# Patient Record
Sex: Male | Born: 1940 | Race: White | Hispanic: No | Marital: Married | Smoking: Former smoker
Health system: Southern US, Community
[De-identification: ages and names within clinical notes are randomized; demographics above are authoritative.]

## PROBLEM LIST (undated history)

## (undated) DIAGNOSIS — B351 Tinea unguium: Secondary | ICD-10-CM

## (undated) DIAGNOSIS — D649 Anemia, unspecified: Secondary | ICD-10-CM

## (undated) DIAGNOSIS — R310 Gross hematuria: Secondary | ICD-10-CM

## (undated) DIAGNOSIS — E1140 Type 2 diabetes mellitus with diabetic neuropathy, unspecified: Secondary | ICD-10-CM

## (undated) DIAGNOSIS — G4730 Sleep apnea, unspecified: Secondary | ICD-10-CM

## (undated) DIAGNOSIS — F329 Major depressive disorder, single episode, unspecified: Secondary | ICD-10-CM

## (undated) DIAGNOSIS — B359 Dermatophytosis, unspecified: Secondary | ICD-10-CM

## (undated) DIAGNOSIS — I10 Essential (primary) hypertension: Secondary | ICD-10-CM

## (undated) DIAGNOSIS — G20 Parkinson's disease: Secondary | ICD-10-CM

## (undated) DIAGNOSIS — E119 Type 2 diabetes mellitus without complications: Secondary | ICD-10-CM

## (undated) DIAGNOSIS — R0600 Dyspnea, unspecified: Secondary | ICD-10-CM

## (undated) DIAGNOSIS — L859 Epidermal thickening, unspecified: Secondary | ICD-10-CM

## (undated) DIAGNOSIS — F319 Bipolar disorder, unspecified: Secondary | ICD-10-CM

## (undated) DIAGNOSIS — F32A Depression, unspecified: Secondary | ICD-10-CM

## (undated) DIAGNOSIS — G20A1 Parkinson's disease without dyskinesia, without mention of fluctuations: Secondary | ICD-10-CM

## (undated) DIAGNOSIS — M109 Gout, unspecified: Secondary | ICD-10-CM

## (undated) DIAGNOSIS — C61 Malignant neoplasm of prostate: Secondary | ICD-10-CM

## (undated) DIAGNOSIS — E785 Hyperlipidemia, unspecified: Secondary | ICD-10-CM

## (undated) DIAGNOSIS — K529 Noninfective gastroenteritis and colitis, unspecified: Secondary | ICD-10-CM

## (undated) DIAGNOSIS — M1990 Unspecified osteoarthritis, unspecified site: Secondary | ICD-10-CM

## (undated) DIAGNOSIS — R251 Tremor, unspecified: Secondary | ICD-10-CM

## (undated) DIAGNOSIS — K635 Polyp of colon: Secondary | ICD-10-CM

## (undated) HISTORY — DX: Dermatophytosis, unspecified: B35.9

## (undated) HISTORY — DX: Bipolar disorder, unspecified: F31.9

## (undated) HISTORY — DX: Hyperlipidemia, unspecified: E78.5

## (undated) HISTORY — DX: Type 2 diabetes mellitus without complications: E11.9

## (undated) HISTORY — DX: Noninfective gastroenteritis and colitis, unspecified: K52.9

## (undated) HISTORY — DX: Tinea unguium: B35.1

## (undated) HISTORY — DX: Gross hematuria: R31.0

## (undated) HISTORY — DX: Polyp of colon: K63.5

## (undated) HISTORY — DX: Sleep apnea, unspecified: G47.30

## (undated) HISTORY — DX: Type 2 diabetes mellitus with diabetic neuropathy, unspecified: E11.40

## (undated) HISTORY — DX: Epidermal thickening, unspecified: L85.9

## (undated) HISTORY — DX: Unspecified osteoarthritis, unspecified site: M19.90

## (undated) HISTORY — DX: Tremor, unspecified: R25.1

## (undated) HISTORY — PX: FOOT SURGERY: SHX648

## (undated) HISTORY — PX: PROSTATE SURGERY: SHX751

## (undated) HISTORY — DX: Parkinson's disease without dyskinesia, without mention of fluctuations: G20.A1

## (undated) HISTORY — DX: Dyspnea, unspecified: R06.00

## (undated) HISTORY — DX: Parkinson's disease: G20

## (undated) HISTORY — DX: Essential (primary) hypertension: I10

## (undated) HISTORY — DX: Gout, unspecified: M10.9

## (undated) HISTORY — DX: Depression, unspecified: F32.A

## (undated) HISTORY — DX: Major depressive disorder, single episode, unspecified: F32.9

## (undated) HISTORY — PX: COLONOSCOPY W/ BIOPSIES: SHX1374

## (undated) HISTORY — PX: OTHER SURGICAL HISTORY: SHX169

## (undated) HISTORY — DX: Anemia, unspecified: D64.9

## (undated) HISTORY — PX: TONSILLECTOMY: SUR1361

## (undated) HISTORY — DX: Malignant neoplasm of prostate: C61

---

## 2004-04-03 ENCOUNTER — Encounter: Payer: Self-pay | Admitting: Urology

## 2004-09-19 ENCOUNTER — Ambulatory Visit: Payer: Self-pay | Admitting: Radiation Oncology

## 2004-10-01 ENCOUNTER — Ambulatory Visit: Payer: Self-pay | Admitting: Radiation Oncology

## 2005-06-07 ENCOUNTER — Ambulatory Visit: Payer: Self-pay | Admitting: Unknown Physician Specialty

## 2005-09-19 ENCOUNTER — Ambulatory Visit: Payer: Self-pay | Admitting: Radiation Oncology

## 2005-10-01 ENCOUNTER — Ambulatory Visit: Payer: Self-pay | Admitting: Radiation Oncology

## 2005-11-13 ENCOUNTER — Ambulatory Visit: Payer: Self-pay | Admitting: Ophthalmology

## 2005-11-19 ENCOUNTER — Ambulatory Visit: Payer: Self-pay | Admitting: Ophthalmology

## 2006-02-01 DIAGNOSIS — R251 Tremor, unspecified: Secondary | ICD-10-CM | POA: Insufficient documentation

## 2006-09-18 ENCOUNTER — Ambulatory Visit: Payer: Self-pay | Admitting: Radiation Oncology

## 2006-10-02 ENCOUNTER — Ambulatory Visit: Payer: Self-pay | Admitting: Radiation Oncology

## 2007-09-24 ENCOUNTER — Ambulatory Visit: Payer: Self-pay | Admitting: Radiation Oncology

## 2007-10-02 ENCOUNTER — Ambulatory Visit: Payer: Self-pay | Admitting: Radiation Oncology

## 2008-09-01 ENCOUNTER — Ambulatory Visit: Payer: Self-pay | Admitting: Radiation Oncology

## 2008-09-19 ENCOUNTER — Ambulatory Visit: Payer: Self-pay | Admitting: Radiation Oncology

## 2008-10-01 ENCOUNTER — Ambulatory Visit: Payer: Self-pay | Admitting: Radiation Oncology

## 2009-07-03 ENCOUNTER — Ambulatory Visit: Payer: Self-pay | Admitting: Ophthalmology

## 2009-07-11 ENCOUNTER — Ambulatory Visit: Payer: Self-pay | Admitting: Ophthalmology

## 2009-09-01 ENCOUNTER — Ambulatory Visit: Payer: Self-pay | Admitting: Radiation Oncology

## 2009-09-19 ENCOUNTER — Ambulatory Visit: Payer: Self-pay | Admitting: Radiation Oncology

## 2009-10-01 ENCOUNTER — Ambulatory Visit: Payer: Self-pay | Admitting: Radiation Oncology

## 2009-11-24 ENCOUNTER — Emergency Department: Payer: Self-pay | Admitting: Emergency Medicine

## 2009-11-24 ENCOUNTER — Ambulatory Visit: Payer: Self-pay | Admitting: Internal Medicine

## 2009-12-29 ENCOUNTER — Ambulatory Visit: Payer: Self-pay | Admitting: Internal Medicine

## 2010-03-02 ENCOUNTER — Emergency Department: Payer: Self-pay | Admitting: Emergency Medicine

## 2010-03-23 ENCOUNTER — Emergency Department (HOSPITAL_COMMUNITY)
Admission: EM | Admit: 2010-03-23 | Discharge: 2010-03-23 | Payer: Self-pay | Source: Home / Self Care | Admitting: Emergency Medicine

## 2010-08-15 LAB — URINALYSIS, ROUTINE W REFLEX MICROSCOPIC
Bilirubin Urine: NEGATIVE
Leukocytes, UA: NEGATIVE
Nitrite: NEGATIVE
Specific Gravity, Urine: 1.008 (ref 1.005–1.030)
Urobilinogen, UA: 0.2 mg/dL (ref 0.0–1.0)

## 2010-08-15 LAB — URINE MICROSCOPIC-ADD ON

## 2010-09-20 ENCOUNTER — Ambulatory Visit: Payer: Self-pay | Admitting: Radiation Oncology

## 2010-10-02 ENCOUNTER — Ambulatory Visit: Payer: Self-pay | Admitting: Radiation Oncology

## 2011-10-24 ENCOUNTER — Ambulatory Visit: Payer: Self-pay | Admitting: Internal Medicine

## 2011-11-01 ENCOUNTER — Ambulatory Visit: Payer: Self-pay | Admitting: Unknown Physician Specialty

## 2011-11-06 LAB — PATHOLOGY REPORT

## 2011-11-12 ENCOUNTER — Ambulatory Visit: Payer: Self-pay | Admitting: Internal Medicine

## 2011-11-12 LAB — CBC CANCER CENTER
Basophil: 1 %
Comment - H1-Com1: NORMAL
Eosinophil #: 0.2 x10 3/mm (ref 0.0–0.7)
HCT: 35.8 % — ABNORMAL LOW (ref 40.0–52.0)
Lymphocyte %: 14 %
Neutrophil #: 6.2 x10 3/mm (ref 1.4–6.5)
Neutrophil %: 77.5 %
Platelet: 160 x10 3/mm (ref 150–440)
RDW: 12.6 % (ref 11.5–14.5)
Segmented Neutrophils: 74 %

## 2011-11-12 LAB — CREATININE, SERUM
Creatinine: 0.85 mg/dL (ref 0.60–1.30)
EGFR (Non-African Amer.): >60

## 2011-12-02 ENCOUNTER — Ambulatory Visit: Payer: Self-pay | Admitting: Internal Medicine

## 2012-01-02 ENCOUNTER — Ambulatory Visit: Payer: Self-pay | Admitting: Internal Medicine

## 2012-02-06 ENCOUNTER — Ambulatory Visit: Payer: Self-pay | Admitting: Podiatry

## 2012-06-05 ENCOUNTER — Ambulatory Visit: Payer: Self-pay | Admitting: Internal Medicine

## 2012-06-05 LAB — CBC CANCER CENTER
Basophil #: 0 10*3/uL
Basophil %: 0.4 %
Eosinophil #: 0.2 10*3/uL
Eosinophil %: 1.5 %
HCT: 38.2 % — ABNORMAL LOW
HGB: 13.5 g/dL
Lymphocyte %: 11.1 %
Lymphs Abs: 1.1 10*3/uL
MCH: 34 pg
MCHC: 35.4 g/dL
MCV: 96 fL
Monocyte #: 0.5 10*3/uL
Monocyte %: 4.7 %
Neutrophil #: 8.3 10*3/uL — ABNORMAL HIGH
Neutrophil %: 82.3 %
Platelet: 149 10*3/uL — ABNORMAL LOW
RBC: 3.98 10*6/uL — ABNORMAL LOW
RDW: 12.6 %
WBC: 10.1 10*3/uL

## 2012-06-05 LAB — LACTATE DEHYDROGENASE: LDH: 151 U/L

## 2012-06-05 LAB — CREATININE, SERUM
Creatinine: 1.02 mg/dL
EGFR (African American): >60
EGFR (Non-African Amer.): >60

## 2012-07-04 ENCOUNTER — Ambulatory Visit: Payer: Self-pay | Admitting: Internal Medicine

## 2012-08-01 ENCOUNTER — Ambulatory Visit: Payer: Self-pay | Admitting: Internal Medicine

## 2013-05-03 ENCOUNTER — Ambulatory Visit: Payer: Self-pay | Admitting: Internal Medicine

## 2013-06-03 ENCOUNTER — Ambulatory Visit: Payer: Self-pay | Admitting: Internal Medicine

## 2014-02-15 DIAGNOSIS — E119 Type 2 diabetes mellitus without complications: Secondary | ICD-10-CM | POA: Insufficient documentation

## 2014-02-28 DIAGNOSIS — R413 Other amnesia: Secondary | ICD-10-CM | POA: Insufficient documentation

## 2014-04-14 ENCOUNTER — Ambulatory Visit: Payer: Self-pay | Admitting: Neurology

## 2014-05-06 DIAGNOSIS — R0602 Shortness of breath: Secondary | ICD-10-CM | POA: Insufficient documentation

## 2014-05-10 ENCOUNTER — Ambulatory Visit: Payer: Self-pay | Admitting: Podiatry

## 2014-05-10 LAB — CBC WITH DIFFERENTIAL/PLATELET
Basophil #: 0 10*3/uL (ref 0.0–0.1)
Basophil %: 0.5 %
EOS PCT: 1.6 %
Eosinophil #: 0.1 10*3/uL (ref 0.0–0.7)
HCT: 36.3 % — ABNORMAL LOW (ref 40.0–52.0)
HGB: 12 g/dL — ABNORMAL LOW (ref 13.0–18.0)
LYMPHS ABS: 0.9 10*3/uL — AB (ref 1.0–3.6)
LYMPHS PCT: 11 %
MCH: 33.2 pg (ref 26.0–34.0)
MCHC: 33.1 g/dL (ref 32.0–36.0)
MCV: 100 fL (ref 80–100)
Monocyte #: 0.5 x10 3/mm (ref 0.2–1.0)
Monocyte %: 5.9 %
Neutrophil #: 6.7 10*3/uL — ABNORMAL HIGH (ref 1.4–6.5)
Neutrophil %: 81 %
Platelet: 137 10*3/uL — ABNORMAL LOW (ref 150–440)
RBC: 3.62 10*6/uL — AB (ref 4.40–5.90)
RDW: 13.5 % (ref 11.5–14.5)
WBC: 8.2 10*3/uL (ref 3.8–10.6)

## 2014-05-10 LAB — BASIC METABOLIC PANEL
Anion Gap: 4 — ABNORMAL LOW (ref 7–16)
BUN: 22 mg/dL — ABNORMAL HIGH (ref 7–18)
CALCIUM: 10 mg/dL (ref 8.5–10.1)
CHLORIDE: 108 mmol/L — AB (ref 98–107)
CO2: 28 mmol/L (ref 21–32)
CREATININE: 0.86 mg/dL (ref 0.60–1.30)
EGFR (Non-African Amer.): >60
GLUCOSE: 159 mg/dL — AB (ref 65–99)
OSMOLALITY: 286 (ref 275–301)
POTASSIUM: 4.8 mmol/L (ref 3.5–5.1)
Sodium: 140 mmol/L (ref 136–145)

## 2014-05-20 ENCOUNTER — Ambulatory Visit: Payer: Self-pay | Admitting: Podiatry

## 2014-07-28 ENCOUNTER — Ambulatory Visit: Payer: Self-pay

## 2014-08-08 ENCOUNTER — Ambulatory Visit: Payer: Self-pay | Admitting: Urology

## 2014-08-10 ENCOUNTER — Ambulatory Visit: Payer: Self-pay | Admitting: Urology

## 2014-09-28 NOTE — Op Note (Signed)
PATIENT NAME:  Craig Wilson, SERIO MR#:  [id number] DATE OF BIRTH:  1940-12-13  DATE OF PROCEDURE:  05/20/2014  PREOPERATIVE DIAGNOSIS: Exostosis interphalangeal joint distal phalanx, primarily right hallux.   POSTOPERATIVE DIAGNOSIS: Exostosis interphalangeal joint distal phalanx, primarily right hallux.   PROCEDURE: Excision of exostosis, right great toe.   SURGEON:  Gerrit Heck. Desmen Schoffstall, DPM  ASSISTANTS: None.  HISTORY OF PRESENT ILLNESS: The patient has had pain and discomfort to that toe for a number of years. It has progressively worsened. He is about 2 years status post similar procedure on his left foot with good results. Conservative treatment has proven ineffective, and he desires surgical correction.   ANESTHESIA: MAC with local sedation.   ESTIMATED BLOOD LOSS: Negligible.   HEMOSTASIS: Ankle tourniquet 250 mmHg pressure.   OPERATIVE REPORT: The patient was brought to the OR and placed on the OR table in the supine position. At this point, after monitored anesthesia care was delivered, the patient was then prepped and draped in the usual sterile manner. This was also following a localized block given by me, to the base of the great toe with 0.5% Marcaine plain. At this time,  attention was directed to the right foot, where the foot was prepped and draped in the usual sterile manner. Attention was then directed to the medial aspect of the right hallux at the IPJ level. A 2.5 cm incision was made along the medial aspect of the IP joint, just superior to a significant hyperkeratotic area along that region. This incision was deepened with sharp and blunt dissection. Bleeders were clamped and bovied as required. The capsular tissue overlying the joint was identified and incised longitudinally and freed away from the bone, both medially, dorsal medially, and plantar medially. At this point, a combination of sagittal saw and a power rasp were utilized to remove excess bone from the region. The  area was rasped smoothly and rongeured and rasped smoothly again. The area was checked and felt to be adequately reduced of the bony prominence to the region. There was then copiously irrigated. The capsular tissue was then closed with 3-0 Vicryl in a continuous stitch, as were deep and superficial fascial layers. The skin was closed  with 4-0 nylon in a horizontal mattress suture style. A sterile compressive dressing was placed across the wound, consisting of Xeroform gauze, 4 x 4's, Kling, and Kerlix. The patient's tourniquet was released at this timeframe, and prompt and complete vascularity were seen to return to the digits of the right foot. The patient appeared to tolerate the procedure and anesthesia well and left the OR for the recovery room with vital signs stable and neurovascular status intact.    ____________________________ Gerrit Heck. Yanina Knupp, DPM mgt:mw D: 05/20/2014 08:17:33 ET T: 05/20/2014 13:09:01 ET JOB#: [id number]  cc: Gerrit Heck Reka Wist, DPM, <Dictator> Perry Mount MD ELECTRONICALLY SIGNED 06/16/2014 12:51

## 2014-10-02 NOTE — Op Note (Signed)
PATIENT NAME:  Craig Wilson, Craig Wilson MR#:  [id number] DATE OF BIRTH:  02-Oct-1940  DATE OF PROCEDURE:  08/10/2014  PREOPERATIVE DIAGNOSIS: Gross hematuria.   POSTOPERATIVE DIAGNOSIS: Gross hematuria, bladder neck contracture.   PROCEDURES PERFORMED: Cystoscopy, bilateral retrograde pyelogram.   ATTENDING SURGEON: Sherlynn Stalls, MD.   ANESTHESIA: General.  ESTIMATED BLOOD LOSS: None.  COMPLICATIONS: None.   SPECIMENS: None.   INDICATION: This is a 74 year old male with a history of prostate cancer, status post prostatectomy and radiation. He has a sphincter placed. He developed gross hematuria and presents today for completion of his hematuria workup. He did have a CT urogram; however the ureters were not completely opacified, therefore, presents today for office cystoscopy and bilateral retrograde pyelogram to complete his workup. Risks and benefits of the procedure were explained in detail to the patient who agreed to proceed as planned.   DETAILS OF PROCEDURE: The patient was correctly identified in the preoperative holding area and informed consent was confirmed. He was brought to the operating suite and placed on the table in the supine position. At this time, universal timeout protocol was performed. All team members were identified. Venodyne boots were placed and he was administered IV ampicillin and gentamicin in the perioperative period. He was then placed under general anesthesia, and prepped and draped in the standard surgical fashion. Given his history of an artificial urinary sphincter, I elected not to proceed with rigid cystoscopy, rather with a 13 French flexible cystoscope in order to minimize trauma to his sphincter. I started the case by currently permanently deactivating his sphincter, which appeared to be functioning normally. At this point in time, I was able to pass a 64 French flexible cystoscope easily beyond the sphincter, which was widely patent, into the bladder. Of  note, there was a fairly narrow bladder neck contracture and I did have to push across a small amount of resistance at the bladder neck to get the scope within the bladder. The bladder neck size barely was able to accommodate the flexible cystoscope. Once inside the bladder, careful inspection of the mucosa was performed, which revealed no evidence of any erythema, lesions, or ulcerations. There were no bladder tumors or stones identified. There was minimal to no bladder trabeculation. The trigone appeared normal with normal bilateral ureteral orifices and orthotopic position and clear efflux of urine from each. Using a Sensor wire and a 5 [place] open-ended through the flexible cystoscope, the attention was turned to the right ureteral orifice. The wire was passed just within the distal ureter and the 5 [place] was advanced over that just within the UO. The wire was removed and contrast was injected through the catheter. A retrograde pyelogram revealed a normal caliber ureter and normal collecting system. Of note, he did have a fairly long infundibula. There were no filling defects throughout the entire collecting system or evidence of hydronephrosis. The same exact procedure was then performed on the left side, which, again, revealed no evidence of filling defects or hydronephrosis. At this point in time, the scope was carefully backed out through the bladder neck. Of note, the prostate was surgically absent and the scope was removed. The device was then reactivated. The scope was advanced back to the level of the urethra where the sphincter could be seen, and there was adequate coaptation. I went ahead and cycled the cuff to ensure that it was functioning properly and the cuff did open widely in order to accommodate the scope, and cycle closed in normal fashion.  The scope was removed. The cuff was cycled once last time and a Crede maneuver was used the patient's bladder. The patient was then repositioned in the  supine position, reversed from anesthesia, and taken to the PACU in stable condition. There were no complications in this case.   ____________________________ Sherlynn Stalls, MD ajb:TM D: 08/10/2014 13:00:46 ET T: 08/10/2014 22:44:34 ET JOB#: [id number]  cc: Sherlynn Stalls, MD, <Dictator> Sherlynn Stalls MD ELECTRONICALLY SIGNED 09/13/2014 17:41

## 2014-10-10 DIAGNOSIS — R262 Difficulty in walking, not elsewhere classified: Secondary | ICD-10-CM | POA: Insufficient documentation

## 2014-10-10 DIAGNOSIS — G479 Sleep disorder, unspecified: Secondary | ICD-10-CM | POA: Insufficient documentation

## 2014-11-15 ENCOUNTER — Encounter: Payer: Self-pay | Admitting: Family Medicine

## 2014-11-15 ENCOUNTER — Other Ambulatory Visit: Payer: Self-pay | Admitting: Urology

## 2014-11-23 DIAGNOSIS — G20 Parkinson's disease: Secondary | ICD-10-CM | POA: Insufficient documentation

## 2014-12-16 ENCOUNTER — Encounter: Payer: Self-pay | Admitting: Cardiovascular Disease

## 2014-12-16 ENCOUNTER — Ambulatory Visit (INDEPENDENT_AMBULATORY_CARE_PROVIDER_SITE_OTHER): Payer: Medicare Other | Admitting: Cardiovascular Disease

## 2014-12-16 VITALS — BP 124/60 | HR 53 | Ht 70.0 in | Wt 180.2 lb

## 2014-12-16 DIAGNOSIS — R0602 Shortness of breath: Secondary | ICD-10-CM | POA: Diagnosis not present

## 2014-12-16 DIAGNOSIS — G20 Parkinson's disease: Secondary | ICD-10-CM | POA: Insufficient documentation

## 2014-12-16 DIAGNOSIS — R2681 Unsteadiness on feet: Secondary | ICD-10-CM | POA: Insufficient documentation

## 2014-12-16 DIAGNOSIS — Z8249 Family history of ischemic heart disease and other diseases of the circulatory system: Secondary | ICD-10-CM | POA: Insufficient documentation

## 2014-12-16 DIAGNOSIS — E785 Hyperlipidemia, unspecified: Secondary | ICD-10-CM | POA: Insufficient documentation

## 2014-12-16 DIAGNOSIS — R079 Chest pain, unspecified: Secondary | ICD-10-CM | POA: Insufficient documentation

## 2014-12-16 DIAGNOSIS — I10 Essential (primary) hypertension: Secondary | ICD-10-CM | POA: Insufficient documentation

## 2014-12-16 NOTE — Assessment & Plan Note (Signed)
Related to inactivity, leg weakness, Parkinson's

## 2014-12-16 NOTE — Assessment & Plan Note (Signed)
Father died in his 26s. Coronary calcium score ordered for risk stratification

## 2014-12-16 NOTE — Progress Notes (Signed)
Patient ID: Craig Wilson, male    DOB: 07/21/1940, 74 y.o.   MRN: [id number]  HPI Comments: Craig Wilson is a very pleasant 74 year old gentleman with history of Parkinson's, hypertension, diabetes, prostate cancer with prior radiation treatment, hyperlipidemia who presents for evaluation of shortness of breath and risk stratification for coronary artery disease.  He reports that he has shortness of breath with stairs and hills. He does okay on flat surfaces. Not very active at baseline given his Parkinson's. No regular exercise program. He is concerned about underlying coronary artery disease  There is a strong family history. Father had MI in his 61s, mother had stroke in her 45s Brother has cancer  He does report occasional dizziness, ankle edema, not severe Review of blood work shows drop in his blood count, hematocrit 33 She does report some chronic diarrhea at times.  Blood pressure typically well controlled at home on his current medications  EKG on today's visit shows normal sinus rhythm with sinus bradycardia, rate 53 bpm, right bundle branch block   Allergies  Allergen Reactions  . Latex   . Vesicare [Solifenacin]     Current Outpatient Prescriptions on File Prior to Visit  Medication Sig Dispense Refill  . aspirin 81 MG tablet Take 81 mg by mouth daily.    Marland Kitchen atenolol (TENORMIN) 25 MG tablet Take 25 mg by mouth daily.    Marland Kitchen atorvastatin (LIPITOR) 40 MG tablet Take 40 mg by mouth daily.    Marland Kitchen azelastine (ASTELIN) 0.1 % nasal spray Place 1 spray into both nostrils 2 (two) times daily. Use in each nostril as directed    . glucose blood test strip 1 each by Other route as needed for other. Use as instructed    . lithium carbonate (LITHOBID) 300 MG CR tablet Take 300 mg by mouth 2 (two) times daily.    Marland Kitchen losartan (COZAAR) 50 MG tablet Take 50 mg by mouth daily.    . metFORMIN (GLUCOPHAGE) 500 MG tablet Take 500 mg by mouth 2 (two) times daily with a meal.    . MICROLET  LANCETS MISC by Does not apply route.     No current facility-administered medications on file prior to visit.    Past Medical History  Diagnosis Date  . Sleep apnea   . Dyspnea   . Anemia   . Chronic diarrhea   . Type 2 diabetes mellitus   . Prostate cancer   . Bipolar disorder   . Colon polyps   . Tremor   . HLD (hyperlipidemia)   . HTN (hypertension)   . Depression   . Dermatophytosis   . Gout   . DJD (degenerative joint disease)   . Hyperkeratosis   . Onychomycosis   . Diabetic neuropathy   . Gross hematuria     Past Surgical History  Procedure Laterality Date  . Prostate surgery    . Status post implantation of artificial urinary sphincter     . Colonoscopy w/ biopsies    . Foot surgery    . Tonsillectomy    . Cataract surgery Bilateral     Social History  reports that he quit smoking about 15 years ago. He does not have any smokeless tobacco history on file. He reports that he does not drink alcohol.  Family History family history includes Heart attack (age of onset: 86) in his father; Heart disease in his mother; Hyperlipidemia in his father; Hypertension in his father. There is no history of Kidney cancer,  Prostate cancer, or Bladder Cancer.   Review of Systems  Respiratory: Positive for shortness of breath.   Cardiovascular: Negative.   Gastrointestinal: Negative.   Musculoskeletal: Positive for gait problem.  Neurological: Positive for weakness.  Hematological: Negative.   Psychiatric/Behavioral: Negative.   All other systems reviewed and are negative.   BP 124/60 mmHg  Pulse 53  Ht 5\' 10"  (1.778 m)  Wt 180 lb 4 oz (81.761 kg)  BMI 25.86 kg/m2  Physical Exam  Constitutional: He is oriented to person, place, and time. He appears well-developed and well-nourished.  HENT:  Head: Normocephalic.  Nose: Nose normal.  Mouth/Throat: Oropharynx is clear and moist.  Eyes: Conjunctivae are normal. Pupils are equal, round, and reactive to light.   Neck: Normal range of motion. Neck supple. No JVD present.  Cardiovascular: Normal rate, regular rhythm, S1 normal, S2 normal, normal heart sounds and intact distal pulses.  Exam reveals no gallop and no friction rub.   No murmur heard. Pulmonary/Chest: Effort normal and breath sounds normal. No respiratory distress. He has no wheezes. He has no rales. He exhibits no tenderness.  Abdominal: Soft. Bowel sounds are normal. He exhibits no distension. There is no tenderness.  Musculoskeletal: Normal range of motion. He exhibits no edema or tenderness.  Lymphadenopathy:    He has no cervical adenopathy.  Neurological: He is alert and oriented to person, place, and time. Coordination normal.  Skin: Skin is warm and dry. No rash noted. No erythema.  Psychiatric: He has a normal mood and affect. His behavior is normal. Judgment and thought content normal.      Assessment and Plan   Nursing note and vitals reviewed.

## 2014-12-16 NOTE — Assessment & Plan Note (Signed)
Currently not on Sinemet. Recommended a regular exercise program

## 2014-12-16 NOTE — Assessment & Plan Note (Signed)
Currently on generic Lipitor. Cholesterol is at goal on the current lipid regimen. No changes to the medications were made.

## 2014-12-16 NOTE — Assessment & Plan Note (Signed)
Blood pressure is well controlled on today's visit. No changes made to the medications. 

## 2014-12-16 NOTE — Assessment & Plan Note (Signed)
Etiology of the shortness of breath is unclear. No valve issues on clinical exam, no indication of decompensated heart failure. He does report having prior workup with outside cardiologist several years ago for similar symptoms. Possibly related to inactivity, deconditioning, Parkinson's. Unable to exclude underlying coronary artery disease. We have discussed various options. We have recommended a coronary calcium score given his family history, and symptoms

## 2014-12-16 NOTE — Patient Instructions (Addendum)
You are doing well. No medication changes were made.  We will order a CT coronary calcium score  You are scheduled for Thursday, July 21 @ 2:30 Please arrive at @ 2:15 There is a one time fee of $150.00 due at the time of your procedure  Please call us if you have new issues that need to be addressed before your next appt.  Your physician wants you to follow-up in: 12 months.  You will receive a reminder letter in the mail two months in advance. If you don't receive a letter, please call our office to schedule the follow-up appointment.

## 2014-12-22 ENCOUNTER — Ambulatory Visit (INDEPENDENT_AMBULATORY_CARE_PROVIDER_SITE_OTHER)
Admission: RE | Admit: 2014-12-22 | Discharge: 2014-12-22 | Disposition: A | Discharge status: Home / Self Care | Payer: Medicare Other | Source: Ambulatory Visit | Attending: Cardiovascular Disease | Admitting: Cardiovascular Disease

## 2014-12-22 DIAGNOSIS — R079 Chest pain, unspecified: Secondary | ICD-10-CM

## 2014-12-22 DIAGNOSIS — Z8249 Family history of ischemic heart disease and other diseases of the circulatory system: Secondary | ICD-10-CM

## 2014-12-22 DIAGNOSIS — R0602 Shortness of breath: Secondary | ICD-10-CM

## 2014-12-30 ENCOUNTER — Other Ambulatory Visit: Payer: Self-pay | Admitting: Neurology

## 2014-12-30 DIAGNOSIS — R42 Dizziness and giddiness: Secondary | ICD-10-CM

## 2014-12-30 DIAGNOSIS — R251 Tremor, unspecified: Secondary | ICD-10-CM

## 2014-12-30 DIAGNOSIS — R413 Other amnesia: Secondary | ICD-10-CM

## 2015-01-05 ENCOUNTER — Ambulatory Visit: Payer: Medicare Other

## 2015-01-05 ENCOUNTER — Telehealth: Payer: Self-pay | Admitting: *Deleted

## 2015-01-05 NOTE — Telephone Encounter (Signed)
Patient would like results from ct.

## 2015-01-12 ENCOUNTER — Ambulatory Visit
Admission: RE | Admit: 2015-01-12 | Discharge: 2015-01-12 | Disposition: A | Discharge status: Home / Self Care | Payer: Medicare Other | Source: Ambulatory Visit | Attending: Neurology | Admitting: Neurology

## 2015-01-12 DIAGNOSIS — R251 Tremor, unspecified: Secondary | ICD-10-CM

## 2015-01-12 DIAGNOSIS — R413 Other amnesia: Secondary | ICD-10-CM | POA: Insufficient documentation

## 2015-01-12 DIAGNOSIS — R42 Dizziness and giddiness: Secondary | ICD-10-CM | POA: Diagnosis present

## 2015-01-12 MED ORDER — GADOBENATE DIMEGLUMINE 529 MG/ML IV SOLN
20.0000 mL | Freq: Once | INTRAVENOUS | Status: AC | PRN
  Administered 2015-01-12: 16 mL via INTRAVENOUS

## 2015-01-19 ENCOUNTER — Ambulatory Visit (INDEPENDENT_AMBULATORY_CARE_PROVIDER_SITE_OTHER): Payer: Medicare Other

## 2015-01-19 DIAGNOSIS — R339 Retention of urine, unspecified: Secondary | ICD-10-CM | POA: Diagnosis not present

## 2015-01-19 LAB — URINALYSIS, COMPLETE
BILIRUBIN UA: NEGATIVE
GLUCOSE, UA: NEGATIVE
KETONES UA: NEGATIVE
Leukocytes, UA: NEGATIVE
Nitrite, UA: NEGATIVE
PH UA: 7 (ref 5.0–7.5)
PROTEIN UA: NEGATIVE
Specific Gravity, UA: 1.01 (ref 1.005–1.030)
UUROB: 1 mg/dL (ref 0.2–1.0)

## 2015-01-19 LAB — MICROSCOPIC EXAMINATION
BACTERIA UA: NONE SEEN
Epithelial Cells (non renal): NONE SEEN /hpf (ref 0–10)

## 2015-01-19 NOTE — Progress Notes (Signed)
Patient came to the office today stating that he has been having trouble voiding, having to strain at times and some dribbling. Patient has an artificial sphincter and states that he is not having problems with the sphincter, but did experience and episode of gross hematuria a few weeks ago and had some discomfort when he was trying to deflate and accidentally "pinched something" but since has not had any problems. He states he has had some dark urine the past week or so, after a discussion about his water intake he may be slightly dehydrated and was instructed to drink more water up to 1-2 liters daily if possible. A urine sample and bladder scan was preformed to rule out infection and urinary retention.  PVR volume: 66ml (which could possible represent his reservoir) Patient was told to follow up with a provider and was made an apt before he left.

## 2015-01-25 ENCOUNTER — Encounter: Payer: Self-pay | Admitting: *Deleted

## 2015-01-26 ENCOUNTER — Other Ambulatory Visit: Payer: Medicare Other

## 2015-01-26 DIAGNOSIS — R310 Gross hematuria: Secondary | ICD-10-CM

## 2015-01-26 LAB — MICROSCOPIC EXAMINATION
BACTERIA UA: NONE SEEN
EPITHELIAL CELLS (NON RENAL): NONE SEEN /HPF (ref 0–10)

## 2015-01-26 LAB — URINALYSIS, COMPLETE
BILIRUBIN UA: NEGATIVE
Glucose, UA: NEGATIVE
KETONES UA: NEGATIVE
Nitrite, UA: NEGATIVE
PH UA: 7 (ref 5.0–7.5)
PROTEIN UA: NEGATIVE
SPEC GRAV UA: 1.01 (ref 1.005–1.030)
Urobilinogen, Ur: 0.2 mg/dL (ref 0.2–1.0)

## 2015-01-26 NOTE — Addendum Note (Signed)
Addended by: Orlene Erm on: 01/26/2015 04:09 PM   Modules accepted: Orders

## 2015-01-29 LAB — CULTURE, URINE COMPREHENSIVE

## 2015-01-30 ENCOUNTER — Telehealth: Payer: Self-pay

## 2015-01-30 NOTE — Telephone Encounter (Signed)
-----   Message from Nori Riis, PA-C sent at 01/29/2015 10:13 PM EDT ----- Urine culture is negative.  Please tell the patient.

## 2015-01-30 NOTE — Telephone Encounter (Signed)
LMOM

## 2015-01-31 NOTE — Telephone Encounter (Signed)
LMOM

## 2015-01-31 NOTE — Telephone Encounter (Signed)
-----   Message from Shannon A McGowan, PA-C sent at 01/29/2015 10:13 PM EDT ----- Urine culture is negative.  Please tell the patient. 

## 2015-01-31 NOTE — Telephone Encounter (Signed)
Spoke with pt and made aware of negative urine cx. Pt voiced understanding.

## 2015-02-01 ENCOUNTER — Encounter: Payer: Self-pay | Admitting: Urology

## 2015-02-01 ENCOUNTER — Ambulatory Visit (INDEPENDENT_AMBULATORY_CARE_PROVIDER_SITE_OTHER): Payer: Medicare Other | Admitting: Urology

## 2015-02-01 VITALS — BP 136/65 | HR 55 | Ht 69.0 in | Wt 169.7 lb

## 2015-02-01 DIAGNOSIS — Z8546 Personal history of malignant neoplasm of prostate: Secondary | ICD-10-CM

## 2015-02-01 DIAGNOSIS — R32 Unspecified urinary incontinence: Secondary | ICD-10-CM | POA: Diagnosis not present

## 2015-02-01 DIAGNOSIS — R310 Gross hematuria: Secondary | ICD-10-CM | POA: Diagnosis not present

## 2015-02-01 LAB — URINALYSIS, COMPLETE
BILIRUBIN UA: NEGATIVE
Glucose, UA: NEGATIVE
KETONES UA: NEGATIVE
Leukocytes, UA: NEGATIVE
Nitrite, UA: NEGATIVE
PH UA: 7 (ref 5.0–7.5)
Protein, UA: NEGATIVE
RBC UA: NEGATIVE
SPEC GRAV UA: 1.01 (ref 1.005–1.030)
UUROB: 0.2 mg/dL (ref 0.2–1.0)

## 2015-02-01 LAB — MICROSCOPIC EXAMINATION
Bacteria, UA: NONE SEEN
EPITHELIAL CELLS (NON RENAL): NONE SEEN /HPF (ref 0–10)
RBC MICROSCOPIC, UA: NONE SEEN /HPF (ref 0–?)

## 2015-02-01 LAB — BLADDER SCAN AMB NON-IMAGING: SCAN RESULT: 0

## 2015-02-01 NOTE — Progress Notes (Signed)
02/01/2015 3:28 PM   Craig Wilson [birth year] [id number]  Referring provider: Adin Hector, MD [place], [place] [address]  Chief Complaint  Patient presents with  . Urinary Retention    recheck    HPI: Patient is a 74 year old white male with a history of prostate cancer and gross hematuria who presents today to discuss the worsening of his urinary incontinence.  Urinary incontinence Patient has been experiencing spontaneous incontinence since he underwent cystoscopy with retrogrades in March.  He states when he is standing in front of the toilet the urine just dumps out, sitting down to have a bowel movement the urine just dumps out and before point his pants down the urine dumps out and he wets his underpants.  He is also experiencing frequent urination, hard to postpone urination and getting up at night to urinate.  He did have an AUS placed at [facility] in 2006.  He states he is not having any difficulty with the AUS at this time. He is wondering if it is still working effectively.  His urine culture from 01/26/2015 was negative.  Prostate cancer Patient underwent RRP by Dr. Bernardo Heater, radiation by Dr. Donella Stade, and an AUS placed at [facility] in 2006.  I do not have the patient's pathology or surgical records. His most recent PSA was 0.01 ng/mL on 08/18/2014.  Gross hematuria Patient underwent a hematuria workup with CT urogram and cystoscopy with bilateral retrogrades. No malignancy was discovered. Patient has had no further episodes of gross hematuria. His urinalysis today is unremarkable.  PMH: Past Medical History  Diagnosis Date  . Sleep apnea   . Dyspnea   . Anemia   . Chronic diarrhea   . Type 2 diabetes mellitus   . Prostate cancer   . Bipolar disorder   . Colon polyps   . Tremor   . HLD (hyperlipidemia)   . HTN (hypertension)   . Depression   . Dermatophytosis   . Gout   . DJD (degenerative joint disease)   . Hyperkeratosis   . Onychomycosis     . Diabetic neuropathy   . Gross hematuria     Surgical History: Past Surgical History  Procedure Laterality Date  . Prostate surgery      removed  . Status post implantation of artificial urinary sphincter     . Colonoscopy w/ biopsies    . Foot surgery    . Tonsillectomy    . Cataract surgery Bilateral     Home Medications:    Medication List       This list is accurate as of: 02/01/15  3:28 PM.  Always use your most recent med list.               aspirin 81 MG tablet  Take 81 mg by mouth daily.     atenolol 25 MG tablet  Commonly known as:  TENORMIN  Take 25 mg by mouth daily.     atorvastatin 40 MG tablet  Commonly known as:  LIPITOR  Take 40 mg by mouth daily.     azelastine 0.1 % nasal spray  Commonly known as:  ASTELIN  Place 1 spray into both nostrils 2 (two) times daily. Use in each nostril as directed     colchicine 0.6 MG tablet  Take 1 tablet by mouth at first sign of gout flare followed by 1 tablet (0.6mg ) after 1 hour if no relief. (Max 1.8mg  in 1 day)  glucose blood test strip  1 each by Other route as needed for other. Use as instructed     lithium carbonate 300 MG CR tablet  Commonly known as:  LITHOBID  Take 300 mg by mouth 2 (two) times daily.     losartan 50 MG tablet  Commonly known as:  COZAAR  Take 50 mg by mouth daily.     metFORMIN 500 MG tablet  Commonly known as:  GLUCOPHAGE  Take 500 mg by mouth 2 (two) times daily with a meal.     MICROLET LANCETS Misc  by Does not apply route.     rOPINIRole 0.5 MG tablet  Commonly known as:  REQUIP  TK 1 T PO Q NIGHT FOR 1 WEEK THEN TK 2 TS PO Q NIGHT     selegiline 5 MG capsule  Commonly known as:  ELDEPRYL  Take by mouth.     traZODone 50 MG tablet  Commonly known as:  DESYREL  TAKE 1 TO 2 TABLETS BY MOUTH EVERY NIGHT AT BEDTIME AS NEEDED FOR SLEEP     Vitamin D (Cholecalciferol) 1000 UNITS Tabs  Take 1,000 Units by mouth daily.     vitamin E 100 UNIT capsule  Take 100  Units by mouth daily.        Allergies:  Allergies  Allergen Reactions  . Carbidopa W-Levodopa Other (See Comments)    irrational behavior , felt zombie like  . Latex   . Vesicare [Solifenacin]     Family History: Family History  Problem Relation Age of Onset  . Kidney cancer Neg Hx   . Prostate cancer Neg Hx   . Bladder Cancer Neg Hx   . Heart disease Mother   . Heart attack Father 72  . Hypertension Father   . Hyperlipidemia Father     Social History:  reports that he quit smoking about 15 years ago. He does not have any smokeless tobacco history on file. He reports that he does not drink alcohol or use illicit drugs.  ROS: UROLOGY Frequent Urination?: Yes Hard to postpone urination?: Yes Burning/pain with urination?: No Get up at night to urinate?: Yes Leakage of urine?: Yes Urine stream starts and stops?: No Trouble starting stream?: No Do you have to strain to urinate?: No Blood in urine?: No Urinary tract infection?: No Sexually transmitted disease?: No Injury to kidneys or bladder?: No Painful intercourse?: No Weak stream?: No Erection problems?: No Penile pain?: No  Gastrointestinal Nausea?: No Vomiting?: No Indigestion/heartburn?: No Diarrhea?: Yes Constipation?: No  Constitutional Fever: No Night sweats?: No Weight loss?: No Fatigue?: No  Skin Skin rash/lesions?: No Itching?: No  Eyes Blurred vision?: No Double vision?: No  Ears/Nose/Throat Sore throat?: No Sinus problems?: No  Hematologic/Lymphatic Swollen glands?: No Easy bruising?: Yes  Cardiovascular Leg swelling?: No Chest pain?: No  Respiratory Cough?: Yes Shortness of breath?: Yes  Endocrine Excessive thirst?: No  Musculoskeletal Back pain?: No Joint pain?: No  Neurological Headaches?: No Dizziness?: No  Psychologic Depression?: No Anxiety?: No  Physical Exam: BP 136/65 mmHg  Pulse 55  Ht 5\' 9"  (1.753 m)  Wt 169 lb 11.2 oz (76.975 kg)  BMI 25.05  kg/m2  GU: Patient with circumcised phallus.   Urethral meatus is patent.  No penile discharge. No penile lesions or rashes. Scrotum without lesions, cysts, rashes and/or edema.  Testicles are located scrotally bilaterally. No masses are appreciated in the testicles. Left and right epididymis are normal. Rectal: Patient with  normal sphincter tone.  Perineum without scarring or rashes. No rectal masses are appreciated. Prostate is surgically absent.     Laboratory Data: Lab Results  Component Value Date   WBC 8.2 05/10/2014   HGB 12.0* 05/10/2014   HCT 36.3* 05/10/2014   MCV 100 05/10/2014   PLT 137* 05/10/2014    Lab Results  Component Value Date   CREATININE 0.86 05/10/2014    Lab Results  Component Value Date   PSA  09/20/2010    ========== TEST NAME ==========  ========= RESULTS =========  = REFERENCE RANGE =  PROSTATE-SPECIFIC AG.  PSA, Serum (Serial Monitor) Prostate Specific Ag, Serum     [   <0.1 ng/mL           ]           0.0-4.0 Roche ECLIA methodology.                                                                      . According to the American Urological Association, Serum PSA should decrease and remain at undetectable levels after radical prostatectomy. The AUA defines biochemical recurrence as an initial PSA value 0.2 ng/mL or greater followed by a subsequent confirmatory PSA value 0.2 ng/mL or greater. Values obtained with different assay methods or kits cannot be used interchangeably. Results cannot be interpreted as absolute evidence of the presence or absence of malignant disease.               [facility]            No: [id number]           [address], [place], [place] [address]           Lindon Romp, MD         [phone]   Result(s) reported on 21 Sep 2010 at 12:50PM.    PSA history:  0.01 ng/mL on 02/15/2014  0.01 ng/mL on 08/18/2014  Assessment & Plan:    1. Incontinence:   Patient is worried that his AUS is  malfunctioning. He would like an appointment with one of our physicians for further evaluation of his sphincter.  His PVR is minimal.  He is diabetic, but his hemoglobin A1c is within normal limits and his UA is negative for glucose.  He also denies polydipsia.  - Urinalysis, Complete - BLADDER SCAN AMB NON-IMAGING  2. Prostate cancer:   Patient underwent a RRP with adjunctive radiation in 2005. His most recent PSA was 0.01 on 08/18/2014. His PSA's are followed annually by his primary care physician. He had no rectal masses on today's DRE.  3. Gross hematuria:   Patient has completed a hematuria workup with CT urogram and cystoscopy with bilateral retrograde. No GU malignancies were found. He has not had any further gross hematuria. There is no microscopic hematuria on today's UA.  We will continue to monitor with yearly UA's.   No Follow-up on file.  Zara Council, [facility] [address], [place], [place] [address] [phone]

## 2015-02-13 ENCOUNTER — Telehealth: Payer: Self-pay

## 2015-02-13 NOTE — Telephone Encounter (Signed)
Pt called after hours answering system. [facility] received pt message from answering system. Nurse returned pt call who stated "everything was a false alarm". Pt stated he is doing well now. Nurse reinforced with pt to give [place] a call back if he has anymore problems. Pt voiced understanding.

## 2015-02-23 ENCOUNTER — Ambulatory Visit: Payer: Medicare Other

## 2015-02-24 ENCOUNTER — Encounter: Payer: Self-pay | Admitting: Urology

## 2015-02-24 ENCOUNTER — Ambulatory Visit (INDEPENDENT_AMBULATORY_CARE_PROVIDER_SITE_OTHER): Payer: Medicare Other | Admitting: Urology

## 2015-02-24 VITALS — BP 152/62 | HR 56 | Ht 67.0 in | Wt 176.0 lb

## 2015-02-24 DIAGNOSIS — R32 Unspecified urinary incontinence: Secondary | ICD-10-CM | POA: Diagnosis not present

## 2015-02-24 DIAGNOSIS — K529 Noninfective gastroenteritis and colitis, unspecified: Secondary | ICD-10-CM | POA: Insufficient documentation

## 2015-02-24 DIAGNOSIS — M1990 Unspecified osteoarthritis, unspecified site: Secondary | ICD-10-CM | POA: Insufficient documentation

## 2015-02-24 DIAGNOSIS — C61 Malignant neoplasm of prostate: Secondary | ICD-10-CM | POA: Diagnosis not present

## 2015-02-24 DIAGNOSIS — D369 Benign neoplasm, unspecified site: Secondary | ICD-10-CM | POA: Insufficient documentation

## 2015-02-24 DIAGNOSIS — D649 Anemia, unspecified: Secondary | ICD-10-CM | POA: Insufficient documentation

## 2015-02-24 DIAGNOSIS — E1140 Type 2 diabetes mellitus with diabetic neuropathy, unspecified: Secondary | ICD-10-CM | POA: Insufficient documentation

## 2015-02-24 DIAGNOSIS — I10 Essential (primary) hypertension: Secondary | ICD-10-CM | POA: Insufficient documentation

## 2015-02-24 DIAGNOSIS — M109 Gout, unspecified: Secondary | ICD-10-CM | POA: Insufficient documentation

## 2015-02-24 DIAGNOSIS — F319 Bipolar disorder, unspecified: Secondary | ICD-10-CM | POA: Insufficient documentation

## 2015-02-24 LAB — MICROSCOPIC EXAMINATION
Bacteria, UA: NONE SEEN
Epithelial Cells (non renal): NONE SEEN /hpf (ref 0–10)

## 2015-02-24 LAB — URINALYSIS, COMPLETE
Bilirubin, UA: NEGATIVE
Glucose, UA: NEGATIVE
Ketones, UA: NEGATIVE
LEUKOCYTES UA: NEGATIVE
NITRITE UA: NEGATIVE
PH UA: 7 (ref 5.0–7.5)
Protein, UA: NEGATIVE
Specific Gravity, UA: 1.015 (ref 1.005–1.030)
Urobilinogen, Ur: 0.2 mg/dL (ref 0.2–1.0)

## 2015-02-24 NOTE — Progress Notes (Signed)
2:47 PM   Craig Wilson Apr 08, 1941 [id number]  Referring provider: Adin Hector, MD [place], [place] [address]  Chief Complaint  Patient presents with  . Urinary Incontinence    follow up on AUS      Urinary incontinence S/p AMS 800 sphincter placed at [facility] around 2006 for severe stress leakage. Has overall done fantastic, though now noticing some small volume leakage with urgency and right before he completely deactivates sphincter for voidign. No involuntary urine loss at rest or between voids. Pump with fluid in resevoid and no leakage on in-office valsalva test. PVR "43mL". Overall bother modest.   Prostate cancer Patient underwent RRP by Dr. Bernardo Wilson, radiation by Dr. Donella Wilson, and an AUS placed at [facility] in 2006.  I do not have the patient's pathology or surgical records. His most recent PSA was 0.01 ng/mL on 08/18/2014.  Gross hematuria Patient underwent a hematuria workup with CT urogram and cystoscopy with bilateral retrogrades 2016. No malignancy was discovered. Patient has had no further episodes of gross hematuria. His urinalysis today is unremarkable.   Today "Craig Wilson" is seen for f/u above and help verify no frank sphincter malfunction.   PMH: Past Medical History  Diagnosis Date  . Sleep apnea   . Dyspnea   . Anemia   . Chronic diarrhea   . Type 2 diabetes mellitus   . Prostate cancer   . Bipolar disorder   . Colon polyps   . Tremor   . HLD (hyperlipidemia)   . HTN (hypertension)   . Depression   . Dermatophytosis   . Gout   . DJD (degenerative joint disease)   . Hyperkeratosis   . Onychomycosis   . Diabetic neuropathy   . Gross hematuria   . Parkinson disease     Surgical History: Past Surgical History  Procedure Laterality Date  . Prostate surgery      removed  . Status post implantation of artificial urinary sphincter     . Colonoscopy w/ biopsies    . Foot surgery    . Tonsillectomy    . Cataract surgery Bilateral      Home Medications:    Medication List       This list is accurate as of: 02/24/15  2:47 PM.  Always use your most recent med list.               aspirin 81 MG tablet  Take 81 mg by mouth daily.     atenolol 25 MG tablet  Commonly known as:  TENORMIN  Take 25 mg by mouth daily.     atorvastatin 40 MG tablet  Commonly known as:  LIPITOR  Take 40 mg by mouth daily.     azelastine 0.1 % nasal spray  Commonly known as:  ASTELIN  Place 1 spray into both nostrils 2 (two) times daily. Use in each nostril as directed     colchicine 0.6 MG tablet  Take 1 tablet by mouth at first sign of gout flare followed by 1 tablet (0.6mg ) after 1 hour if no relief. (Max 1.8mg  in 1 day)     glucose blood test strip  1 each by Other route as needed for other. Use as instructed     lithium carbonate 300 MG CR tablet  Commonly known as:  LITHOBID  Take 300 mg by mouth 2 (two) times daily.     losartan 50 MG tablet  Commonly known as:  COZAAR  Take 50 mg by mouth daily.     metFORMIN 500 MG tablet  Commonly known as:  GLUCOPHAGE  Take 500 mg by mouth 2 (two) times daily with a meal.     MICROLET LANCETS Misc  by Does not apply route.     rOPINIRole 0.5 MG tablet  Commonly known as:  REQUIP  TK 1 T PO Q NIGHT FOR 1 WEEK THEN TK 2 TS PO Q NIGHT     selegiline 5 MG capsule  Commonly known as:  ELDEPRYL  Take by mouth.     traZODone 50 MG tablet  Commonly known as:  DESYREL  TAKE 1 TO 2 TABLETS BY MOUTH EVERY NIGHT AT BEDTIME AS NEEDED FOR SLEEP     Vitamin D (Cholecalciferol) 1000 UNITS Tabs  Take 1,000 Units by mouth daily.     vitamin E 100 UNIT capsule  Take 100 Units by mouth daily.        Allergies:  Allergies  Allergen Reactions  . Carbidopa W-Levodopa Other (See Comments)    irrational behavior , felt zombie like  . Latex   . Vesicare [Solifenacin]     Family History: Family History  Problem Relation Age of Onset  . Kidney cancer Neg Hx   . Prostate cancer  Neg Hx   . Bladder Cancer Neg Hx   . Heart disease Mother   . Heart attack Father 78  . Hypertension Father   . Hyperlipidemia Father     Social History:  reports that he quit smoking about 15 years ago. He does not have any smokeless tobacco history on file. He reports that he does not drink alcohol or use illicit drugs.  ROS: UROLOGY Frequent Urination?: Yes Hard to postpone urination?: No Burning/pain with urination?: No Get up at night to urinate?: Yes Leakage of urine?: Yes Urine stream starts and stops?: Yes Trouble starting stream?: Yes Do you have to strain to urinate?: No Blood in urine?: No Urinary tract infection?: No Sexually transmitted disease?: No Injury to kidneys or bladder?: No Painful intercourse?: No Weak stream?: No Erection problems?: No Penile pain?: No  Gastrointestinal Nausea?: No Vomiting?: No Indigestion/heartburn?: No Diarrhea?: Yes Constipation?: No  Constitutional Fever: No Night sweats?: No Weight loss?: No Fatigue?: No  Skin Skin rash/lesions?: No Itching?: No  Eyes Blurred vision?: No Double vision?: No  Ears/Nose/Throat Sore throat?: No Sinus problems?: No  Hematologic/Lymphatic Swollen glands?: No Easy bruising?: No  Cardiovascular Leg swelling?: No Chest pain?: No  Respiratory Cough?: No Shortness of breath?: Yes  Endocrine Excessive thirst?: No  Musculoskeletal Back pain?: No Joint pain?: No  Neurological Headaches?: No Dizziness?: No  Psychologic Depression?: No Anxiety?: No  Physical Exam: BP 152/62 mmHg  Pulse 56  Ht 5\' 7"  (1.702 m)  Wt 176 lb (79.833 kg)  BMI 27.56 kg/m2  GU: Patient with circumcised phallus.   Urethral meatus is patent.  No penile discharge. No penile lesions or rashes. Scrotum without lesions, cysts, rashes and/or edema.  Testicles are located scrotally bilaterally. No masses are appreciated in the testicles. Left and right epididymis are normal. Rt hemiscrotum AUS pump  with fluid in resevoir. Sphincter palpable at bulbar urethra w/o cutanoues erosion. No leakage with cough / valsalva.  Rectal: Patient with  normal sphincter tone. Perineum without scarring or rashes. No rectal masses are appreciated. Prostate is surgically absent.     Laboratory Data: Lab Results  Component Value Date   WBC 8.2 05/10/2014   HGB 12.0* 05/10/2014  HCT 36.3* 05/10/2014   MCV 100 05/10/2014   PLT 137* 05/10/2014    Lab Results  Component Value Date   CREATININE 0.86 05/10/2014    Lab Results  Component Value Date   PSA  09/20/2010    ========== TEST NAME ==========  ========= RESULTS =========  = REFERENCE RANGE =  PROSTATE-SPECIFIC AG.  PSA, Serum (Serial Monitor) Prostate Specific Ag, Serum     [   <0.1 ng/mL           ]           0.0-4.0 Roche ECLIA methodology.                                                                      . According to the American Urological Association, Serum PSA should decrease and remain at undetectable levels after radical prostatectomy. The AUA defines biochemical recurrence as an initial PSA value 0.2 ng/mL or greater followed by a subsequent confirmatory PSA value 0.2 ng/mL or greater. Values obtained with different assay methods or kits cannot be used interchangeably. Results cannot be interpreted as absolute evidence of the presence or absence of malignant disease.               [facility]            No: [id number]           [address] [place], [place] [address]           Lindon Romp, MD         [phone]   Result(s) reported on 21 Sep 2010 at 12:50PM.    PSA history:  0.01 ng/mL on 02/15/2014  0.01 ng/mL on 08/18/2014  Assessment & Plan:    1. Incontinence  - history most c/w OAB with spasms just overcoming ability of sphincter to hold back before volitional voiding. As he is dry between voids and has no leakage with provocatavie maneuvers in office I feel his sphincter is working well.  Discussed options of observation v. Trial of anticholinergic and he opts for observation. Should bother increase, will consider anticholinergic or myrbetriq in future. He was reassured.   2. Prostate cancer:   Fantastic biochemical control. Continue yearly PSA.   3. Gross hematuria:   Resolved and s/p complete eval this year. Observe  4 - RTC 1 year with PSA prior.    No Follow-up on file.  Alexis Frock, [facility]  [address], [place], [place] [address] [phone]

## 2015-03-29 DIAGNOSIS — G2581 Restless legs syndrome: Secondary | ICD-10-CM | POA: Insufficient documentation

## 2015-05-10 DIAGNOSIS — F3175 Bipolar disorder, in partial remission, most recent episode depressed: Secondary | ICD-10-CM | POA: Insufficient documentation

## 2015-05-10 DIAGNOSIS — R600 Localized edema: Secondary | ICD-10-CM | POA: Insufficient documentation

## 2015-09-07 ENCOUNTER — Other Ambulatory Visit: Payer: Self-pay | Admitting: Internal Medicine

## 2015-09-07 DIAGNOSIS — Z8546 Personal history of malignant neoplasm of prostate: Secondary | ICD-10-CM

## 2015-09-07 DIAGNOSIS — D696 Thrombocytopenia, unspecified: Secondary | ICD-10-CM | POA: Insufficient documentation

## 2015-09-07 DIAGNOSIS — M5431 Sciatica, right side: Secondary | ICD-10-CM

## 2015-09-28 ENCOUNTER — Ambulatory Visit
Admission: RE | Admit: 2015-09-28 | Discharge: 2015-09-28 | Disposition: A | Discharge status: Home / Self Care | Payer: Medicare Other | Source: Ambulatory Visit | Attending: Internal Medicine | Admitting: Internal Medicine

## 2015-09-28 DIAGNOSIS — Z8546 Personal history of malignant neoplasm of prostate: Secondary | ICD-10-CM

## 2015-09-28 DIAGNOSIS — M5441 Lumbago with sciatica, right side: Secondary | ICD-10-CM | POA: Diagnosis present

## 2015-09-28 DIAGNOSIS — M5431 Sciatica, right side: Secondary | ICD-10-CM

## 2015-09-28 DIAGNOSIS — M5126 Other intervertebral disc displacement, lumbar region: Secondary | ICD-10-CM | POA: Diagnosis not present

## 2015-09-28 LAB — POCT I-STAT CREATININE: Creatinine, Ser: 1 mg/dL (ref 0.61–1.24)

## 2015-09-28 MED ORDER — GADOBENATE DIMEGLUMINE 529 MG/ML IV SOLN
20.0000 mL | Freq: Once | INTRAVENOUS | Status: AC | PRN
  Administered 2015-09-28: 16 mL via INTRAVENOUS

## 2015-10-19 DIAGNOSIS — R221 Localized swelling, mass and lump, neck: Secondary | ICD-10-CM | POA: Insufficient documentation

## 2015-12-01 DIAGNOSIS — D239 Other benign neoplasm of skin, unspecified: Secondary | ICD-10-CM | POA: Insufficient documentation

## 2016-02-21 ENCOUNTER — Other Ambulatory Visit: Payer: Medicare Other

## 2016-02-21 DIAGNOSIS — C61 Malignant neoplasm of prostate: Secondary | ICD-10-CM

## 2016-02-22 LAB — PSA: Prostate Specific Ag, Serum: <0.1 ng/mL (ref 0.0–4.0)

## 2016-02-26 ENCOUNTER — Encounter: Payer: Self-pay | Admitting: Urology

## 2016-02-26 ENCOUNTER — Ambulatory Visit (INDEPENDENT_AMBULATORY_CARE_PROVIDER_SITE_OTHER): Payer: Medicare Other | Admitting: Urology

## 2016-02-26 VITALS — BP 167/61 | HR 54 | Ht 70.0 in | Wt 186.0 lb

## 2016-02-26 DIAGNOSIS — R32 Unspecified urinary incontinence: Secondary | ICD-10-CM | POA: Diagnosis not present

## 2016-02-26 DIAGNOSIS — Z8546 Personal history of malignant neoplasm of prostate: Secondary | ICD-10-CM

## 2016-02-26 DIAGNOSIS — Z87448 Personal history of other diseases of urinary system: Secondary | ICD-10-CM

## 2016-02-26 DIAGNOSIS — R351 Nocturia: Secondary | ICD-10-CM

## 2016-02-26 LAB — BLADDER SCAN AMB NON-IMAGING

## 2016-02-26 NOTE — Progress Notes (Signed)
02/26/2016 2:39 PM   Craig Wilson 29-Jun-1940 [id number]  Referring provider: Adin Hector, MD [place], [place] [address]  Chief Complaint  Patient presents with  . Urinary Incontinence    1year    HPI: Patient is a 75 year old Caucasian male with a history of prostate cancer, incontinence (s/p AUS ~10 years ago), history of gross hematuria who presents today for a yearly follow up.    Urinary incontinence Patient states the AUS is working properly.  He is experiencing post void dribbling and urgency.  He was given a trial of Myrbetriq, but he cannot recall if it was effective.  He is also experiencing frequent urination, hard to postpone urination and getting up at night to urinate.  AUS was placed at [facility] in 2006.  His PVR was 15 mL.  His IPSS score today is 14/2.        IPSS    Row Name 02/26/16 1400         International Prostate Symptom Score   How often have you had the sensation of not emptying your bladder? Almost always     How often have you had to urinate less than every two hours? Not at All     How often have you found you stopped and started again several times when you urinated? Almost always     How often have you found it difficult to postpone urination? Not at All     How often have you had a weak urinary stream? Not at All     How often have you had to strain to start urination? Not at All     How many times did you typically get up at night to urinate? 4 Times     Total IPSS Score 14       Quality of Life due to urinary symptoms   If you were to spend the rest of your life with your urinary condition just the way it is now how would you feel about that? Mostly Satisfied        Score:  1-7 Mild 8-19 Moderate 20-35 Severe  Prostate cancer Patient underwent RRP by Dr. Bernardo Heater, radiation by Dr. Donella Stade, and an [place] placed at [facility] in 2006.  I do not have the patient's pathology or surgical records. His  most recent PSA was <0.1 ng/mL on 02/21/2016.    History of gross hematuria Patient underwent a hematuria workup with CT urogram and cystoscopy with bilateral retrogrades in 2016.  No malignancy was discovered. Patient has had no further episodes of gross hematuria.   Nocturia Patient complains of getting up 4 times nightly.  He has been diagnosed with sleep apnea, but he does not sleep with a CPAP machine.  PMH: Past Medical History:  Diagnosis Date  . Anemia   . Bipolar disorder ([place])   . Chronic diarrhea   . Colon polyps   . Depression   . Dermatophytosis   . Diabetic neuropathy ([place])   . DJD (degenerative joint disease)   . Dyspnea   . Gout   . Gross hematuria   . HLD (hyperlipidemia)   . HTN (hypertension)   . Hyperkeratosis   . Onychomycosis   . Parkinson disease ([place])   . Prostate cancer ([name])   . Sleep apnea   . Tremor   . Type 2 diabetes mellitus [facility])     Surgical History: Past Surgical History:  Procedure Laterality Date  .  cataract surgery Bilateral   . COLONOSCOPY W/ BIOPSIES    . FOOT SURGERY    . PROSTATE SURGERY     removed  . status post implantation of artificial urinary sphincter     . TONSILLECTOMY      Home Medications:    Medication List       Accurate as of 02/26/16  2:39 PM. Always use your most recent med list.          aspirin 81 MG tablet Take 81 mg by mouth daily.   atenolol 25 MG tablet Commonly known as:  TENORMIN Take 25 mg by mouth daily.   atorvastatin 40 MG tablet Commonly known as:  LIPITOR Take 40 mg by mouth daily.   gabapentin 100 MG capsule Commonly known as:  NEURONTIN   glucose blood test strip 1 each by Other route as needed for other. Use as instructed   lithium carbonate 300 MG CR tablet Commonly known as:  LITHOBID Take 300 mg by mouth 2 (two) times daily.   losartan 50 MG tablet Commonly known as:  COZAAR Take 50 mg by mouth daily.   metFORMIN 500 MG tablet Commonly known as:   GLUCOPHAGE Take 500 mg by mouth 2 (two) times daily with a meal.   MICROLET LANCETS Misc by Does not apply route.   MITIGARE 0.6 MG Caps Generic drug:  Colchicine   omeprazole 40 MG capsule Commonly known as:  PRILOSEC   rOPINIRole 0.5 MG tablet Commonly known as:  REQUIP TK 1 T PO Q NIGHT FOR 1 WEEK THEN TK 2 TS PO Q NIGHT   traZODone 50 MG tablet Commonly known as:  DESYREL TAKE 1 TO 2 TABLETS BY MOUTH EVERY NIGHT AT BEDTIME AS NEEDED FOR SLEEP   Vitamin D (Cholecalciferol) 1000 units Tabs Take 1,000 Units by mouth daily.   vitamin E 100 UNIT capsule Take 100 Units by mouth daily.       Allergies:  Allergies  Allergen Reactions  . Carbidopa W-Levodopa Other (See Comments)    irrational behavior , felt zombie like  . Latex   . Vesicare [Solifenacin]     Family History: Family History  Problem Relation Age of Onset  . Heart disease Mother   . Heart attack Father 50  . Hypertension Father   . Hyperlipidemia Father   . Kidney cancer Neg Hx   . Prostate cancer Neg Hx   . Bladder Cancer Neg Hx     Social History:  reports that he quit smoking about 16 years ago. He has never used smokeless tobacco. He reports that he does not drink alcohol or use drugs.  ROS: UROLOGY Frequent Urination?: Yes Hard to postpone urination?: No Burning/pain with urination?: Yes Get up at night to urinate?: Yes Leakage of urine?: Yes Urine stream starts and stops?: Yes Trouble starting stream?: No Do you have to strain to urinate?: No Blood in urine?: No Urinary tract infection?: No Sexually transmitted disease?: No Injury to kidneys or bladder?: No Painful intercourse?: No Weak stream?: No Erection problems?: No Penile pain?: No  Gastrointestinal Nausea?: No Vomiting?: No Indigestion/heartburn?: No Diarrhea?: Yes Constipation?: Yes  Constitutional Fever: No Night sweats?: No Weight loss?: No Fatigue?: No  Skin Skin rash/lesions?: No Itching?:  No  Eyes Blurred vision?: No Double vision?: No  Ears/Nose/Throat Sore throat?: No Sinus problems?: No  Hematologic/Lymphatic Swollen glands?: No Easy bruising?: No  Cardiovascular Leg swelling?: No Chest pain?: No  Respiratory Cough?: No Shortness of breath?: No  Endocrine Excessive thirst?: No  Musculoskeletal Back pain?: No Joint pain?: No  Neurological Headaches?: No Dizziness?: No  Psychologic Depression?: Yes Anxiety?: No  Physical Exam: BP (!) 167/61   Pulse (!) 54   Ht 5\' 10"  (1.778 m)   Wt 186 lb (84.4 kg)   BMI 26.69 kg/m   Constitutional: Well nourished. Alert and oriented, No acute distress. HEENT: [place] AT, moist mucus membranes. Trachea midline, no masses. Cardiovascular: No clubbing, cyanosis, or edema. Respiratory: Normal respiratory effort, no increased work of breathing. GI: Abdomen is soft, non tender, non distended, no abdominal masses. Liver and spleen not palpable.  No hernias appreciated.  Stool sample for occult testing is not indicated.   GU: No CVA tenderness.  No bladder fullness or masses.  Patient with circumcised phallus.  Urethral meatus is patent.  No penile discharge. No penile lesions or rashes. Scrotum without lesions, cysts, rashes and/or edema.  Testicles are located scrotally bilaterally. No masses are appreciated in the testicles. Left and right epididymis are normal.  AUS pump located in the right scrotum.   Rectal: Patient with  normal sphincter tone. Anus and perineum without scarring or rashes. No rectal masses are appreciated. Prostate is surgically absent. Seminal vesicles are surgically absent. Skin: No rashes, bruises or suspicious lesions. Lymph: No cervical or inguinal adenopathy. Neurologic: Grossly intact, no focal deficits, moving all 4 extremities. Psychiatric: Normal mood and affect.    Laboratory Data: Lab Results  Component Value Date   WBC 8.2 05/10/2014   HGB 12.0 (L) 05/10/2014   HCT 36.3 (L)  05/10/2014   MCV 100 05/10/2014   PLT 137 (L) 05/10/2014    Lab Results  Component Value Date   CREATININE 1.00 09/28/2015     PSA history:  0.01 ng/mL on 02/15/2014  0.01 ng/mL on 08/18/2014  <0.1  ng/mL on 02/21/2016  Pertinent imaging Results for Craig Wilson, ARNWINE (MRN [id number]) as of 03/01/2016 21:11  Ref. Range 02/26/2016 14:19  Scan Result Unknown 7ml   Assessment & Plan:    1. Incontinence  - IPSS score is 14/2  - AUS is functioning properly  - retrial of Myrbetriq 25 mg given at this time  - RTC in 3 weeks for IPSS and PVR  - BLADDER SCAN AMB NON-IMAGING  2. History of prostate cancer:   Patient underwent a RRP with adjunctive radiation in 2005. His most recent PSA was  <0.1 ng/mL on 02/21/2016.    3. History of hematuria:   Patient has completed a hematuria workup with CT urogram and cystoscopy with bilateral retrograde in 2016.   No GU malignancies were found. He has not had any further gross hematuria.     4. Nocturia  - I explained to the patient that nocturia is often multi-factorial and difficult to treat.  Sleeping disorders, heart conditions and peripheral vascular disease, diabetes,  enlarged prostate or urethral stricture causing bladder outlet obstruction and/or certain medications.  - I have suggested that the patient avoid caffeine and alcohol in the evening. He may also benefit from fluid restrictions after 6:00 in the evening and voiding just prior to bedtime.  - Research studies have showed that over 84% of patients with sleep apnea reported frequent nighttime urination.   With sleep apnea, oxygen decreases, carbon dioxide increases, the blood become more acidic, the heart rate drops and blood vessels in the lung constrict.  The body is then alerted that something is very wrong. The sleeper must wake enough to reopen the airway.  By this time, the heart is racing and experiences a false signal of fluid overload. The heart excretes a hormone-like protein  that tells the body to get rid of sodium and water, resulting in nocturia.  - The patient has sleep apnea but does not sleep with CPAP, he may benefit from another study and a newer CPAP machine that he can tolerate   Return in about 3 weeks (around 03/18/2016) for PVR and IPSS score.  Zara Council, [facility] [address], [place], [place] [address] [phone]

## 2016-03-11 DIAGNOSIS — M5136 Other intervertebral disc degeneration, lumbar region: Secondary | ICD-10-CM | POA: Insufficient documentation

## 2016-03-11 DIAGNOSIS — M51369 Other intervertebral disc degeneration, lumbar region without mention of lumbar back pain or lower extremity pain: Secondary | ICD-10-CM | POA: Insufficient documentation

## 2016-03-11 DIAGNOSIS — D692 Other nonthrombocytopenic purpura: Secondary | ICD-10-CM | POA: Insufficient documentation

## 2016-03-15 ENCOUNTER — Ambulatory Visit: Payer: Medicare Other | Admitting: Cardiovascular Disease

## 2016-03-18 ENCOUNTER — Ambulatory Visit (INDEPENDENT_AMBULATORY_CARE_PROVIDER_SITE_OTHER): Payer: Medicare Other | Admitting: Urology

## 2016-03-18 ENCOUNTER — Encounter: Payer: Self-pay | Admitting: Urology

## 2016-03-18 VITALS — BP 148/67 | HR 61 | Ht 69.0 in | Wt 185.5 lb

## 2016-03-18 DIAGNOSIS — N39498 Other specified urinary incontinence: Secondary | ICD-10-CM | POA: Diagnosis not present

## 2016-03-18 DIAGNOSIS — Z8546 Personal history of malignant neoplasm of prostate: Secondary | ICD-10-CM

## 2016-03-18 DIAGNOSIS — R351 Nocturia: Secondary | ICD-10-CM

## 2016-03-18 DIAGNOSIS — Z87448 Personal history of other diseases of urinary system: Secondary | ICD-10-CM

## 2016-03-18 LAB — BLADDER SCAN AMB NON-IMAGING: SCAN RESULT: 33

## 2016-03-18 MED ORDER — MIRABEGRON ER 25 MG PO TB24: 25.0000 mg | ORAL_TABLET | Freq: Every day | ORAL | 4 refills | Status: DC

## 2016-03-18 NOTE — Progress Notes (Signed)
03/18/2016 1:42 PM   Shepherd 01/04/1941 [id number]  Referring provider: Adin Hector, MD [facility] [place], [place] [address]  Chief Complaint  Patient presents with  . Follow-up    incontinence    HPI: Patient is a 75 year old Caucasian male with a history of prostate cancer, incontinence (s/p AUS ~10 years ago), history of gross hematuria who presents today for a yearly follow up.    Urinary incontinence Patient states the AUS is working properly.  He is experiencing post void dribbling and urgency.  He has found the trial of Myrbetriq effective.  This is not indicated by his IPSS score, but he would like to continue the Myrbetriq.  He is also experiencing frequent urination, hard to postpone urination and getting up at night to urinate.  AUS was placed at [facility] in 2006.   His IPSS score today is 21/4.  His PVR was 33 mL.  His previous IPSS score was 14/2.   His previous PVR was 15 mL.        IPSS    Row Name 02/26/16 1400 03/18/16 1300       International Prostate Symptom Score   How often have you had the sensation of not emptying your bladder? Almost always More than half the time    How often have you had to urinate less than every two hours? Not at All Almost always    How often have you found you stopped and started again several times when you urinated? Almost always About half the time    How often have you found it difficult to postpone urination? Not at All Not at All    How often have you had a weak urinary stream? Not at All Not at All    How often have you had to strain to start urination? Not at All More than half the time    How many times did you typically get up at night to urinate? 4 Times 5 Times    Total IPSS Score 14 21      Quality of Life due to urinary symptoms   If you were to spend the rest of your life with your urinary condition just the way it is now how would you feel about that? Mostly Satisfied Mostly  Disatisfied       Score:  1-7 Mild 8-19 Moderate 20-35 Severe  Prostate cancer Patient underwent RRP by Dr. Bernardo Heater, radiation by Dr. Donella Stade, and an [place] placed at [facility] in 2006.  I do not have the patient's pathology or surgical records. His most recent PSA was <0.1 ng/mL on 02/21/2016.    History of gross hematuria Patient underwent a hematuria workup with CT urogram and cystoscopy with bilateral retrogrades in 2016.  No malignancy was discovered. Patient has had no further episodes of gross hematuria.   Nocturia Patient complains of getting up 4 times nightly.  He has been diagnosed with sleep apnea, but he does not sleep with a CPAP machine.  PMH: Past Medical History:  Diagnosis Date  . Anemia   . Bipolar disorder ([place])   . Chronic diarrhea   . Colon polyps   . Depression   . Dermatophytosis   . Diabetic neuropathy ([place])   . DJD (degenerative joint disease)   . Dyspnea   . Gout   . Gross hematuria   . HLD (hyperlipidemia)   . HTN (hypertension)   . Hyperkeratosis   . Onychomycosis   .  Parkinson disease ([place])   . Prostate cancer ([place])   . Sleep apnea   . Tremor   . Type 2 diabetes mellitus [facility])     Surgical History: Past Surgical History:  Procedure Laterality Date  . cataract surgery Bilateral   . COLONOSCOPY W/ BIOPSIES    . FOOT SURGERY    . PROSTATE SURGERY     removed  . status post implantation of artificial urinary sphincter     . TONSILLECTOMY      Home Medications:    Medication List       Accurate as of 03/18/16  1:42 PM. Always use your most recent med list.          aspirin 81 MG tablet Take 81 mg by mouth daily.   atenolol 25 MG tablet Commonly known as:  TENORMIN Take 25 mg by mouth daily.   atorvastatin 40 MG tablet Commonly known as:  LIPITOR Take 40 mg by mouth daily.   gabapentin 100 MG capsule Commonly known as:  NEURONTIN   glucose blood test strip 1 each by Other route as needed for other. Use as instructed     lithium carbonate 300 MG CR tablet Commonly known as:  LITHOBID Take 300 mg by mouth 2 (two) times daily.   losartan 50 MG tablet Commonly known as:  COZAAR Take 50 mg by mouth daily.   metFORMIN 500 MG tablet Commonly known as:  GLUCOPHAGE Take 500 mg by mouth 2 (two) times daily with a meal.   MICROLET LANCETS Misc by Does not apply route.   mirabegron ER 25 MG Tb24 tablet Commonly known as:  MYRBETRIQ Take 1 tablet (25 mg total) by mouth daily.   MITIGARE 0.6 MG Caps Generic drug:  Colchicine   Colchicine 0.6 MG Caps Take by mouth.   omeprazole 40 MG capsule Commonly known as:  PRILOSEC   rOPINIRole 0.5 MG tablet Commonly known as:  REQUIP TK 1 T PO Q NIGHT FOR 1 WEEK THEN TK 2 TS PO Q NIGHT   traZODone 50 MG tablet Commonly known as:  DESYREL TAKE 1 TO 2 TABLETS BY MOUTH EVERY NIGHT AT BEDTIME AS NEEDED FOR SLEEP       Allergies:  Allergies  Allergen Reactions  . Carbidopa W-Levodopa Other (See Comments)    irrational behavior , felt zombie like  . Latex   . Vesicare [Solifenacin]     Family History: Family History  Problem Relation Age of Onset  . Heart disease Mother   . Heart attack Father 18  . Hypertension Father   . Hyperlipidemia Father   . Kidney cancer Neg Hx   . Prostate cancer Neg Hx   . Bladder Cancer Neg Hx     Social History:  reports that he quit smoking about 16 years ago. He has never used smokeless tobacco. He reports that he does not drink alcohol or use drugs.  ROS: UROLOGY Frequent Urination?: Yes Hard to postpone urination?: Yes Burning/pain with urination?: No Get up at night to urinate?: Yes Leakage of urine?: Yes Urine stream starts and stops?: No Trouble starting stream?: No Do you have to strain to urinate?: No Blood in urine?: No Urinary tract infection?: No Sexually transmitted disease?: No Injury to kidneys or bladder?: No Painful intercourse?: No Weak stream?: No Erection problems?: No Penile pain?:  No  Gastrointestinal Nausea?: No Vomiting?: No Indigestion/heartburn?: No Diarrhea?: No Constipation?: No  Constitutional Fever: No Night sweats?: No Weight loss?: No Fatigue?: No  Skin Skin rash/lesions?: No Itching?: No  Eyes Blurred vision?: No Double vision?: No  Ears/Nose/Throat Sore throat?: No Sinus problems?: No  Hematologic/Lymphatic Swollen glands?: No Easy bruising?: No  Cardiovascular Leg swelling?: Yes (foot swelling) Chest pain?: No  Respiratory Cough?: No Shortness of breath?: No  Endocrine Excessive thirst?: No  Musculoskeletal Back pain?: Yes Joint pain?: No  Neurological Headaches?: No Dizziness?: No  Psychologic Depression?: Yes Anxiety?: No  Physical Exam: BP (!) 148/67   Pulse 61   Ht 5\' 9"  (1.753 m)   Wt 185 lb 8 oz (84.1 kg)   BMI 27.39 kg/m   Constitutional: Well nourished. Alert and oriented, No acute distress. HEENT: [place] AT, moist mucus membranes. Trachea midline, no masses. Cardiovascular: No clubbing, cyanosis, or edema. Respiratory: Normal respiratory effort, no increased work of breathing. GI: Abdomen is soft, non tender, non distended, no abdominal masses. Liver and spleen not palpable.  No hernias appreciated.  Stool sample for occult testing is not indicated.   GU: No CVA tenderness.  No bladder fullness or masses.  Patient with circumcised phallus.  Urethral meatus is patent.  No penile discharge. No penile lesions or rashes. Scrotum without lesions, cysts, rashes and/or edema.  Testicles are located scrotally bilaterally. No masses are appreciated in the testicles. Left and right epididymis are normal.  AUS pump located in the right scrotum.   Rectal: Patient with  normal sphincter tone. Anus and perineum without scarring or rashes. No rectal masses are appreciated. Prostate is surgically absent. Seminal vesicles are surgically absent. Skin: No rashes, bruises or suspicious lesions. Lymph: No cervical or inguinal  adenopathy. Neurologic: Grossly intact, no focal deficits, moving all 4 extremities. Psychiatric: Normal mood and affect.    Laboratory Data: Lab Results  Component Value Date   WBC 8.2 05/10/2014   HGB 12.0 (L) 05/10/2014   HCT 36.3 (L) 05/10/2014   MCV 100 05/10/2014   PLT 137 (L) 05/10/2014    Lab Results  Component Value Date   CREATININE 1.00 09/28/2015     PSA history:  0.01 ng/mL on 02/15/2014  0.01 ng/mL on 08/18/2014  <0.1  ng/mL on 02/21/2016  Pertinent imaging Results for RAPHAEL, PENNISON (MRN [id number]) as of 03/18/2016 13:50  Ref. Range 03/18/2016 13:32  Scan Result Unknown 33    Assessment & Plan:    1. Incontinence  - IPSS score is 21/4, it is worsening- but patient states it has improved and want to continue the medication  - AUS is functioning properly  - prescription of Myrbetriq 25 mg is given at this time, as well as samples  - RTC in one year for IPSS and PVR  - BLADDER SCAN AMB NON-IMAGING  2. History of prostate cancer  - Patient underwent a RRP with adjunctive radiation in 2005. His most recent PSA was  <0.1 ng/mL on 02/21/2016.    - RTC in one year for PSA  3. History of hematuria  - Patient has completed a hematuria workup with CT urogram and cystoscopy with bilateral retrograde in 2016.   No GU malignancies were found.   He has not had any further gross hematuria.     4. Nocturia  - Encouraged the patient to undergo a sleep study  Return in about 1 year (around 03/18/2017) for IPSS, PSA and exam.  Zara Council, [facility]  [place] [address] [place], [place] [address] [phone]

## 2016-06-13 ENCOUNTER — Ambulatory Visit (INDEPENDENT_AMBULATORY_CARE_PROVIDER_SITE_OTHER): Payer: Medicare Other | Admitting: Cardiovascular Disease

## 2016-06-13 ENCOUNTER — Encounter: Payer: Self-pay | Admitting: Cardiovascular Disease

## 2016-06-13 VITALS — BP 130/52 | HR 53 | Ht 69.0 in | Wt 181.8 lb

## 2016-06-13 DIAGNOSIS — I10 Essential (primary) hypertension: Secondary | ICD-10-CM | POA: Diagnosis not present

## 2016-06-13 DIAGNOSIS — G20 Parkinson's disease: Secondary | ICD-10-CM

## 2016-06-13 DIAGNOSIS — E782 Mixed hyperlipidemia: Secondary | ICD-10-CM | POA: Diagnosis not present

## 2016-06-13 DIAGNOSIS — R0602 Shortness of breath: Secondary | ICD-10-CM | POA: Diagnosis not present

## 2016-06-13 DIAGNOSIS — E118 Type 2 diabetes mellitus with unspecified complications: Secondary | ICD-10-CM

## 2016-06-13 DIAGNOSIS — D649 Anemia, unspecified: Secondary | ICD-10-CM

## 2016-06-13 DIAGNOSIS — R2681 Unsteadiness on feet: Secondary | ICD-10-CM

## 2016-06-13 NOTE — Progress Notes (Signed)
Cardiology Office Note  Date:  06/13/2016   ID:  Craig Wilson, DOB [birth year], MRN [id number]  PCP:  Adin Hector, MD   Chief Complaint  Patient presents with  . other    early 2 month fu. Pt c/i weight gain, sob, "stumbling"/"running into things", pulse in left ear. Reviewed meds with pt verbally.    HPI:  Craig Wilson is a very pleasant 76 year old gentleman with history of Parkinson's, hypertension, diabetes, prostate cancer with prior radiation treatment, hyperlipidemia who presents for evaluation of shortness of breath, coronary artery disease, hypertension.  In follow-up he reports that he is doing well Chronic stable mild shortness of breath with hills and stairs No regular exercise program, Gait instability  He is concerned about low heart rate, low diastolic pressure Previous CT coronary calcium score reviewed with him, score of 7  Previously was symptoms of dizziness, ankle swelling. Currently not active issues Prior history of chronic diarrhea  Lab work reviewed Total chol 157, LDL 72 On lipitor 40 HBa1C 5.8 HCT 32. Does not take iron  Orthostatics done in the office, no significant change in pressure. Heart rate stayed at 54 bpm. Systolic pressure [id number] up to 140  EKG on today's visit shows normal sinus rhythm with sinus bradycardia, rate 53 bpm, right bundle branch block  Other past medical history reviewed  strong family history. Father had MI in his 71s, mother had stroke in her 11s Brother has cancer   PMH:   has a past medical history of Anemia; Bipolar disorder ([place]); Chronic diarrhea; Colon polyps; Depression; Dermatophytosis; Diabetic neuropathy ([place]); DJD (degenerative joint disease); Dyspnea; Gout; Gross hematuria; HLD (hyperlipidemia); HTN (hypertension); Hyperkeratosis; Onychomycosis; Parkinson disease ([place]); Prostate cancer ([place]); Sleep apnea; Tremor; and Type 2 diabetes mellitus ([place]).  PSH:    Past Surgical History:  Procedure  Laterality Date  . cataract surgery Bilateral   . COLONOSCOPY W/ BIOPSIES    . FOOT SURGERY    . PROSTATE SURGERY     removed  . status post implantation of artificial urinary sphincter     . TONSILLECTOMY      Current Outpatient Prescriptions  Medication Sig Dispense Refill  . aspirin 81 MG tablet Take 81 mg by mouth daily.    Marland Kitchen atorvastatin (LIPITOR) 40 MG tablet Take 40 mg by mouth daily.    . Colchicine 0.6 MG CAPS Take by mouth.    . gabapentin (NEURONTIN) 100 MG capsule     . glucose blood test strip 1 each by Other route as needed for other. Use as instructed    . lithium carbonate (LITHOBID) 300 MG CR tablet Take 300 mg by mouth 2 (two) times daily.    Marland Kitchen losartan (COZAAR) 50 MG tablet Take 50 mg by mouth daily.    . metFORMIN (GLUCOPHAGE) 500 MG tablet Take 500 mg by mouth 2 (two) times daily with a meal.    . MICROLET LANCETS MISC by Does not apply route.    . mirabegron ER (MYRBETRIQ) 25 MG TB24 tablet Take 1 tablet (25 mg total) by mouth daily. 90 tablet 4  . MITIGARE 0.6 MG CAPS     . omeprazole (PRILOSEC) 40 MG capsule     . rOPINIRole (REQUIP) 0.5 MG tablet TK 1 T PO Q NIGHT FOR 1 WEEK THEN TK 2 TS PO Q NIGHT  5  . traZODone (DESYREL) 50 MG tablet TAKE 1 TO 2 TABLETS BY MOUTH EVERY NIGHT AT BEDTIME AS NEEDED FOR SLEEP  No current facility-administered medications for this visit.      Allergies:   Carbidopa w-levodopa; Latex; and Vesicare [solifenacin]   Social History:  The patient  reports that he quit smoking about 16 years ago. He has never used smokeless tobacco. He reports that he does not drink alcohol or use drugs.   Family History:   family history includes Heart attack (age of onset: 16) in his father; Heart disease in his mother; Hyperlipidemia in his father; Hypertension in his father.    Review of Systems: Review of Systems  Constitutional: Negative.   Respiratory: Negative.   Cardiovascular: Negative.   Gastrointestinal: Negative.    Musculoskeletal: Negative.   Neurological: Negative.   Psychiatric/Behavioral: Negative.   All other systems reviewed and are negative.    PHYSICAL EXAM: VS:  BP (!) 130/52 (BP Location: Left Arm, Patient Position: Sitting, Cuff Size: Normal)   Pulse (!) 53   Ht 5\' 9"  (1.753 m)   Wt 181 lb 12 oz (82.4 kg)   BMI 26.84 kg/m  , BMI Body mass index is 26.84 kg/m. GEN: Well nourished, well developed, in no acute distress, walks with a cane  HEENT: normal  Neck: no JVD, carotid bruits, or masses Cardiac: RRR; no murmurs, rubs, or gallops,no edema  Respiratory:  clear to auscultation bilaterally, normal work of breathing GI: soft, nontender, nondistended, + BS MS: no deformity or atrophy  Skin: warm and dry, no rash Neuro:  Strength and sensation are intact Psych: euthymic mood, full affect    Recent Labs: 09/28/2015: Creatinine, Ser 1.00    Lipid Panel No results found for: CHOL, HDL, LDLCALC, TRIG    Wt Readings from Last 3 Encounters:  06/13/16 181 lb 12 oz (82.4 kg)  03/18/16 185 lb 8 oz (84.1 kg)  02/26/16 186 lb (84.4 kg)       ASSESSMENT AND PLAN:  Essential hypertension - Plan: EKG 12-Lead He is concerned about low heart rate, low diastolic pressure Recommended he hold atenolol at this time Continue to monitor blood pressure and heart rate at home  Mixed hyperlipidemia Cholesterol is at goal on the current lipid regimen. No changes to the medications were made. Minimal underlying coronary calcifications seen on CT scan  SOB (shortness of breath) Reports stable chronic mild shortness of breath. Recommended a regular exercise program  Parkinson's disease ([place]) Walks with a cane, Unsteady gait  Type 2 diabetes mellitus with complication, without long-term current use of insulin (HCC) Hemoglobin A1c well controlled. Encouraged him to monitor his carbohydrate intake  Anemia, unspecified type Worsening anemia over the past several years. Recommended he  discuss this with primary care. May want to check iron levels    Total encounter time more than 25 minutes  Greater than 50% was spent in counseling and coordination of care with the patient   Disposition:   F/U  6 months   Orders Placed This Encounter  Procedures  . EKG 12-Lead     Signed, Esmond Plants, M.D., Ph.D. 06/13/2016  [place], [place]

## 2016-06-13 NOTE — Patient Instructions (Addendum)
Medication Instructions:   Please stop the atenolol, Heart rate is low  Consider an iron pill 1 to 2 a day Talk with Dr. Caryl Comes about anemia  Labwork:  No new labs needed  Testing/Procedures:  No further testing at this time   I recommend watching educational videos on topics of interest to you at:       www.goemmi.com  Enter code: [email or web address]    Follow-Up: It was a pleasure seeing you in the office today. Please call us if you have new issues that need to be addressed before your next appt.  [phone]  Your physician wants you to follow-up in: 12 months.  You will receive a reminder letter in the mail two months in advance. If you don't receive a letter, please call our office to schedule the follow-up appointment.  If you need a refill on your cardiac medications before your next appointment, please call your pharmacy.

## 2016-07-01 ENCOUNTER — Inpatient Hospital Stay
Admission: EM | Admit: 2016-07-01 | Discharge: 2016-07-03 | DRG: 871 | Disposition: A | Discharge status: Home / Self Care | Payer: Medicare Other | Attending: Internal Medicine | Admitting: Internal Medicine

## 2016-07-01 ENCOUNTER — Emergency Department: Payer: Medicare Other

## 2016-07-01 ENCOUNTER — Encounter: Payer: Self-pay | Admitting: Emergency Medicine

## 2016-07-01 DIAGNOSIS — J101 Influenza due to other identified influenza virus with other respiratory manifestations: Secondary | ICD-10-CM | POA: Diagnosis present

## 2016-07-01 DIAGNOSIS — Z86010 Personal history of colonic polyps: Secondary | ICD-10-CM

## 2016-07-01 DIAGNOSIS — F3175 Bipolar disorder, in partial remission, most recent episode depressed: Secondary | ICD-10-CM | POA: Diagnosis present

## 2016-07-01 DIAGNOSIS — H109 Unspecified conjunctivitis: Secondary | ICD-10-CM | POA: Diagnosis present

## 2016-07-01 DIAGNOSIS — Z8249 Family history of ischemic heart disease and other diseases of the circulatory system: Secondary | ICD-10-CM | POA: Diagnosis not present

## 2016-07-01 DIAGNOSIS — M109 Gout, unspecified: Secondary | ICD-10-CM | POA: Diagnosis present

## 2016-07-01 DIAGNOSIS — Z79899 Other long term (current) drug therapy: Secondary | ICD-10-CM | POA: Diagnosis not present

## 2016-07-01 DIAGNOSIS — J181 Lobar pneumonia, unspecified organism: Secondary | ICD-10-CM

## 2016-07-01 DIAGNOSIS — J1000 Influenza due to other identified influenza virus with unspecified type of pneumonia: Secondary | ICD-10-CM | POA: Diagnosis present

## 2016-07-01 DIAGNOSIS — G2581 Restless legs syndrome: Secondary | ICD-10-CM | POA: Diagnosis present

## 2016-07-01 DIAGNOSIS — I10 Essential (primary) hypertension: Secondary | ICD-10-CM | POA: Diagnosis present

## 2016-07-01 DIAGNOSIS — E785 Hyperlipidemia, unspecified: Secondary | ICD-10-CM | POA: Diagnosis present

## 2016-07-01 DIAGNOSIS — Z888 Allergy status to other drugs, medicaments and biological substances status: Secondary | ICD-10-CM

## 2016-07-01 DIAGNOSIS — Z7984 Long term (current) use of oral hypoglycemic drugs: Secondary | ICD-10-CM | POA: Diagnosis not present

## 2016-07-01 DIAGNOSIS — A419 Sepsis, unspecified organism: Secondary | ICD-10-CM | POA: Diagnosis present

## 2016-07-01 DIAGNOSIS — Z8546 Personal history of malignant neoplasm of prostate: Secondary | ICD-10-CM | POA: Diagnosis not present

## 2016-07-01 DIAGNOSIS — Z7982 Long term (current) use of aspirin: Secondary | ICD-10-CM | POA: Diagnosis not present

## 2016-07-01 DIAGNOSIS — I4510 Unspecified right bundle-branch block: Secondary | ICD-10-CM | POA: Diagnosis present

## 2016-07-01 DIAGNOSIS — E1140 Type 2 diabetes mellitus with diabetic neuropathy, unspecified: Secondary | ICD-10-CM | POA: Diagnosis present

## 2016-07-01 DIAGNOSIS — Z91040 Latex allergy status: Secondary | ICD-10-CM | POA: Diagnosis not present

## 2016-07-01 DIAGNOSIS — G4730 Sleep apnea, unspecified: Secondary | ICD-10-CM | POA: Diagnosis present

## 2016-07-01 DIAGNOSIS — J189 Pneumonia, unspecified organism: Secondary | ICD-10-CM | POA: Diagnosis present

## 2016-07-01 DIAGNOSIS — G20 Parkinson's disease: Secondary | ICD-10-CM | POA: Diagnosis present

## 2016-07-01 DIAGNOSIS — R918 Other nonspecific abnormal finding of lung field: Secondary | ICD-10-CM

## 2016-07-01 DIAGNOSIS — E119 Type 2 diabetes mellitus without complications: Secondary | ICD-10-CM

## 2016-07-01 DIAGNOSIS — Z87891 Personal history of nicotine dependence: Secondary | ICD-10-CM

## 2016-07-01 LAB — URINALYSIS, COMPLETE (UACMP) WITH MICROSCOPIC
BILIRUBIN URINE: NEGATIVE
Bacteria, UA: NONE SEEN
Glucose, UA: NEGATIVE mg/dL
Ketones, ur: NEGATIVE mg/dL
Leukocytes, UA: NEGATIVE
NITRITE: NEGATIVE
PH: 6 (ref 5.0–8.0)
Protein, ur: NEGATIVE mg/dL
SPECIFIC GRAVITY, URINE: 1.008 (ref 1.005–1.030)

## 2016-07-01 LAB — BASIC METABOLIC PANEL
ANION GAP: 6 (ref 5–15)
BUN: 12 mg/dL (ref 6–20)
CALCIUM: 9.4 mg/dL (ref 8.9–10.3)
CO2: 27 mmol/L (ref 22–32)
CREATININE: 0.99 mg/dL (ref 0.61–1.24)
Chloride: 103 mmol/L (ref 101–111)
GFR calc Af Amer: >60 mL/min (ref >60)
GLUCOSE: 158 mg/dL — AB (ref 65–99)
Potassium: 4.5 mmol/L (ref 3.5–5.1)
Sodium: 136 mmol/L (ref 135–145)

## 2016-07-01 LAB — CBC
HCT: 31.3 % — ABNORMAL LOW (ref 40.0–52.0)
Hemoglobin: 10.7 g/dL — ABNORMAL LOW (ref 13.0–18.0)
MCH: 33 pg (ref 26.0–34.0)
MCHC: 34.1 g/dL (ref 32.0–36.0)
MCV: 96.7 fL (ref 80.0–100.0)
Platelets: 222 10*3/uL (ref 150–440)
RBC: 3.24 MIL/uL — ABNORMAL LOW (ref 4.40–5.90)
RDW: 12.9 % (ref 11.5–14.5)
WBC: 16.7 10*3/uL — ABNORMAL HIGH (ref 3.8–10.6)

## 2016-07-01 LAB — INFLUENZA PANEL BY PCR (TYPE A & B)
Influenza A By PCR: POSITIVE — AB
Influenza B By PCR: NEGATIVE

## 2016-07-01 LAB — GLUCOSE, CAPILLARY: GLUCOSE-CAPILLARY: 133 mg/dL — AB (ref 65–99)

## 2016-07-01 MED ORDER — SODIUM CHLORIDE 0.9 % IV BOLUS (SEPSIS)
500.0000 mL | Freq: Once | INTRAVENOUS | Status: AC
  Administered 2016-07-01: 500 mL via INTRAVENOUS

## 2016-07-01 MED ORDER — OSELTAMIVIR PHOSPHATE 75 MG PO CAPS
75.0000 mg | ORAL_CAPSULE | Freq: Once | ORAL | Status: AC
  Administered 2016-07-01: 75 mg via ORAL
  Filled 2016-07-01: qty 1

## 2016-07-01 MED ORDER — ACETAMINOPHEN 500 MG PO TABS
1000.0000 mg | ORAL_TABLET | ORAL | Status: AC
  Administered 2016-07-01: 1000 mg via ORAL
  Filled 2016-07-01: qty 2

## 2016-07-01 MED ORDER — ERYTHROMYCIN 5 MG/GM OP OINT
TOPICAL_OINTMENT | Freq: Once | OPHTHALMIC | Status: AC
  Administered 2016-07-01: 1 via OPHTHALMIC
  Filled 2016-07-01: qty 1

## 2016-07-01 MED ORDER — LEVOFLOXACIN IN D5W 750 MG/150ML IV SOLN
750.0000 mg | Freq: Once | INTRAVENOUS | Status: AC
  Administered 2016-07-01: 750 mg via INTRAVENOUS
  Filled 2016-07-01: qty 150

## 2016-07-01 NOTE — ED Provider Notes (Signed)
[facility] Emergency Department Provider Note   ____________________________________________   First MD Initiated Contact with Patient 07/01/16 1918     (approximate)  I have reviewed the triage vital signs and the nursing notes.   HISTORY  Chief Complaint Altered Mental Status    HPI ELVIE TIKKANEN is a 76 y.o. male reports 1 week of fever, cough, and runny nose with a scratchy throat. Also started developing shortness of breath, weakness and feeling "groggy". He's not been any antibiotics. Reports fevers, increasing weakness and a worsening cough but not really feeling short of breath.  No pain. No nausea or vomiting. Reports poor appetite and not eating well.  No chest pain. No abdominal pain nausea or vomiting. No rash. Reports a moderate headache that come on the last few days as well, throbbing in nature.  Past Medical History:  Diagnosis Date  . Anemia   . Bipolar disorder ([place])   . Chronic diarrhea   . Colon polyps   . Depression   . Dermatophytosis   . Diabetic neuropathy ([place])   . DJD (degenerative joint disease)   . Dyspnea   . Gout   . Gross hematuria   . HLD (hyperlipidemia)   . HTN (hypertension)   . Hyperkeratosis   . Onychomycosis   . Parkinson disease ([place])   . Prostate cancer ([place])   . Sleep apnea   . Tremor   . Type 2 diabetes mellitus [facility])     Patient Active Problem List   Diagnosis Date Noted  . 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 Mass of left side of neck 10/19/2015  . Thrombocytopenia ([place]) 09/07/2015  . Bilateral edema of lower extremity 05/10/2015  . Bipolar disorder, in partial remission, most recent episode depressed ([place]) 05/10/2015  . RLS (restless legs syndrome) 03/29/2015  . Adenomatous polyp 02/24/2015  . Anemia 02/24/2015  . Affective bipolar disorder ([place]) 02/24/2015  . Chronic diarrhea 02/24/2015  . Diabetic neuropathy ([place]) 02/24/2015  . Arthritis, degenerative 02/24/2015  . 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 BP (high blood pressure) 02/24/2015  . CA of prostate ([place]) 02/24/2015  . Incontinence 02/01/2015  . History of prostate cancer 02/01/2015  . Gross hematuria 02/01/2015  . SOB (shortness of breath) 12/16/2014  . Pain in the chest 12/16/2014  . Family history of premature CAD 12/16/2014  . Parkinson's disease ([place]) 12/16/2014  . Essential hypertension 12/16/2014  . Unsteady gait 12/16/2014  . Hyperlipidemia 12/16/2014  . 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 Difficulty in walking 10/10/2014  . Difficulty sleeping 10/10/2014  . Breath shortness 05/06/2014  . Amnesia 02/28/2014  . Diabetes mellitus, type 2 ([place]) 02/15/2014  . Has a tremor 02/01/2006    Past Surgical History:  Procedure Laterality Date  . cataract surgery Bilateral   . COLONOSCOPY W/ BIOPSIES    . FOOT SURGERY    . PROSTATE SURGERY     removed  . status post implantation of artificial urinary sphincter     . TONSILLECTOMY      Prior to Admission medications   Medication Sig Start Date End Date Taking? Authorizing Provider  aspirin 81 MG tablet Take 81 mg by mouth daily.    Historical Provider, MD  atorvastatin (LIPITOR) 40 MG tablet Take 40 mg by mouth daily.    Historical Provider, MD  Colchicine 0.6 MG CAPS Take by mouth. 03/11/16   Historical Provider, MD  gabapentin (NEURONTIN) 100 MG capsule  12/12/15   Historical Provider, MD  glucose blood  test strip 1 each by Other route as needed for other. Use as instructed    Historical Provider, MD  lithium carbonate (LITHOBID) 300 MG CR tablet Take 300 mg by mouth 2 (two) times daily.    Historical Provider, MD  losartan (COZAAR) 50 MG tablet Take 50 mg by mouth daily.    Historical Provider, MD  metFORMIN (GLUCOPHAGE) 500 MG tablet Take 500 mg by mouth 2 (two) times daily with a meal.    Historical Provider, MD  MICROLET LANCETS MISC by Does not apply route.    Historical Provider, MD  mirabegron ER (MYRBETRIQ) 25 MG TB24 tablet Take 1  tablet (25 mg total) by mouth daily. 03/18/16   Nori Riis, PA-C  MITIGARE 0.6 MG CAPS  01/07/16   Historical Provider, MD  omeprazole (PRILOSEC) 40 MG capsule  01/18/16   Historical Provider, MD  rOPINIRole (REQUIP) 0.5 MG tablet TK 1 T PO Q NIGHT FOR 1 WEEK THEN TK 2 TS PO Q NIGHT 01/22/15   Historical Provider, MD  traZODone (DESYREL) 50 MG tablet TAKE 1 TO 2 TABLETS BY MOUTH EVERY NIGHT AT BEDTIME AS NEEDED FOR SLEEP 12/16/14   Historical Provider, MD    Allergies Carbidopa w-levodopa; Latex; and Vesicare [solifenacin]  Family History  Problem Relation Age of Onset  . Heart disease Mother   . Heart attack Father 41  . Hypertension Father   . Hyperlipidemia Father   . Kidney cancer Neg Hx   . Prostate cancer Neg Hx   . Bladder Cancer Neg Hx     Social History Social History  Substance Use Topics  . Smoking status: Former Smoker    Quit date: 10/24/1999  . Smokeless tobacco: Never Used  . Alcohol use No    Review of Systems Constitutional: Fatigue fevers and chills Eyes: No visual changes. ENT: No sore throat. Reports runny nose for the last week Cardiovascular: Denies chest pain. Respiratory: Denies shortness of breath. Ports cough which is productive Gastrointestinal: No abdominal pain.  No nausea, no vomiting.  No diarrhea.  No constipation. Genitourinary: Negative for dysuria. Musculoskeletal: Negative for back pain. Skin: Negative for rash. Neurological: Negative for focal weakness or numbness.  10-point ROS otherwise negative.  ____________________________________________   PHYSICAL EXAM:  VITAL SIGNS: ED Triage Vitals  Enc Vitals Group     BP 07/01/16 1902 (!) 171/50     Pulse Rate 07/01/16 1902 87     Resp 07/01/16 1902 20     Temp 07/01/16 1902 (!) 101.2 F (38.4 C)     Temp Source 07/01/16 1902 Oral     SpO2 07/01/16 1902 97 %     Weight 07/01/16 1909 180 lb (81.6 kg)     Height --      Head Circumference --      Peak Flow --      Pain Score  --      Pain Loc --      Pain Edu? --      Excl. in [place]? --     Constitutional: Alert and oriented. Appears moderately ill, coughing frequently, slightly diaphoretic and somewhat fatigued Eyes: Conjunctivae are normal though there is a slight amount of mucopurulent discharge noted about the left eyelid without surrounding erythema or edema. PERRL. EOMI. Head: Atraumatic. Nose: No congestion/rhinnorhea. Mouth/Throat: Mucous membranes are moist.  Oropharynx non-erythematous. Neck: No stridor.  No meningismus. Cardiovascular: Normal rate, regular rhythm. Grossly normal heart sounds.  Good peripheral circulation. Respiratory: Normal respiratory effort.  Frequent cough. Rales heard focally in the right lower lobe. No wheezing. Left lung clear, right upper lobe clear. Gastrointestinal: Soft and nontender. No distention.  Musculoskeletal: No lower extremity tenderness nor edema.   Neurologic:  Normal speech and language. No gross focal neurologic deficits are appreciated.  Skin:  Skin is warm, dry and intact. No rash noted. Psychiatric: Mood and affect are normal. Speech and behavior are normal.  ____________________________________________   LABS (all labs ordered are listed, but only abnormal results are displayed)  Labs Reviewed  BASIC METABOLIC PANEL - Abnormal; Notable for the following:       Result Value   Glucose, Bld 158 (*)    All other components within normal limits  CBC - Abnormal; Notable for the following:    WBC 16.7 (*)    RBC 3.24 (*)    Hemoglobin 10.7 (*)    HCT 31.3 (*)    All other components within normal limits  GLUCOSE, CAPILLARY - Abnormal; Notable for the following:    Glucose-Capillary 133 (*)    All other components within normal limits  CULTURE, BLOOD (ROUTINE X 2)  CULTURE, BLOOD (ROUTINE X 2)  URINALYSIS, COMPLETE (UACMP) WITH MICROSCOPIC  INFLUENZA PANEL BY PCR (TYPE A & B)  CBG MONITORING, ED    ____________________________________________  EKG  ED ECG REPORT I, Lisvet Rasheed, the attending physician, personally viewed and interpreted this ECG.  Date: 07/01/2016 EKG Time: 1910 Rate: 95 Rhythm: normal sinus rhythm QRS Axis: normal Intervals: normal ST/T Wave abnormalities: Inversions in the anteroseptal distribution, appeared to be related to right bundle-branch block and unlikely to represent ischemic change Conduction Disturbances: Right bundle-branch block Narrative Interpretation: Right bundle branch block  ____________________________________________  RADIOLOGY  Dg Chest Port 1 View  Result Date: 07/01/2016 CLINICAL DATA:  Acute onset of altered mental status and fever. Dizziness and headache. Initial encounter. EXAM: PORTABLE CHEST 1 VIEW COMPARISON:  CT of the chest performed 12/29/2009 FINDINGS: The lungs are well-aerated. Right suprahilar airspace opacity is somewhat more suspicious for malignancy rather than pneumonia. There is no evidence of pleural effusion or pneumothorax. The cardiomediastinal silhouette is within normal limits. No acute osseous abnormalities are seen. IMPRESSION: Right suprahilar airspace opacity is somewhat more suspicious for malignancy rather than pneumonia, though pneumonia could still have this appearance. CT of the chest would be helpful for further evaluation. Electronically Signed   By: Garald Balding M.D.   On: 07/01/2016 20:04    ____________________________________________   PROCEDURES  Procedure(s) performed: None  Procedures  Critical Care performed: No  ____________________________________________   INITIAL IMPRESSION / ASSESSMENT AND PLAN / ED COURSE  Pertinent labs & imaging results that were available during my care of the patient were reviewed by me and considered in my medical decision making (see chart for details).  Clinically appears acute infectious. Appears also probable pneumonia based on clinical exam,  fever, Rales and productive cough. Also suspicious for possible influenza as a possible precipitating factor active infection as well. He also appears to have mild conjunctivitis about the left eye.      ----------------------------------------- 8:13 PM on 07/01/2016 -----------------------------------------  Patient reports to feel better after hydration. He is agreeable to plan for admission. Reviewing his x-ray findings, I suspect most likely clinically he has right-sided pneumonia however cannot exclude mass. Discussed with the hospitalist, started on Levaquin influenza pending, further workup and care under the hospitalist service Dr. Mindi Slicker.  ____________________________________________   FINAL CLINICAL IMPRESSION(S) / ED DIAGNOSES  Final diagnoses:  Community acquired pneumonia of right lower lobe of lung ([place])  Opacity of lung on imaging study  Sepsis, due to unspecified organism [facility])      NEW MEDICATIONS STARTED DURING THIS VISIT:  New Prescriptions   No medications on file     Note:  This document was prepared using Dragon voice recognition software and may include unintentional dictation errors.     Delman Kitten, MD 07/01/16 2013

## 2016-07-01 NOTE — ED Notes (Signed)
Pt ambulatory to toilet with assistance.  

## 2016-07-01 NOTE — ED Notes (Signed)
Pt provided turkey sandwich tray and water.

## 2016-07-01 NOTE — ED Notes (Signed)
While Dr. Jannifer Franklin assess pt, pt seemed more neurologically intact, he was better able to tell Dr. Jannifer Franklin what was going on with him.  Cough since Wednesday, coughing up green sputum.  Pt is cracking jokes now, wife at bedside.

## 2016-07-01 NOTE — ED Triage Notes (Signed)
Pt in wheelchair, pt pale in color, fever 101.2, pt unable to communicate well by words. Pt c/o of dizziness and headache since this morning.

## 2016-07-01 NOTE — H&P (Signed)
[place] [facility] [place] NAME: Craig Wilson    MR#:  [id number]  DATE OF BIRTH:  [birth year]  DATE OF ADMISSION:  07/01/2016  PRIMARY CARE PHYSICIAN: Adin Hector, MD   REQUESTING/REFERRING PHYSICIAN: Jacqualine Code, MD  CHIEF COMPLAINT:   Chief Complaint  Patient presents with  . Altered Mental Status    HISTORY OF PRESENT ILLNESS:  Craig Wilson  is a 76 y.o. male who presents with Fever, cough, sore throat, sputum production, shortness of breath. These symptoms started about 4 days ago. On presentation to the ED the patient is febrile with an elevated white count meeting sepsis criteria. He is influenza A positive, but also looks like he has some pneumonia on his chest x-ray. Hospitalists were called for admission.  PAST MEDICAL HISTORY:   Past Medical History:  Diagnosis Date  . Anemia   . Bipolar disorder ([place])   . Chronic diarrhea   . Colon polyps   . Depression   . Dermatophytosis   . Diabetic neuropathy ([place])   . DJD (degenerative joint disease)   . Dyspnea   . Gout   . Gross hematuria   . HLD (hyperlipidemia)   . HTN (hypertension)   . Hyperkeratosis   . Onychomycosis   . Parkinson disease ([place])   . Prostate cancer ([place])   . Sleep apnea   . Tremor   . Type 2 diabetes mellitus ([place])     PAST SURGICAL HISTORY:   Past Surgical History:  Procedure Laterality Date  . cataract surgery Bilateral   . COLONOSCOPY W/ BIOPSIES    . FOOT SURGERY    . PROSTATE SURGERY     removed  . status post implantation of artificial urinary sphincter     . TONSILLECTOMY      SOCIAL HISTORY:   Social History  Substance Use Topics  . Smoking status: Former Smoker    Quit date: 10/24/1999  . Smokeless tobacco: Never Used  . Alcohol use No    FAMILY HISTORY:   Family History  Problem Relation Age of Onset  . Heart disease Mother   . Heart attack Father 58  . Hypertension Father   . Hyperlipidemia Father   . Kidney  cancer Neg Hx   . Prostate cancer Neg Hx   . Bladder Cancer Neg Hx     DRUG ALLERGIES:   Allergies  Allergen Reactions  . Carbidopa W-Levodopa Other (See Comments)    irrational behavior , felt zombie like  . Latex   . Vesicare [Solifenacin]     MEDICATIONS AT HOME:   Prior to Admission medications   Medication Sig Start Date End Date Taking? Authorizing Provider  aspirin 81 MG tablet Take 81 mg by mouth daily.    Historical Provider, MD  atorvastatin (LIPITOR) 40 MG tablet Take 40 mg by mouth daily.    Historical Provider, MD  Colchicine 0.6 MG CAPS Take by mouth. 03/11/16   Historical Provider, MD  gabapentin (NEURONTIN) 100 MG capsule  12/12/15   Historical Provider, MD  glucose blood test strip 1 each by Other route as needed for other. Use as instructed    Historical Provider, MD  lithium carbonate (LITHOBID) 300 MG CR tablet Take 300 mg by mouth 2 (two) times daily.    Historical Provider, MD  losartan (COZAAR) 50 MG tablet Take 50 mg by mouth daily.    Historical Provider, MD  metFORMIN (GLUCOPHAGE) 500 MG tablet Take 500 mg  by mouth 2 (two) times daily with a meal.    Historical Provider, MD  MICROLET LANCETS MISC by Does not apply route.    Historical Provider, MD  mirabegron ER (MYRBETRIQ) 25 MG TB24 tablet Take 1 tablet (25 mg total) by mouth daily. 03/18/16   Nori Riis, PA-C  MITIGARE 0.6 MG CAPS  01/07/16   Historical Provider, MD  omeprazole (PRILOSEC) 40 MG capsule  01/18/16   Historical Provider, MD  rOPINIRole (REQUIP) 0.5 MG tablet TK 1 T PO Q NIGHT FOR 1 WEEK THEN TK 2 TS PO Q NIGHT 01/22/15   Historical Provider, MD  traZODone (DESYREL) 50 MG tablet TAKE 1 TO 2 TABLETS BY MOUTH EVERY NIGHT AT BEDTIME AS NEEDED FOR SLEEP 12/16/14   Historical Provider, MD    REVIEW OF SYSTEMS:  Review of Systems  Constitutional: Positive for fever and malaise/fatigue. Negative for chills and weight loss.  HENT: Positive for sore throat. Negative for ear pain, hearing loss and  tinnitus.   Eyes: Negative for blurred vision, double vision, pain and redness.  Respiratory: Positive for cough, sputum production and shortness of breath. Negative for hemoptysis.   Cardiovascular: Negative for chest pain, palpitations, orthopnea and leg swelling.  Gastrointestinal: Negative for abdominal pain, constipation, diarrhea, nausea and vomiting.  Genitourinary: Negative for dysuria, frequency and hematuria.  Musculoskeletal: Negative for back pain, joint pain and neck pain.  Skin:       No acne, rash, or lesions  Neurological: Negative for dizziness, tremors, focal weakness and weakness.  Endo/Heme/Allergies: Negative for polydipsia. Does not bruise/bleed easily.  Psychiatric/Behavioral: Negative for depression. The patient is not nervous/anxious and does not have insomnia.      VITAL SIGNS:   Vitals:   07/01/16 1902 07/01/16 1909 07/01/16 1930  BP: (!) 171/50  135/61  Pulse: 87  94  Resp: 20  (!) 22  Temp: (!) 101.2 F (38.4 C)    TempSrc: Oral    SpO2: 97%  93%  Weight:  81.6 kg (180 lb)    Wt Readings from Last 3 Encounters:  07/01/16 81.6 kg (180 lb)  06/13/16 82.4 kg (181 lb 12 oz)  03/18/16 84.1 kg (185 lb 8 oz)    PHYSICAL EXAMINATION:  Physical Exam  Vitals reviewed. Constitutional: He is oriented to person, place, and time. He appears well-developed and well-nourished. No distress.  HENT:  Head: Normocephalic and atraumatic.  Mouth/Throat: Oropharynx is clear and moist.  Eyes: Conjunctivae and EOM are normal. Pupils are equal, round, and reactive to light. No scleral icterus.  Neck: Normal range of motion. Neck supple. No JVD present. No thyromegaly present.  Cardiovascular: Normal rate, regular rhythm and intact distal pulses.  Exam reveals no gallop and no friction rub.   No murmur heard. Respiratory: Effort normal. No respiratory distress. He has no wheezes. He has no rales.  Right-sided mild coarse breath sounds  GI: Soft. Bowel sounds are  normal. He exhibits no distension. There is no tenderness.  Musculoskeletal: Normal range of motion. He exhibits no edema.  No arthritis, no gout  Lymphadenopathy:    He has no cervical adenopathy.  Neurological: He is alert and oriented to person, place, and time. No cranial nerve deficit.  No dysarthria, no aphasia  Skin: Skin is warm and dry. No rash noted. No erythema.  Psychiatric: He has a normal mood and affect. His behavior is normal. Judgment and thought content normal.    LABORATORY PANEL:   CBC  Recent Labs Lab  07/01/16 1910  WBC 16.7*  HGB 10.7*  HCT 31.3*  PLT 222   ------------------------------------------------------------------------------------------------------------------  Chemistries   Recent Labs Lab 07/01/16 1910  NA 136  K 4.5  CL 103  CO2 27  GLUCOSE 158*  BUN 12  CREATININE 0.99  CALCIUM 9.4   ------------------------------------------------------------------------------------------------------------------  Cardiac Enzymes No results for input(s): TROPONINI in the last 168 hours. ------------------------------------------------------------------------------------------------------------------  RADIOLOGY:  Dg Chest Port 1 View  Result Date: 07/01/2016 CLINICAL DATA:  Acute onset of altered mental status and fever. Dizziness and headache. Initial encounter. EXAM: PORTABLE CHEST 1 VIEW COMPARISON:  CT of the chest performed 12/29/2009 FINDINGS: The lungs are well-aerated. Right suprahilar airspace opacity is somewhat more suspicious for malignancy rather than pneumonia. There is no evidence of pleural effusion or pneumothorax. The cardiomediastinal silhouette is within normal limits. No acute osseous abnormalities are seen. IMPRESSION: Right suprahilar airspace opacity is somewhat more suspicious for malignancy rather than pneumonia, though pneumonia could still have this appearance. CT of the chest would be helpful for further evaluation.  Electronically Signed   By: Garald Balding M.D.   On: 07/01/2016 20:04    EKG:   Orders placed or performed during the hospital encounter of 07/01/16  . EKG 12-Lead  . EKG 12-Lead  . ED EKG  . ED EKG    IMPRESSION AND PLAN:  Principal Problem:   Sepsis ([place]) - continue IV antibiotics, blood culture sent from the ED, sputum culture added, patient is hemodynamically stable. Active Problems:   CAP (community acquired pneumonia) - antibiotics and cultures as above, when necessary duo nebs, when necessary antitussive   Influenza A - Tamiflu   Essential hypertension - continue home meds   Diabetes mellitus, type 2 ([place]) - sliding scale insulin with corresponding glucose checks   Parkinson's disease ([place]) - continue home meds  All the records are reviewed and case discussed with ED provider. Management plans discussed with the patient and/or family.  DVT PROPHYLAXIS: SubQ lovenox  GI PROPHYLAXIS: PPI  ADMISSION STATUS: Inpatient  CODE STATUS: Full Code Status History    This patient does not have a recorded code status. Please follow your organizational policy for patients in this situation.      TOTAL TIME TAKING CARE OF THIS PATIENT: 45 minutes.    Alexyia Guarino Orange 07/01/2016, 8:35 PM  [facility]  Office  [phone]  CC: Primary care physician; Adin Hector, MD

## 2016-07-02 ENCOUNTER — Telehealth: Payer: Self-pay | Admitting: Cardiovascular Disease

## 2016-07-02 LAB — BASIC METABOLIC PANEL
ANION GAP: 4 — AB (ref 5–15)
BUN: 13 mg/dL (ref 6–20)
CO2: 27 mmol/L (ref 22–32)
Calcium: 8.9 mg/dL (ref 8.9–10.3)
Chloride: 103 mmol/L (ref 101–111)
Creatinine, Ser: 0.99 mg/dL (ref 0.61–1.24)
GFR calc Af Amer: >60 mL/min (ref >60)
GLUCOSE: 153 mg/dL — AB (ref 65–99)
Potassium: 4.8 mmol/L (ref 3.5–5.1)
Sodium: 134 mmol/L — ABNORMAL LOW (ref 135–145)

## 2016-07-02 LAB — GLUCOSE, CAPILLARY
Glucose-Capillary: 134 mg/dL — ABNORMAL HIGH (ref 65–99)
Glucose-Capillary: 175 mg/dL — ABNORMAL HIGH (ref 65–99)
Glucose-Capillary: 121 mg/dL — ABNORMAL HIGH (ref 65–99)
Glucose-Capillary: 241 mg/dL — ABNORMAL HIGH (ref 65–99)

## 2016-07-02 LAB — CBC
HCT: 24.1 % — ABNORMAL LOW (ref 40.0–52.0)
HEMOGLOBIN: 8.6 g/dL — AB (ref 13.0–18.0)
MCH: 33.7 pg (ref 26.0–34.0)
MCHC: 35.6 g/dL (ref 32.0–36.0)
MCV: 94.7 fL (ref 80.0–100.0)
PLATELETS: 174 10*3/uL (ref 150–440)
RBC: 2.55 MIL/uL — ABNORMAL LOW (ref 4.40–5.90)
RDW: 12.8 % (ref 11.5–14.5)
WBC: 12.6 10*3/uL — ABNORMAL HIGH (ref 3.8–10.6)

## 2016-07-02 LAB — PROCALCITONIN: Procalcitonin: 2.36 ng/mL

## 2016-07-02 MED ORDER — LITHIUM CARBONATE ER 300 MG PO TBCR
300.0000 mg | EXTENDED_RELEASE_TABLET | Freq: Two times a day (BID) | ORAL | Status: DC
  Administered 2016-07-02 – 2016-07-03 (×4): 300 mg via ORAL
  Filled 2016-07-02 (×4): qty 1

## 2016-07-02 MED ORDER — INSULIN ASPART 100 UNIT/ML ~~LOC~~ SOLN
0.0000 [IU] | Freq: Three times a day (TID) | SUBCUTANEOUS | Status: DC
  Administered 2016-07-02: 2 [IU] via SUBCUTANEOUS
  Administered 2016-07-02 – 2016-07-03 (×2): 1 [IU] via SUBCUTANEOUS
  Administered 2016-07-03: 3 [IU] via SUBCUTANEOUS
  Filled 2016-07-02: qty 1
  Filled 2016-07-02: qty 3
  Filled 2016-07-02: qty 2
  Filled 2016-07-02: qty 1

## 2016-07-02 MED ORDER — ACETAMINOPHEN 650 MG RE SUPP: 650.0000 mg | Freq: Four times a day (QID) | RECTAL | Status: DC | PRN

## 2016-07-02 MED ORDER — INSULIN ASPART 100 UNIT/ML ~~LOC~~ SOLN
0.0000 [IU] | Freq: Every day | SUBCUTANEOUS | Status: DC
  Administered 2016-07-02: 2 [IU] via SUBCUTANEOUS
  Filled 2016-07-02: qty 2

## 2016-07-02 MED ORDER — ASPIRIN EC 81 MG PO TBEC
81.0000 mg | DELAYED_RELEASE_TABLET | Freq: Every day | ORAL | Status: DC
  Administered 2016-07-02 – 2016-07-03 (×2): 81 mg via ORAL
  Filled 2016-07-02 (×2): qty 1

## 2016-07-02 MED ORDER — PNEUMOCOCCAL VAC POLYVALENT 25 MCG/0.5ML IJ INJ: 0.5000 mL | INJECTION | INTRAMUSCULAR | Status: DC

## 2016-07-02 MED ORDER — CEFTRIAXONE SODIUM-DEXTROSE 2-2.22 GM-% IV SOLR
2.0000 g | INTRAVENOUS | Status: DC
  Administered 2016-07-02: 2 g via INTRAVENOUS
  Filled 2016-07-02 (×2): qty 50

## 2016-07-02 MED ORDER — IPRATROPIUM-ALBUTEROL 0.5-2.5 (3) MG/3ML IN SOLN
3.0000 mL | RESPIRATORY_TRACT | Status: DC | PRN
Start: 2016-07-02 — End: 2016-07-03
  Administered 2016-07-02: 3 mL via RESPIRATORY_TRACT
  Filled 2016-07-02: qty 3

## 2016-07-02 MED ORDER — ENOXAPARIN SODIUM 40 MG/0.4ML ~~LOC~~ SOLN
40.0000 mg | Freq: Every day | SUBCUTANEOUS | Status: DC
  Administered 2016-07-02 (×2): 40 mg via SUBCUTANEOUS
  Filled 2016-07-02 (×3): qty 0.4

## 2016-07-02 MED ORDER — MIRABEGRON ER 25 MG PO TB24
25.0000 mg | ORAL_TABLET | Freq: Every day | ORAL | Status: DC
  Administered 2016-07-02 – 2016-07-03 (×2): 25 mg via ORAL
  Filled 2016-07-02 (×2): qty 1

## 2016-07-02 MED ORDER — ROPINIROLE HCL 1 MG PO TABS
1.5000 mg | ORAL_TABLET | Freq: Every day | ORAL | Status: DC
  Administered 2016-07-02: 1.5 mg via ORAL
  Filled 2016-07-02 (×2): qty 1
  Filled 2016-07-02: qty 6

## 2016-07-02 MED ORDER — DEXTROSE 5 % IV SOLN
500.0000 mg | INTRAVENOUS | Status: DC
  Administered 2016-07-02: 500 mg via INTRAVENOUS
  Filled 2016-07-02 (×2): qty 500

## 2016-07-02 MED ORDER — ONDANSETRON HCL 4 MG/2ML IJ SOLN: 4.0000 mg | Freq: Four times a day (QID) | INTRAMUSCULAR | Status: DC | PRN

## 2016-07-02 MED ORDER — ATORVASTATIN CALCIUM 20 MG PO TABS
40.0000 mg | ORAL_TABLET | Freq: Every day | ORAL | Status: DC
  Administered 2016-07-02 – 2016-07-03 (×2): 40 mg via ORAL
  Filled 2016-07-02 (×3): qty 2

## 2016-07-02 MED ORDER — PANTOPRAZOLE SODIUM 40 MG PO TBEC
40.0000 mg | DELAYED_RELEASE_TABLET | Freq: Every day | ORAL | Status: DC
  Administered 2016-07-02 – 2016-07-03 (×2): 40 mg via ORAL
  Filled 2016-07-02 (×2): qty 1

## 2016-07-02 MED ORDER — LOSARTAN POTASSIUM 50 MG PO TABS
50.0000 mg | ORAL_TABLET | Freq: Every day | ORAL | Status: DC
  Administered 2016-07-02 – 2016-07-03 (×2): 50 mg via ORAL
  Filled 2016-07-02 (×2): qty 1

## 2016-07-02 MED ORDER — TRAZODONE HCL 50 MG PO TABS
50.0000 mg | ORAL_TABLET | Freq: Every evening | ORAL | Status: DC | PRN
  Administered 2016-07-02: 50 mg via ORAL
  Filled 2016-07-02: qty 1

## 2016-07-02 MED ORDER — ONDANSETRON HCL 4 MG PO TABS: 4.0000 mg | ORAL_TABLET | Freq: Four times a day (QID) | ORAL | Status: DC | PRN

## 2016-07-02 MED ORDER — ERYTHROMYCIN 5 MG/GM OP OINT
TOPICAL_OINTMENT | Freq: Three times a day (TID) | OPHTHALMIC | Status: DC
  Administered 2016-07-02 – 2016-07-03 (×4): 1 via OPHTHALMIC
  Filled 2016-07-02: qty 3.5

## 2016-07-02 MED ORDER — ROPINIROLE HCL 0.25 MG PO TABS: 1.0000 mg | ORAL_TABLET | Freq: Three times a day (TID) | ORAL | Status: DC

## 2016-07-02 MED ORDER — ACETAMINOPHEN 325 MG PO TABS
650.0000 mg | ORAL_TABLET | Freq: Four times a day (QID) | ORAL | Status: DC | PRN
  Administered 2016-07-02 (×2): 650 mg via ORAL
  Filled 2016-07-02 (×2): qty 2

## 2016-07-02 MED ORDER — GUAIFENESIN-DM 100-10 MG/5ML PO SYRP
5.0000 mL | ORAL_SOLUTION | ORAL | Status: DC | PRN
  Administered 2016-07-02 – 2016-07-03 (×5): 5 mL via ORAL
  Filled 2016-07-02 (×5): qty 5

## 2016-07-02 NOTE — Telephone Encounter (Signed)
Pt calling stating the new medication (he doesn't remember the name) we asked him to cut that in half but right now his lower number on BP is 38  Pt also needs [facility] day on Losartan  Please advise he is a bit worried about sounded a bit confused he was not sure which medication we changed last time he was here

## 2016-07-02 NOTE — Progress Notes (Signed)
Craig Wilson at [place] NAME: Craig Wilson    MR#:  [id number]  DATE OF BIRTH:  1941-04-24  SUBJECTIVE:  CHIEF COMPLAINT:   Chief Complaint  Patient presents with  . Altered Mental Status   Fever and chills. REVIEW OF SYSTEMS:  Review of Systems  Constitutional: Positive for chills, fever and malaise/fatigue.  HENT: Positive for sore throat. Negative for congestion.   Eyes: Negative for blurred vision and double vision.  Respiratory: Positive for cough and shortness of breath. Negative for hemoptysis and stridor.   Cardiovascular: Negative for chest pain and leg swelling.  Gastrointestinal: Negative for abdominal pain, blood in stool, constipation, diarrhea, melena, nausea and vomiting.  Genitourinary: Negative for hematuria.  Musculoskeletal: Negative for back pain.  Neurological: Negative for dizziness, focal weakness and loss of consciousness.  Psychiatric/Behavioral: Negative for depression. The patient is not nervous/anxious.     DRUG ALLERGIES:   Allergies  Allergen Reactions  . Carbidopa W-Levodopa Other (See Comments)    irrational behavior , felt zombie like  . Latex   . Vesicare [Solifenacin]    VITALS:  Blood pressure (!) 143/47, pulse 80, temperature 99.1 F (37.3 C), temperature source Oral, resp. rate 17, height 5\' 9"  (1.753 m), weight 174 lb (78.9 kg), SpO2 99 %. PHYSICAL EXAMINATION:  Physical Exam  Constitutional: He is oriented to person, place, and time and well-developed, well-nourished, and in no distress.  HENT:  Head: Normocephalic.  Mouth/Throat: Oropharynx is clear and moist.  Eyes: Conjunctivae and EOM are normal.  Neck: Normal range of motion. Neck supple. No JVD present. No tracheal deviation present.  Cardiovascular: Normal rate, regular rhythm and normal heart sounds.  Exam reveals no gallop.   No murmur heard. Pulmonary/Chest: Effort normal. No respiratory distress. He has wheezes.    Abdominal: Soft. Bowel sounds are normal. He exhibits no distension. There is no tenderness.  Musculoskeletal: Normal range of motion. He exhibits no edema or tenderness.  Neurological: He is alert and oriented to person, place, and time. No cranial nerve deficit.  Skin: No rash noted. No erythema.  Psychiatric: Affect normal.   LABORATORY PANEL:   CBC  Recent Labs Lab 07/02/16 0548  WBC 12.6*  HGB 8.6*  HCT 24.1*  PLT 174   ------------------------------------------------------------------------------------------------------------------ Chemistries   Recent Labs Lab 07/02/16 0548  NA 134*  K 4.8  CL 103  CO2 27  GLUCOSE 153*  BUN 13  CREATININE 0.99  CALCIUM 8.9   RADIOLOGY:  Dg Chest Port 1 View  Result Date: 07/01/2016 CLINICAL DATA:  Acute onset of altered mental status and fever. Dizziness and headache. Initial encounter. EXAM: PORTABLE CHEST 1 VIEW COMPARISON:  CT of the chest performed 12/29/2009 FINDINGS: The lungs are well-aerated. Right suprahilar airspace opacity is somewhat more suspicious for malignancy rather than pneumonia. There is no evidence of pleural effusion or pneumothorax. The cardiomediastinal silhouette is within normal limits. No acute osseous abnormalities are seen. IMPRESSION: Right suprahilar airspace opacity is somewhat more suspicious for malignancy rather than pneumonia, though pneumonia could still have this appearance. CT of the chest would be helpful for further evaluation. Electronically Signed   By: Garald Balding M.D.   On: 07/01/2016 20:04   ASSESSMENT AND PLAN:   Sepsis due to CAP. continue Zithromax and Rocephin, follow-up CBC and blood culture. when necessary duo nebs, when necessary antitussive    Influenza A - Tamiflu    Essential hypertension - continue home meds   Diabetes  mellitus, type 2 ([facility]) - sliding scale insulin with corresponding glucose checks   Parkinson's disease (Craig Wilson) - continue home meds  All the records  are reviewed and case discussed with Care Management/Social Worker. Management plans discussed with the patient, family and they are in agreement.  CODE STATUS: Full code  TOTAL TIME TAKING CARE OF THIS PATIENT: 39 minutes.   More than 50% of the time was spent in counseling/coordination of care: YES  POSSIBLE D/C IN 2 DAYS, DEPENDING ON CLINICAL CONDITION.   Craig Wilson M.D on 07/02/2016 at 6:03 PM  Between 7am to 6pm - Pager - [phone]  After 6pm go to [email or web address] - Proofreader  [facility]  Office  [phone]  CC: Primary care physician; Craig Hector, MD  Note: This dictation was prepared with Dragon dictation along with smaller phrase technology. Any transcriptional errors that result from this process are unintentional.

## 2016-07-02 NOTE — ED Notes (Signed)
Mallie Mussel RN tried to call report, receiving RN was not available to take report.

## 2016-07-02 NOTE — Progress Notes (Signed)
Pharmacy Antibiotic Note  Craig Wilson is a 76 y.o. male admitted on 07/01/2016 with pneumonia.  Pharmacy has been consulted for ceftriaxone dosing.  Plan: Ceftriaxone 2 gm IV Q24H  Weight: 180 lb (81.6 kg)  Temp (24hrs), Avg:100.5 F (38.1 C), Min:99.5 F (37.5 C), Max:101.3 F (38.5 C)   Recent Labs Lab 07/01/16 1910  WBC 16.7*  CREATININE 0.99    Estimated Creatinine Clearance: 62.5 mL/min (by C-G formula based on SCr of 0.99 mg/dL).    Allergies  Allergen Reactions  . Carbidopa W-Levodopa Other (See Comments)    irrational behavior , felt zombie like  . Latex   . Vesicare [Solifenacin]     Thank you for allowing pharmacy to be a part of this patient's care.  Laural Benes, Pharm.D., BCPS Clinical Pharmacist 07/02/2016 2:16 AM

## 2016-07-02 NOTE — Telephone Encounter (Signed)
Spoke w/ pt's wife.  She reports that pt is currently hospitalized at [facility] for pneumonia.   She would like for Dr. Rockey Situ to be involved in his care. Advised her that he cannot be unless a consult is entered. She will speak w/ hospitalist and request a cardiac consult.

## 2016-07-03 LAB — CBC
HCT: 27.3 % — ABNORMAL LOW (ref 40.0–52.0)
Hemoglobin: 9.6 g/dL — ABNORMAL LOW (ref 13.0–18.0)
MCH: 33.3 pg (ref 26.0–34.0)
MCHC: 35.3 g/dL (ref 32.0–36.0)
MCV: 94.5 fL (ref 80.0–100.0)
PLATELETS: 195 10*3/uL (ref 150–440)
RBC: 2.89 MIL/uL — AB (ref 4.40–5.90)
RDW: 13.2 % (ref 11.5–14.5)
WBC: 10.9 10*3/uL — ABNORMAL HIGH (ref 3.8–10.6)

## 2016-07-03 LAB — GLUCOSE, CAPILLARY
GLUCOSE-CAPILLARY: 149 mg/dL — AB (ref 65–99)
Glucose-Capillary: 206 mg/dL — ABNORMAL HIGH (ref 65–99)

## 2016-07-03 LAB — HEMOGLOBIN A1C
HEMOGLOBIN A1C: 5.7 % — AB (ref 4.8–5.6)
MEAN PLASMA GLUCOSE: 117 mg/dL

## 2016-07-03 MED ORDER — OSELTAMIVIR PHOSPHATE 75 MG PO CAPS
75.0000 mg | ORAL_CAPSULE | Freq: Two times a day (BID) | ORAL | Status: DC
  Administered 2016-07-03: 75 mg via ORAL
  Filled 2016-07-03: qty 1

## 2016-07-03 MED ORDER — AZITHROMYCIN 500 MG PO TABS: 500.0000 mg | ORAL_TABLET | Freq: Every day | ORAL | 0 refills | Status: DC

## 2016-07-03 MED ORDER — DEXTROSE 5 % IV SOLN
500.0000 mg | INTRAVENOUS | Status: DC
  Filled 2016-07-03: qty 500

## 2016-07-03 MED ORDER — AZITHROMYCIN 500 MG PO TABS: 500.0000 mg | ORAL_TABLET | Freq: Every day | ORAL | Status: DC

## 2016-07-03 MED ORDER — OSELTAMIVIR PHOSPHATE 75 MG PO CAPS: 75.0000 mg | ORAL_CAPSULE | Freq: Two times a day (BID) | ORAL | 0 refills | Status: DC

## 2016-07-03 MED ORDER — LORATADINE 10 MG PO TABS
10.0000 mg | ORAL_TABLET | Freq: Every day | ORAL | Status: DC
  Administered 2016-07-03: 10 mg via ORAL
  Filled 2016-07-03: qty 1

## 2016-07-03 MED ORDER — GUAIFENESIN-DM 100-10 MG/5ML PO SYRP: 5.0000 mL | ORAL_SOLUTION | ORAL | 0 refills | Status: DC | PRN

## 2016-07-03 NOTE — Progress Notes (Signed)
Pt being discharged home today. PIV was removed. Discharge instructions reviewed with pt, all questions answered. Patient prescriptions were given to him to have filled at pharmacy. His follow up appointment with PCP has been made. He is leaving with all his belongings, will be transported home via family member.

## 2016-07-03 NOTE — Progress Notes (Signed)
PHARMACIST - PHYSICIAN COMMUNICATION DR:   Chen CONCERNING: Antibiotic IV to Oral Route Change Policy  RECOMMENDATION: This patient is receiving azithromycin by the intravenous route.  Based on criteria approved by the Pharmacy and Therapeutics Committee, the antibiotic(s) is/are being converted to the equivalent oral dose form(s).   DESCRIPTION: These criteria include:  Patient being treated for a respiratory tract infection, urinary tract infection, cellulitis or clostridium difficile associated diarrhea if on metronidazole  The patient is not neutropenic and does not exhibit a GI malabsorption state  The patient is eating (either orally or via tube) and/or has been taking other orally administered medications for a least 24 hours  The patient is improving clinically and has a Tmax < 100.5  If you have questions about this conversion, please contact the Pharmacy Department  []  ( [phone] )  [facility] [x]  ( [phone] )  [facility] []  ( [phone] )  [facility] []  ( [phone] )  [facility] []  ( [phone] )  [facility]   

## 2016-07-03 NOTE — Discharge Instructions (Signed)
Heart healthy and ADA diet. °

## 2016-07-03 NOTE — Care Management Important Message (Signed)
Important Message  Patient Details  Name: JAETHAN DUEL MRN: [id number] Date of Birth: [birth year]   Medicare Important Message Given:  Yes    Jolly Mango, RN 07/03/2016, 2:18 PM

## 2016-07-03 NOTE — Discharge Summary (Signed)
[facility] [facility] [place] NAME: Craig Wilson    MR#:  [id number]  DATE OF BIRTH:  05/09/1941  DATE OF ADMISSION:  07/01/2016   ADMITTING PHYSICIAN: Lance Coon, MD  DATE OF DISCHARGE: 07/03/2016 PRIMARY CARE PHYSICIAN: Tama High III, MD   ADMISSION DIAGNOSIS:  Influenza A [J10.1] Sepsis, due to unspecified organism ([place]) [A41.9] Opacity of lung on imaging study [R91.8] Community acquired pneumonia of right lower lobe of lung ([place]) [J18.1] DISCHARGE DIAGNOSIS:  Principal Problem:   Sepsis ([place]) Active Problems:   Parkinson's disease ([place])   Essential hypertension   Diabetes mellitus, type 2 ([place])   Bipolar disorder, in partial remission, most recent episode depressed ([place])   CAP (community acquired pneumonia)   Influenza A  SECONDARY DIAGNOSIS:   Past Medical History:  Diagnosis Date  . Anemia   . Bipolar disorder ([place])   . Chronic diarrhea   . Colon polyps   . Depression   . Dermatophytosis   . Diabetic neuropathy ([place])   . DJD (degenerative joint disease)   . Dyspnea   . Gout   . Gross hematuria   . HLD (hyperlipidemia)   . HTN (hypertension)   . Hyperkeratosis   . Onychomycosis   . Parkinson disease ([place])   . Prostate cancer ([place])   . Sleep apnea   . Tremor   . Type 2 diabetes mellitus ([place])    HOSPITAL COURSE:  Sepsis due to CAP. continue Zithromax and discontinue Rocephin, leukocytosis improved, negative blood culture. when necessary duo nebs, when necessary antitussive  Influenza A - Tamiflu  Essential hypertension - continue home meds Diabetes mellitus, type 2 (HCC) - sliding scale insulin with corresponding glucose checks Parkinson's disease ([place]) - continue home meds  DISCHARGE CONDITIONS:  Stable, discharge to home today. CONSULTS OBTAINED:   DRUG ALLERGIES:   Allergies  Allergen Reactions  . Carbidopa W-Levodopa Other (See Comments)    irrational behavior , felt zombie like  .  Latex   . Vesicare [Solifenacin]    DISCHARGE MEDICATIONS:   Allergies as of 07/03/2016      Reactions   Carbidopa W-levodopa Other (See Comments)   irrational behavior , felt zombie like   Latex    Vesicare [solifenacin]       Medication List    TAKE these medications   aspirin 81 MG tablet Take 81 mg by mouth daily.   atenolol 25 MG tablet Commonly known as:  TENORMIN Take 25 mg by mouth daily.   atorvastatin 40 MG tablet Commonly known as:  LIPITOR Take 40 mg by mouth daily.   azithromycin 500 MG tablet Commonly known as:  ZITHROMAX Take 1 tablet (500 mg total) by mouth daily at 6 PM.   glucose blood test strip 1 each by Other route as needed for other. Use as instructed   guaiFENesin-dextromethorphan 100-10 MG/5ML syrup Commonly known as:  ROBITUSSIN DM Take 5 mLs by mouth every 4 (four) hours as needed for cough.   lithium carbonate 300 MG CR tablet Commonly known as:  LITHOBID Take 300 mg by mouth 3 (three) times daily.   losartan 50 MG tablet Commonly known as:  COZAAR Take 50 mg by mouth daily.   metFORMIN 500 MG tablet Commonly known as:  GLUCOPHAGE Take 500 mg by mouth 2 (two) times daily with a meal.   MICROLET LANCETS Misc by Does not apply route.   mirabegron ER 25 MG Tb24 tablet Commonly known as:  MYRBETRIQ Take 1 tablet (25 mg total) by mouth daily.   MITIGARE 0.6 MG Caps Generic drug:  Colchicine Take 1 capsule by mouth daily.   Colchicine 0.6 MG Caps Take by mouth.   omeprazole 40 MG capsule Commonly known as:  PRILOSEC Take 40 mg by mouth daily.   oseltamivir 75 MG capsule Commonly known as:  TAMIFLU Take 1 capsule (75 mg total) by mouth 2 (two) times daily.   rOPINIRole 0.5 MG tablet Commonly known as:  REQUIP TAKE THREE TABLETS NIGHTLY   traZODone 50 MG tablet Commonly known as:  DESYREL TAKE 1 TO 2 TABLETS BY MOUTH EVERY NIGHT AT BEDTIME AS NEEDED FOR SLEEP        DISCHARGE INSTRUCTIONS:  See AVS  If you  experience worsening of your admission symptoms, develop shortness of breath, life threatening emergency, suicidal or homicidal thoughts you must seek medical attention immediately by calling 911 or calling your MD immediately  if symptoms less severe.  You Must read complete instructions/literature along with all the possible adverse reactions/side effects for all the Medicines you take and that have been prescribed to you. Take any new Medicines after you have completely understood and accpet all the possible adverse reactions/side effects.   Please note  You were cared for by a hospitalist during your hospital stay. If you have any questions about your discharge medications or the care you received while you were in the hospital after you are discharged, you can call the unit and asked to speak with the hospitalist on call if the hospitalist that took care of you is not available. Once you are discharged, your primary care physician will handle any further medical issues. Please note that NO REFILLS for any discharge medications will be authorized once you are discharged, as it is imperative that you return to your primary care physician (or establish a relationship with a primary care physician if you do not have one) for your aftercare needs so that they can reassess your need for medications and monitor your lab values.    On the day of Discharge:  VITAL SIGNS:  Blood pressure (!) 161/54, pulse 85, temperature 98.4 F (36.9 C), temperature source Oral, resp. rate 18, height 5\' 9"  (1.753 m), weight 174 lb (78.9 kg), SpO2 97 %. PHYSICAL EXAMINATION:  GENERAL:  76 y.o.-year-old patient lying in the bed with no acute distress.  EYES: Pupils equal, round, reactive to light and accommodation. No scleral icterus. Extraocular muscles intact.  HEENT: Head atraumatic, normocephalic. Oropharynx and nasopharynx clear.  NECK:  Supple, no jugular venous distention. No thyroid enlargement, no tenderness.    LUNGS: Normal breath sounds bilaterally, no wheezing, rales,rhonchi or crepitation. No use of accessory muscles of respiration.  CARDIOVASCULAR: S1, S2 normal. No murmurs, rubs, or gallops.  ABDOMEN: Soft, non-tender, non-distended. Bowel sounds present. No organomegaly or mass.  EXTREMITIES: No pedal edema, cyanosis, or clubbing.  NEUROLOGIC: Cranial nerves II through XII are intact. Muscle strength 5/5 in all extremities. Sensation intact. Gait not checked.  PSYCHIATRIC: The patient is alert and oriented x 3.  SKIN: No obvious rash, lesion, or ulcer.  DATA REVIEW:   CBC  Recent Labs Lab 07/03/16 0449  WBC 10.9*  HGB 9.6*  HCT 27.3*  PLT 195    Chemistries   Recent Labs Lab 07/02/16 0548  NA 134*  K 4.8  CL 103  CO2 27  GLUCOSE 153*  BUN 13  CREATININE 0.99  CALCIUM 8.9     Microbiology  Results  Results for orders placed or performed during the hospital encounter of 07/01/16  Culture, blood (Routine X 2) w Reflex to ID Panel     Status: None (Preliminary result)   Collection Time: 07/01/16  8:45 PM  Result Value Ref Range Status   Specimen Description BLOOD LEFT ANTECUBITAL  Final   Special Requests   Final    BOTTLES DRAWN AEROBIC AND ANAEROBIC  AER 14CC ANA 11CC   Culture NO GROWTH 2 DAYS  Final   Report Status PENDING  Incomplete  Culture, blood (Routine X 2) w Reflex to ID Panel     Status: None (Preliminary result)   Collection Time: 07/01/16  8:45 PM  Result Value Ref Range Status   Specimen Description BLOOD RIGHT WRIST  Final   Special Requests   Final    BOTTLES DRAWN AEROBIC AND ANAEROBIC  AER 13CC ANA 8CC   Culture NO GROWTH 2 DAYS  Final   Report Status PENDING  Incomplete    RADIOLOGY:  No results found.   Management plans discussed with the patient, family and they are in agreement.  CODE STATUS:     Code Status Orders        Start     Ordered   07/02/16 0204  Full code  Continuous     07/02/16 0203    Code Status History     Date Active Date Inactive Code Status Order ID Comments User Context   This patient has a current code status but no historical code status.      TOTAL TIME TAKING CARE OF THIS PATIENT: 33 minutes.    Demetrios Loll M.D on 07/03/2016 at 1:50 PM  Between 7am to 6pm - Pager - [phone]  After 6pm go to [email or web address] - Proofreader  [facility]  Office  [phone]  CC: Primary care physician; Adin Hector, MD   Note: This dictation was prepared with Dragon dictation along with smaller phrase technology. Any transcriptional errors that result from this process are unintentional.

## 2016-07-06 LAB — CULTURE, BLOOD (ROUTINE X 2)
CULTURE: NO GROWTH
Culture: NO GROWTH

## 2016-07-08 ENCOUNTER — Telehealth: Payer: Self-pay | Admitting: Cardiovascular Disease

## 2016-07-08 NOTE — Telephone Encounter (Signed)
Pt came by office stating he was told last time he was here that we were going to give him a new prescription where it would help his bottom number in BP  He would like to know what that was and if we can send it in to his pharmacy Please call

## 2016-07-08 NOTE — Telephone Encounter (Signed)
Left message for pt to call back  °

## 2016-08-27 NOTE — Telephone Encounter (Signed)
Note sure how I missed this/ but did we try contacting patient again ?

## 2016-08-27 NOTE — Telephone Encounter (Signed)
Left message for pt to call back  °

## 2016-09-02 ENCOUNTER — Telehealth: Payer: Self-pay | Admitting: Urology

## 2016-09-02 NOTE — Telephone Encounter (Signed)
Patient called and left a voice mail that he is having trouble getting a refill for his prescription of Myrbetriq.    Please contact him at [phone].

## 2016-09-03 NOTE — Telephone Encounter (Signed)
LMOM

## 2016-09-04 NOTE — Telephone Encounter (Signed)
LMOM

## 2016-09-04 NOTE — Telephone Encounter (Signed)
Spoke with pt in reference to getting medication. Pt stated that he will have about a 10 day lapse in medication. Made pt aware samples could be given and would be left up front. Pt voiced understanding.

## 2017-03-04 DIAGNOSIS — R931 Abnormal findings on diagnostic imaging of heart and coronary circulation: Secondary | ICD-10-CM | POA: Insufficient documentation

## 2017-03-04 NOTE — Progress Notes (Signed)
Cardiology Office Note  Date:  03/05/2017   ID:  Rashan, [place] [birth year], MRN [id number]  PCP:  Adin Hector, MD   Chief Complaint  Patient presents with  . other    6 month follow up. Meds reviewed by the pt. verbally. Pt. c/o shortness of breath & decreased blood pressure.     HPI:  Mr. Klus is a very pleasant 76 year old gentleman with history of  Parkinson's,  hypertension,  diabetes,  prostate cancer with prior radiation treatment,  hyperlipidemia  Previous CT coronary calcium score reviewed with him, score of 7 who presents for evaluation of shortness of breath, coronary artery disease, hypertension.  In follow-up he reports that he is doing well Denies any recent falls, legs continue to be weak Wife is walking on a regular basis at shopping center, he does not do any regular exercise She presents with him today, reports that he is sleeping all the time  Chronic stable mild shortness of breath with hills and stairs  He is concerned about low heart rate, low diastolic pressure We've previously recommended he hold atenolol, he continues to take half pill  Previous CT coronary calcium  score of 7  Had bout of anemia January 2018, has recovered recent hematocrit 34 B-12 running low, 230s. Not taking his supplement  Denies any leg swelling Prior history of chronic diarrhea  Lab work reviewed Total chol 157, LDL 72 HBa1C 5.8  EKG on today's visit shows normal sinus rhythm with sinus bradycardia, rate 55 bpm, right bundle branch block  Other past medical history reviewed  strong family history. Father had MI in his 47s, mother had stroke in her 81s Brother has cancer   PMH:   has a past medical history of Anemia; Bipolar disorder ([place]); Chronic diarrhea; Colon polyps; Depression; Dermatophytosis; Diabetic neuropathy ([place]); DJD (degenerative joint disease); Dyspnea; Gout; Gross hematuria; HLD (hyperlipidemia); HTN (hypertension); Hyperkeratosis;  Onychomycosis; Parkinson disease ([place]); Prostate cancer ([place]); Sleep apnea; Tremor; and Type 2 diabetes mellitus ([place]).  PSH:    Past Surgical History:  Procedure Laterality Date  . cataract surgery Bilateral   . COLONOSCOPY W/ BIOPSIES    . FOOT SURGERY    . PROSTATE SURGERY     removed  . status post implantation of artificial urinary sphincter     . TONSILLECTOMY      Current Outpatient Prescriptions  Medication Sig Dispense Refill  . aspirin 81 MG tablet Take 81 mg by mouth daily.    Marland Kitchen atenolol (TENORMIN) 25 MG tablet Take 25 mg by mouth daily.    Marland Kitchen atorvastatin (LIPITOR) 40 MG tablet Take 40 mg by mouth daily.    Marland Kitchen glucose blood test strip 1 each by Other route as needed for other. Use as instructed    . guaiFENesin-dextromethorphan (ROBITUSSIN DM) 100-10 MG/5ML syrup Take 5 mLs by mouth every 4 (four) hours as needed for cough. 236 mL 0  . lithium carbonate (LITHOBID) 300 MG CR tablet Take 300 mg by mouth 3 (three) times daily.     Marland Kitchen losartan (COZAAR) 50 MG tablet Take 50 mg by mouth daily.    . metFORMIN (GLUCOPHAGE) 500 MG tablet Take 500 mg by mouth 2 (two) times daily with a meal.    . MICROLET LANCETS MISC by Does not apply route.    . mirabegron ER (MYRBETRIQ) 25 MG TB24 tablet Take 1 tablet (25 mg total) by mouth daily. 90 tablet 4  . MITIGARE 0.6 MG CAPS Take 1  capsule by mouth daily.     Marland Kitchen omeprazole (PRILOSEC) 40 MG capsule Take 40 mg by mouth daily.     Marland Kitchen rOPINIRole (REQUIP) 0.5 MG tablet TAKE THREE TABLETS NIGHTLY  5  . traZODone (DESYREL) 50 MG tablet TAKE 1 TO 2 TABLETS BY MOUTH EVERY NIGHT AT BEDTIME AS NEEDED FOR SLEEP     No current facility-administered medications for this visit.      Allergies:   Carbidopa w-levodopa; Latex; and Vesicare [solifenacin]   Social History:  The patient  reports that he quit smoking about 17 years ago. He has never used smokeless tobacco. He reports that he does not drink alcohol or use drugs.   Family History:   family  history includes Heart attack (age of onset: 53) in his father; Heart disease in his mother; Hyperlipidemia in his father; Hypertension in his father.    Review of Systems: Review of Systems  Constitutional: Negative.   Respiratory: Negative.   Cardiovascular: Negative.   Gastrointestinal: Negative.   Musculoskeletal: Negative.        Unsteady gait  Neurological: Negative.   Psychiatric/Behavioral: Negative.   All other systems reviewed and are negative.    PHYSICAL EXAM: VS:  BP (!) 120/52 (BP Location: Left Arm, Patient Position: Sitting, Cuff Size: Normal)   Ht 5\' 10"  (1.778 m)   Wt 181 lb 8 oz (82.3 kg)   BMI 26.04 kg/m  , BMI Body mass index is 26.04 kg/m. GEN: Well nourished, well developed, in no acute distress, walks with a cane  HEENT: normal  Neck: no JVD, carotid bruits, or masses Cardiac: RRR; no murmurs, rubs, or gallops,no edema  Respiratory:  clear to auscultation bilaterally, normal work of breathing GI: soft, nontender, nondistended, + BS MS: no deformity or atrophy  Skin: warm and dry, no rash Neuro:  Strength and sensation are intact Psych: euthymic mood, full affect    Recent Labs: 07/02/2016: BUN 13; Creatinine, Ser 0.99; Potassium 4.8; Sodium 134 07/03/2016: Hemoglobin 9.6; Platelets 195    Lipid Panel No results found for: CHOL, HDL, LDLCALC, TRIG    Wt Readings from Last 3 Encounters:  03/05/17 181 lb 8 oz (82.3 kg)  07/02/16 174 lb (78.9 kg)  06/13/16 181 lb 12 oz (82.4 kg)       ASSESSMENT AND PLAN:  Essential hypertension - Plan: EKG 12-Lead Low heart rate and borderline low blood pressure, recommended he hold atenolol Recommended he monitor blood pressure and if this runs low we would decrease losartan in half  Mixed hyperlipidemia Cholesterol is at goal on the current lipid regimen. No changes to the medications were made.  Minimal underlying coronary calcifications seen on CT scan  SOB (shortness of breath) Reports stable  chronic mild shortness of breath. Recommended a regular exercise program  Parkinson's disease ([place]) Walks with a cane, Unsteady gait. recommended exercise program. Managed by neurology  Type 2 diabetes mellitus with complication, without long-term current use of insulin (HCC) Hemoglobin A1c well controlled.  Weight is up 7 pounds  Anemia, unspecified type Episode of anemia Junior 2018, recovered since then Hematocrit 34 in stable    Total encounter time more than 25 minutes  Greater than 50% was spent in counseling and coordination of care with the patient   Disposition:   F/U  12 months   No orders of the defined types were placed in this encounter.    Signed, Esmond Plants, M.D., Ph.D. 03/05/2017  [place], [place]

## 2017-03-05 ENCOUNTER — Encounter: Payer: Self-pay | Admitting: Cardiovascular Disease

## 2017-03-05 ENCOUNTER — Ambulatory Visit (INDEPENDENT_AMBULATORY_CARE_PROVIDER_SITE_OTHER): Payer: Medicare Other | Admitting: Cardiovascular Disease

## 2017-03-05 VITALS — BP 120/52 | Ht 70.0 in | Wt 181.5 lb

## 2017-03-05 DIAGNOSIS — G20 Parkinson's disease: Secondary | ICD-10-CM | POA: Diagnosis not present

## 2017-03-05 DIAGNOSIS — R931 Abnormal findings on diagnostic imaging of heart and coronary circulation: Secondary | ICD-10-CM

## 2017-03-05 DIAGNOSIS — R0602 Shortness of breath: Secondary | ICD-10-CM

## 2017-03-05 DIAGNOSIS — I10 Essential (primary) hypertension: Secondary | ICD-10-CM

## 2017-03-05 DIAGNOSIS — E782 Mixed hyperlipidemia: Secondary | ICD-10-CM

## 2017-03-05 DIAGNOSIS — E118 Type 2 diabetes mellitus with unspecified complications: Secondary | ICD-10-CM

## 2017-03-05 NOTE — Patient Instructions (Addendum)
Medication Instructions:   Please hold the atenolol  Add the B12  Labwork:  No new labs needed  Testing/Procedures:  No further testing at this time   Follow-Up: It was a pleasure seeing you in the office today. Please call us if you have new issues that need to be addressed before your next appt.  [phone]  Your physician wants you to follow-up in: 12 months.  You will receive a reminder letter in the mail two months in advance. If you don't receive a letter, please call our office to schedule the follow-up appointment.  If you need a refill on your cardiac medications before your next appointment, please call your pharmacy.

## 2017-03-17 NOTE — Progress Notes (Deleted)
03/18/2017 10:43 AM   Craig Wilson 06-14-1940 [id number]  Referring provider: Adin Hector, MD [facility] [address], [place] [address]  No chief complaint on file.   HPI: Patient is a 76 year old Caucasian male with a history of prostate cancer, incontinence (s/p AUS ~10 years ago), history of gross hematuria who presents today for a yearly follow up.    Urinary incontinence Patient states the AUS is working properly.  He is experiencing post void dribbling and urgency.  He has found the trial of Myrbetriq effective.  This is not indicated by his IPSS score, but he would like to continue the Myrbetriq.  He is also experiencing frequent urination, hard to postpone urination and getting up at night to urinate.  AUS was [facility] at [facility] in 2006.   His IPSS score today is ***.  His PVR was *** mL.  His previous IPSS score was 21/4.   His previous PVR was 33 mL.      Score:  1-7 Mild 8-19 Moderate 20-35 Severe  Prostate cancer Patient underwent RRP by Dr. Bernardo Heater, radiation by Dr. Donella Stade, and an [place] placed at [facility] in 2006.  I do not have the patient's pathology or surgical records. His most recent PSA was <0.1 ng/mL on 02/21/2016.    History of gross hematuria Patient underwent a hematuria workup with CT urogram and cystoscopy with bilateral retrogrades in 2016.  No malignancy was discovered. Patient has had no further episodes of gross hematuria.   Nocturia Patient complains of getting up 4 times nightly.  He has been diagnosed with sleep apnea, but he does not sleep with a CPAP machine.  PMH: Past Medical History:  Diagnosis Date  . Anemia   . Bipolar disorder ([place])   . Chronic diarrhea   . Colon polyps   . Depression   . Dermatophytosis   . Diabetic neuropathy ([place])   . DJD (degenerative joint disease)   . Dyspnea   . Gout   . Gross hematuria   . HLD (hyperlipidemia)   . HTN (hypertension)   . Hyperkeratosis   . Onychomycosis   .  Parkinson disease ([place])   . Prostate cancer ([place])   . Sleep apnea   . Tremor   . Type 2 diabetes mellitus [facility])     Surgical History: Past Surgical History:  Procedure Laterality Date  . cataract surgery Bilateral   . COLONOSCOPY W/ BIOPSIES    . FOOT SURGERY    . PROSTATE SURGERY     removed  . status post implantation of artificial urinary sphincter     . TONSILLECTOMY      Home Medications:  Allergies as of 03/18/2017      Reactions   Carbidopa W-levodopa Other (See Comments)   irrational behavior , felt zombie like   Latex    Vesicare [solifenacin]       Medication List       Accurate as of 03/17/17 10:43 AM. Always use your most recent med list.          aspirin 81 MG tablet Take 81 mg by mouth daily.   atorvastatin 40 MG tablet Commonly known as:  LIPITOR Take 40 mg by mouth daily.   glucose blood test strip 1 each by Other route as needed for other. Use as instructed   guaiFENesin-dextromethorphan 100-10 MG/5ML syrup Commonly known as:  ROBITUSSIN DM Take 5 mLs by mouth every 4 (four) hours as needed for cough.  lithium carbonate 300 MG CR tablet Commonly known as:  LITHOBID Take 300 mg by mouth 3 (three) times daily.   losartan 50 MG tablet Commonly known as:  COZAAR Take 50 mg by mouth daily.   metFORMIN 500 MG tablet Commonly known as:  GLUCOPHAGE Take 500 mg by mouth 2 (two) times daily with a meal.   MICROLET LANCETS Misc by Does not apply route.   mirabegron ER 25 MG Tb24 tablet Commonly known as:  MYRBETRIQ Take 1 tablet (25 mg total) by mouth daily.   MITIGARE 0.6 MG Caps Generic drug:  Colchicine Take 1 capsule by mouth daily.   omeprazole 40 MG capsule Commonly known as:  PRILOSEC Take 40 mg by mouth daily.   rOPINIRole 0.5 MG tablet Commonly known as:  REQUIP TAKE THREE TABLETS NIGHTLY   traZODone 50 MG tablet Commonly known as:  DESYREL TAKE 1 TO 2 TABLETS BY MOUTH EVERY NIGHT AT BEDTIME AS NEEDED FOR SLEEP        Allergies:  Allergies  Allergen Reactions  . Carbidopa W-Levodopa Other (See Comments)    irrational behavior , felt zombie like  . Latex   . Vesicare [Solifenacin]     Family History: Family History  Problem Relation Age of Onset  . Heart disease Mother   . Heart attack Father 70  . Hypertension Father   . Hyperlipidemia Father   . Kidney cancer Neg Hx   . Prostate cancer Neg Hx   . Bladder Cancer Neg Hx     Social History:  reports that he quit smoking about 17 years ago. He has never used smokeless tobacco. He reports that he does not drink alcohol or use drugs.  ROS:                                        Physical Exam: There were no vitals taken for this visit.  Constitutional: Well nourished. Alert and oriented, No acute distress. HEENT: [place] AT, moist mucus membranes. Trachea midline, no masses. Cardiovascular: No clubbing, cyanosis, or edema. Respiratory: Normal respiratory effort, no increased work of breathing. GI: Abdomen is soft, non tender, non distended, no abdominal masses. Liver and spleen not palpable.  No hernias appreciated.  Stool sample for occult testing is not indicated.   GU: No CVA tenderness.  No bladder fullness or masses.  Patient with circumcised phallus.  Urethral meatus is patent.  No penile discharge. No penile lesions or rashes. Scrotum without lesions, cysts, rashes and/or edema.  Testicles are located scrotally bilaterally. No masses are appreciated in the testicles. Left and right epididymis are normal.  AUS pump located in the right scrotum.   Rectal: Patient with  normal sphincter tone. Anus and perineum without scarring or rashes. No rectal masses are appreciated. Prostate is surgically absent. Seminal vesicles are surgically absent. Skin: No rashes, bruises or suspicious lesions. Lymph: No cervical or inguinal adenopathy. Neurologic: Grossly intact, no focal deficits, moving all 4 extremities. Psychiatric:  Normal mood and affect.    Laboratory Data: Lab Results  Component Value Date   WBC 10.9 (H) 07/03/2016   HGB 9.6 (L) 07/03/2016   HCT 27.3 (L) 07/03/2016   MCV 94.5 07/03/2016   PLT 195 07/03/2016    Lab Results  Component Value Date   CREATININE 0.99 07/02/2016     PSA history:  0.01 ng/mL on 02/15/2014  0.01 ng/mL on 08/18/2014  <  0.1  ng/mL on 02/21/2016  I have reviewed the labs.  Pertinent imaging ***   Assessment & Plan:    1. Incontinence  - IPSS score is 21/4, it is worsening- but patient states it has improved and want to continue the medication  - AUS is functioning properly  - prescription of Myrbetriq 25 mg is given at this time, as well as samples  - RTC in one year for IPSS and PVR  - BLADDER SCAN AMB NON-IMAGING  2. History of prostate cancer  - Patient underwent a RRP with adjunctive radiation in 2005. His most recent PSA was  <0.1 ng/mL on 02/21/2016.    - RTC in one year for PSA  3. History of hematuria  - Patient has completed a hematuria workup with CT urogram and cystoscopy with bilateral retrograde in 2016.   No GU malignancies were found.   He has not had any further gross hematuria.     4. Nocturia  - Encouraged the patient to undergo a sleep study  No Follow-up on file.  Zara Council, [facility] [address], [place], [place] [address] [phone]

## 2017-03-18 ENCOUNTER — Ambulatory Visit: Payer: Medicare Other | Admitting: Urology

## 2017-03-24 ENCOUNTER — Other Ambulatory Visit: Payer: Self-pay | Admitting: Urology

## 2017-04-10 DIAGNOSIS — M6530 Trigger finger, unspecified finger: Secondary | ICD-10-CM | POA: Insufficient documentation

## 2017-06-23 ENCOUNTER — Other Ambulatory Visit
Admission: RE | Admit: 2017-06-23 | Discharge: 2017-06-23 | Disposition: A | Discharge status: Home / Self Care | Payer: Medicare Other | Source: Ambulatory Visit | Attending: Internal Medicine | Admitting: Internal Medicine

## 2017-06-23 DIAGNOSIS — R0609 Other forms of dyspnea: Secondary | ICD-10-CM | POA: Insufficient documentation

## 2017-06-23 LAB — LITHIUM LEVEL: LITHIUM LVL: 0.65 mmol/L (ref 0.60–1.20)

## 2017-06-23 LAB — TROPONIN I

## 2017-06-23 LAB — FIBRIN DERIVATIVES D-DIMER (ARMC ONLY): Fibrin derivatives D-dimer (ARMC): 299.28 ng/mL (FEU) (ref 0.00–499.00)

## 2017-06-27 DIAGNOSIS — J438 Other emphysema: Secondary | ICD-10-CM | POA: Insufficient documentation

## 2017-10-24 ENCOUNTER — Ambulatory Visit: Admit: 2017-10-24 | Payer: Medicare Other | Admitting: Unknown Physician Specialty

## 2017-10-24 SURGERY — COLONOSCOPY WITH PROPOFOL
Anesthesia: General

## 2017-11-20 ENCOUNTER — Telehealth: Payer: Self-pay | Admitting: Urology

## 2017-11-20 NOTE — Telephone Encounter (Signed)
Craig Wilson has not been seen by a provider since 03/2016.  We cannot provide samples or call in a refill until he is seen.

## 2017-11-20 NOTE — Telephone Encounter (Signed)
Pt would like refill (Mybetriq) before appt if able, if not pt asks if he may get some samples until his appt. Please advise pt.

## 2017-11-20 NOTE — Telephone Encounter (Signed)
Pt informed of this information.

## 2017-12-30 NOTE — Progress Notes (Signed)
12/31/2017 3:17 PM   [place] 04-Jun-1940 [id number]  Referring provider: Adin Hector, MD [place] [facility] [place], [place] [address]  Chief Complaint  Patient presents with  . Urinary Incontinence    HPI: Patient is a 77 year old Caucasian male with a history of prostate cancer, incontinence (s/p AUS ~10 years ago), history of gross hematuria who presents today for a yearly follow up.    Urinary incontinence Patient states the AUS is working properly.  He is experiencing post void dribbling and urgency.  He has found the trial of Myrbetriq effective.  He has been without the Myrbetriq 25 mg for over one year.  AUS was placed at [facility] in 2006. His IPSS score today is 15/1.  His PVR was 0 mL.  His previous IPSS score was 21/4.   His previous PVR was 33 mL.    IPSS    Row Name 12/31/17 1400         International Prostate Symptom Score   How often have you had the sensation of not emptying your bladder?  Almost always     How often have you had to urinate less than every two hours?  Less than half the time     How often have you found you stopped and started again several times when you urinated?  More than half the time     How often have you found it difficult to postpone urination?  Not at All     How often have you had a weak urinary stream?  Less than 1 in 5 times     How often have you had to strain to start urination?  Not at All     How many times did you typically get up at night to urinate?  3 Times     Total IPSS Score  15       Quality of Life due to urinary symptoms   If you were to spend the rest of your life with your urinary condition just the way it is now how would you feel about that?  Pleased        Score:  1-7 Mild 8-19 Moderate 20-35 Severe  Prostate cancer Patient underwent RRP by Dr. Bernardo Heater, radiation by Dr. Donella Stade, and an [place] placed at [facility] in 2006.  I do not have the patient's pathology or surgical records.  His most recent PSA was <0.1 ng/mL on 02/21/2016.    History of gross hematuria Patient underwent a hematuria workup with CT urogram and cystoscopy with bilateral retrogrades in 2016.  No malignancy was discovered. Patient had an episode of gross hematuria.  He has had an incident of pink colored urine about two weeks ago.    Nocturia Patient complains of getting up 4 times nightly.  He has been diagnosed with sleep apnea, but he does not sleep with a CPAP machine.  PMH: Past Medical History:  Diagnosis Date  . Anemia   . Bipolar disorder ([place])   . Chronic diarrhea   . Colon polyps   . Depression   . Dermatophytosis   . Diabetic neuropathy ([place])   . DJD (degenerative joint disease)   . Dyspnea   . Gout   . Gross hematuria   . HLD (hyperlipidemia)   . HTN (hypertension)   . Hyperkeratosis   . Onychomycosis   . Parkinson disease ([place])   . Prostate cancer ([place])   . Sleep apnea   . Tremor   .  Type 2 diabetes mellitus [facility])     Surgical History: Past Surgical History:  Procedure Laterality Date  . cataract surgery Bilateral   . COLONOSCOPY W/ BIOPSIES    . FOOT SURGERY    . PROSTATE SURGERY     removed  . status post implantation of artificial urinary sphincter     . TONSILLECTOMY      Home Medications:  Allergies as of 12/31/2017      Reactions   Carbidopa W-levodopa Other (See Comments)   irrational behavior , felt zombie like   Latex    Vesicare [solifenacin]       Medication List        Accurate as of 12/31/17  3:17 PM. Always use your most recent med list.          aspirin 81 MG tablet Take 81 mg by mouth daily.   atorvastatin 40 MG tablet Commonly known as:  LIPITOR Take 40 mg by mouth daily.   glucose blood test strip 1 each by Other route as needed for other. Use as instructed   guaiFENesin-dextromethorphan 100-10 MG/5ML syrup Commonly known as:  ROBITUSSIN DM Take 5 mLs by mouth every 4 (four) hours as needed for cough.   lithium carbonate  300 MG CR tablet Commonly known as:  LITHOBID Take 300 mg by mouth 3 (three) times daily.   losartan 50 MG tablet Commonly known as:  COZAAR Take 50 mg by mouth daily.   metFORMIN 500 MG tablet Commonly known as:  GLUCOPHAGE Take 500 mg by mouth 2 (two) times daily with a meal.   MICROLET LANCETS Misc by Does not apply route.   mirabegron ER 25 MG Tb24 tablet Commonly known as:  MYRBETRIQ Take 1 tablet (25 mg total) by mouth daily.   MITIGARE 0.6 MG Caps Generic drug:  Colchicine Take 1 capsule by mouth daily.   omeprazole 40 MG capsule Commonly known as:  PRILOSEC Take 40 mg by mouth daily.   rOPINIRole 0.5 MG tablet Commonly known as:  REQUIP TAKE THREE TABLETS NIGHTLY   traZODone 50 MG tablet Commonly known as:  DESYREL TAKE 1 TO 2 TABLETS BY MOUTH EVERY NIGHT AT BEDTIME AS NEEDED FOR SLEEP       Allergies:  Allergies  Allergen Reactions  . Carbidopa W-Levodopa Other (See Comments)    irrational behavior , felt zombie like  . Latex   . Vesicare [Solifenacin]     Family History: Family History  Problem Relation Age of Onset  . Heart disease Mother   . Heart attack Father 51  . Hypertension Father   . Hyperlipidemia Father   . Kidney cancer Neg Hx   . Prostate cancer Neg Hx   . Bladder Cancer Neg Hx     Social History:  reports that he quit smoking about 18 years ago. He has never used smokeless tobacco. He reports that he does not drink alcohol or use drugs.  ROS: UROLOGY Frequent Urination?: Yes Hard to postpone urination?: No Burning/pain with urination?: No Get up at night to urinate?: Yes Leakage of urine?: No Urine stream starts and stops?: Yes Trouble starting stream?: No Do you have to strain to urinate?: No Blood in urine?: Yes Urinary tract infection?: No Sexually transmitted disease?: No Injury to kidneys or bladder?: No Painful intercourse?: No Weak stream?: No Erection problems?: No Penile pain?:  No  Gastrointestinal Nausea?: No Vomiting?: No Indigestion/heartburn?: No Diarrhea?: No Constipation?: Yes  Constitutional Fever: No Night sweats?: No Weight loss?:  No Fatigue?: No  Skin Skin rash/lesions?: No Itching?: No  Eyes Blurred vision?: No Double vision?: No  Ears/Nose/Throat Sore throat?: No Sinus problems?: No  Hematologic/Lymphatic Swollen glands?: No Easy bruising?: No  Cardiovascular Leg swelling?: No Chest pain?: No  Respiratory Cough?: No Shortness of breath?: No  Endocrine Excessive thirst?: No  Musculoskeletal Back pain?: No Joint pain?: No  Neurological Headaches?: No Dizziness?: No  Psychologic Depression?: No Anxiety?: No  Physical Exam: BP (!) 147/71 (BP Location: Left Arm, Patient Position: Sitting, Cuff Size: Normal)   Pulse 60   Ht 5\' 10"  (1.778 m)   Wt 183 lb 14.4 oz (83.4 kg)   BMI 26.39 kg/m   Constitutional: Well nourished. Alert and oriented, No acute distress. HEENT: [place] AT, moist mucus membranes. Trachea midline, no masses. Cardiovascular: No clubbing, cyanosis, or edema. Respiratory: Normal respiratory effort, no increased work of breathing. GI: Abdomen is soft, non tender, non distended, no abdominal masses. Liver and spleen not palpable.  No hernias appreciated.  Stool sample for occult testing is not indicated.   GU: No CVA tenderness.  No bladder fullness or masses.  Patient with circumcised phallus.  Urethral meatus is patent.  No penile discharge. No penile lesions or rashes. Scrotum without lesions, cysts, rashes and/or edema.  Testicles are located scrotally bilaterally. No masses are appreciated in the testicles. Left and right epididymis are normal.  AUS pump located in the right scrotum.   Rectal: Not performed.   Skin: No rashes, bruises or suspicious lesions. Lymph: No cervical or inguinal adenopathy. Neurologic: Grossly intact, no focal deficits, moving all 4 extremities. Psychiatric: Normal mood  and affect.     Laboratory Data: Lab Results  Component Value Date   WBC 10.9 (H) 07/03/2016   HGB 9.6 (L) 07/03/2016   HCT 27.3 (L) 07/03/2016   MCV 94.5 07/03/2016   PLT 195 07/03/2016    Lab Results  Component Value Date   CREATININE 0.99 07/02/2016     PSA history:  0.01 ng/mL on 02/15/2014  0.01 ng/mL on 08/18/2014  <0.1  ng/mL on 02/21/2016  I have reviewed the labs.  Pertinent imaging Results for AKIN, YI (MRN [id number]) as of 12/31/2017 15:16  Ref. Range 12/31/2017 14:52  Scan Result Unknown 0   Assessment & Plan:    1. Incontinence I PSS score is 15/1, it is improving Prescription of Myrbetriq 25 mg is given at this time, as well as samples BLADDER SCAN AMB NON-IMAGING  2. History of prostate cancer  - Patient underwent a RRP with adjunctive radiation in 2005. His most recent PSA was  <0.1 ng/mL on 02/21/2016.    - PSA drawn today  3. History of hematuria  - Patient has completed a hematuria workup with CT urogram and cystoscopy with bilateral retrograde in 2016.   No GU malignancies were found.   He had an episode of gross hematuria.   Recommend to the patient to repeat hematuria work up with CTU and cystoscopy - he agrees to the workup  4. Nocturia   Improved with Myrbetriq  Return for CT Urogram report and cystoscopy; patient prefers Dr. Erlene Quan .  Zara Council, PA-C  [facility] [address] [place], [place] [address] [phone]

## 2017-12-31 ENCOUNTER — Other Ambulatory Visit: Payer: Self-pay | Admitting: Family Medicine

## 2017-12-31 ENCOUNTER — Ambulatory Visit (INDEPENDENT_AMBULATORY_CARE_PROVIDER_SITE_OTHER): Payer: Medicare Other | Admitting: Urology

## 2017-12-31 ENCOUNTER — Encounter: Payer: Self-pay | Admitting: Urology

## 2017-12-31 VITALS — BP 147/71 | HR 60 | Ht 70.0 in | Wt 183.9 lb

## 2017-12-31 DIAGNOSIS — Z87448 Personal history of other diseases of urinary system: Secondary | ICD-10-CM

## 2017-12-31 DIAGNOSIS — R351 Nocturia: Secondary | ICD-10-CM | POA: Diagnosis not present

## 2017-12-31 DIAGNOSIS — N39498 Other specified urinary incontinence: Secondary | ICD-10-CM

## 2017-12-31 DIAGNOSIS — Z8546 Personal history of malignant neoplasm of prostate: Secondary | ICD-10-CM | POA: Diagnosis not present

## 2017-12-31 LAB — BLADDER SCAN AMB NON-IMAGING: Scan Result: 0

## 2017-12-31 MED ORDER — MIRABEGRON ER 25 MG PO TB24: 25.0000 mg | ORAL_TABLET | Freq: Every day | ORAL | 0 refills | Status: DC

## 2018-01-01 ENCOUNTER — Encounter: Payer: Self-pay | Admitting: Urology

## 2018-01-01 ENCOUNTER — Other Ambulatory Visit: Payer: Medicare Other

## 2018-01-01 DIAGNOSIS — N39498 Other specified urinary incontinence: Secondary | ICD-10-CM

## 2018-01-01 LAB — URINALYSIS, COMPLETE
BILIRUBIN UA: NEGATIVE
Glucose, UA: NEGATIVE
KETONES UA: NEGATIVE
LEUKOCYTES UA: NEGATIVE
NITRITE UA: NEGATIVE
PH UA: 7 (ref 5.0–7.5)
Protein, UA: NEGATIVE
RBC UA: NEGATIVE
Specific Gravity, UA: 1.015 (ref 1.005–1.030)
UUROB: 0.2 mg/dL (ref 0.2–1.0)

## 2018-01-01 LAB — BUN+CREAT
BUN / CREAT RATIO: 17 (ref 10–24)
BUN: 18 mg/dL (ref 8–27)
Creatinine, Ser: 1.03 mg/dL (ref 0.76–1.27)
GFR calc non Af Amer: 69 mL/min/{1.73_m2} (ref >59)
GFR, EST AFRICAN AMERICAN: 79 mL/min/{1.73_m2} (ref >59)

## 2018-01-01 LAB — MICROSCOPIC EXAMINATION
BACTERIA UA: NONE SEEN
EPITHELIAL CELLS (NON RENAL): NONE SEEN /HPF (ref 0–10)
WBC UA: NONE SEEN /HPF (ref 0–5)

## 2018-01-04 LAB — CULTURE, URINE COMPREHENSIVE

## 2018-01-09 ENCOUNTER — Telehealth: Payer: Self-pay

## 2018-01-09 LAB — PSA

## 2018-01-09 NOTE — Telephone Encounter (Signed)
-----   Message from Nori Riis, PA-C sent at 01/09/2018  7:38 AM EDT ----- For some reason, his PSA was canceled.  We need him to have his blood drawn for a PSA.

## 2018-01-09 NOTE — Telephone Encounter (Signed)
Called pt informed him of the need to come back into office and have PSA drawn.

## 2018-01-09 NOTE — Telephone Encounter (Signed)
Pt thought he had psa drawn at pcp, okie called pcp ad he has not. I tried to call pt, he did not answer

## 2018-01-12 ENCOUNTER — Other Ambulatory Visit: Payer: Medicare Other

## 2018-01-12 ENCOUNTER — Telehealth: Payer: Self-pay

## 2018-01-12 DIAGNOSIS — Z8546 Personal history of malignant neoplasm of prostate: Secondary | ICD-10-CM

## 2018-01-12 NOTE — Telephone Encounter (Signed)
psa drawn today

## 2018-01-12 NOTE — Telephone Encounter (Signed)
-----   Message from Nori Riis, PA-C sent at 01/09/2018  7:38 AM EDT ----- For some reason, his PSA was canceled.  We need him to have his blood drawn for a PSA.

## 2018-01-13 LAB — PSA

## 2018-01-14 ENCOUNTER — Emergency Department
Admission: EM | Admit: 2018-01-14 | Discharge: 2018-01-14 | Disposition: A | Discharge status: Home / Self Care | Payer: Medicare Other | Attending: Student in an Organized Health Care Education/Training Program | Admitting: Student in an Organized Health Care Education/Training Program

## 2018-01-14 ENCOUNTER — Other Ambulatory Visit: Payer: Self-pay

## 2018-01-14 ENCOUNTER — Emergency Department: Payer: Medicare Other

## 2018-01-14 ENCOUNTER — Other Ambulatory Visit: Payer: Self-pay | Admitting: Family Medicine

## 2018-01-14 ENCOUNTER — Encounter: Payer: Self-pay | Admitting: *Deleted

## 2018-01-14 ENCOUNTER — Telehealth: Payer: Self-pay

## 2018-01-14 DIAGNOSIS — J111 Influenza due to unidentified influenza virus with other respiratory manifestations: Secondary | ICD-10-CM

## 2018-01-14 DIAGNOSIS — Z79899 Other long term (current) drug therapy: Secondary | ICD-10-CM | POA: Insufficient documentation

## 2018-01-14 DIAGNOSIS — G20 Parkinson's disease: Secondary | ICD-10-CM | POA: Insufficient documentation

## 2018-01-14 DIAGNOSIS — Z87891 Personal history of nicotine dependence: Secondary | ICD-10-CM | POA: Diagnosis not present

## 2018-01-14 DIAGNOSIS — R69 Illness, unspecified: Secondary | ICD-10-CM

## 2018-01-14 DIAGNOSIS — R410 Disorientation, unspecified: Secondary | ICD-10-CM | POA: Insufficient documentation

## 2018-01-14 DIAGNOSIS — Z91040 Latex allergy status: Secondary | ICD-10-CM | POA: Insufficient documentation

## 2018-01-14 DIAGNOSIS — Z794 Long term (current) use of insulin: Secondary | ICD-10-CM | POA: Insufficient documentation

## 2018-01-14 DIAGNOSIS — R509 Fever, unspecified: Secondary | ICD-10-CM

## 2018-01-14 DIAGNOSIS — I10 Essential (primary) hypertension: Secondary | ICD-10-CM | POA: Insufficient documentation

## 2018-01-14 DIAGNOSIS — R51 Headache: Secondary | ICD-10-CM | POA: Insufficient documentation

## 2018-01-14 DIAGNOSIS — Z8546 Personal history of malignant neoplasm of prostate: Secondary | ICD-10-CM | POA: Insufficient documentation

## 2018-01-14 DIAGNOSIS — E1140 Type 2 diabetes mellitus with diabetic neuropathy, unspecified: Secondary | ICD-10-CM | POA: Insufficient documentation

## 2018-01-14 DIAGNOSIS — Z7982 Long term (current) use of aspirin: Secondary | ICD-10-CM | POA: Diagnosis not present

## 2018-01-14 LAB — URINALYSIS, COMPLETE (UACMP) WITH MICROSCOPIC
BACTERIA UA: NONE SEEN
Bilirubin Urine: NEGATIVE
Glucose, UA: NEGATIVE mg/dL
KETONES UR: NEGATIVE mg/dL
Leukocytes, UA: NEGATIVE
Nitrite: NEGATIVE
PROTEIN: NEGATIVE mg/dL
Specific Gravity, Urine: 1.01 (ref 1.005–1.030)
pH: 7 (ref 5.0–8.0)

## 2018-01-14 LAB — CBC WITH DIFFERENTIAL/PLATELET
Basophils Absolute: 0 10*3/uL (ref 0–0.1)
Basophils Relative: 0 %
EOS PCT: 1 %
Eosinophils Absolute: 0.1 10*3/uL (ref 0–0.7)
HEMATOCRIT: 33.8 % — AB (ref 40.0–52.0)
HEMOGLOBIN: 11.7 g/dL — AB (ref 13.0–18.0)
LYMPHS ABS: 0.3 10*3/uL — AB (ref 1.0–3.6)
LYMPHS PCT: 2 %
MCH: 34.3 pg — AB (ref 26.0–34.0)
MCHC: 34.7 g/dL (ref 32.0–36.0)
MCV: 99 fL (ref 80.0–100.0)
Monocytes Absolute: 0.5 10*3/uL (ref 0.2–1.0)
Monocytes Relative: 4 %
NEUTROS ABS: 12.1 10*3/uL — AB (ref 1.4–6.5)
NEUTROS PCT: 93 %
Platelets: 128 10*3/uL — ABNORMAL LOW (ref 150–440)
RBC: 3.42 MIL/uL — AB (ref 4.40–5.90)
RDW: 13.1 % (ref 11.5–14.5)
WBC: 13 10*3/uL — AB (ref 3.8–10.6)

## 2018-01-14 LAB — COMPREHENSIVE METABOLIC PANEL
ALT: 17 U/L (ref 0–44)
AST: 13 U/L — ABNORMAL LOW (ref 15–41)
Albumin: 4.2 g/dL (ref 3.5–5.0)
Alkaline Phosphatase: 89 U/L (ref 38–126)
Anion gap: 5 (ref 5–15)
BUN: 18 mg/dL (ref 8–23)
CHLORIDE: 105 mmol/L (ref 98–111)
CO2: 26 mmol/L (ref 22–32)
CREATININE: 1.14 mg/dL (ref 0.61–1.24)
Calcium: 9.3 mg/dL (ref 8.9–10.3)
GFR calc Af Amer: >60 mL/min (ref >60)
GFR calc non Af Amer: 59 mL/min — ABNORMAL LOW (ref >60)
Glucose, Bld: 196 mg/dL — ABNORMAL HIGH (ref 70–99)
Potassium: 4.5 mmol/L (ref 3.5–5.1)
SODIUM: 136 mmol/L (ref 135–145)
Total Bilirubin: 0.9 mg/dL (ref 0.3–1.2)
Total Protein: 6.8 g/dL (ref 6.5–8.1)

## 2018-01-14 LAB — LACTIC ACID, PLASMA: LACTIC ACID, VENOUS: 1.9 mmol/L (ref 0.5–1.9)

## 2018-01-14 LAB — TROPONIN I: Troponin I: <0.03 ng/mL (ref <0.03)

## 2018-01-14 LAB — GLUCOSE, CAPILLARY: GLUCOSE-CAPILLARY: 181 mg/dL — AB (ref 70–99)

## 2018-01-14 LAB — INFLUENZA PANEL BY PCR (TYPE A & B)
INFLBPCR: NEGATIVE
Influenza A By PCR: NEGATIVE

## 2018-01-14 MED ORDER — DOXYCYCLINE HYCLATE 100 MG PO TABS: 100.0000 mg | ORAL_TABLET | Freq: Two times a day (BID) | ORAL | 0 refills | Status: AC

## 2018-01-14 MED ORDER — MIRABEGRON ER 25 MG PO TB24: 25.0000 mg | ORAL_TABLET | Freq: Every day | ORAL | 4 refills | Status: DC

## 2018-01-14 MED ORDER — ACETAMINOPHEN 500 MG PO TABS
1000.0000 mg | ORAL_TABLET | Freq: Once | ORAL | Status: AC
  Administered 2018-01-14: 1000 mg via ORAL
  Filled 2018-01-14: qty 2

## 2018-01-14 MED ORDER — DOXYCYCLINE HYCLATE 100 MG PO TABS
100.0000 mg | ORAL_TABLET | Freq: Once | ORAL | Status: AC
  Administered 2018-01-14: 100 mg via ORAL
  Filled 2018-01-14: qty 1

## 2018-01-14 NOTE — ED Triage Notes (Signed)
Pt to triage via wheelchair.  Pt has a fever.  Wife reports at 67 today pt was staring off into space and seemed confused.  Pt has a headache.  Pt alert.

## 2018-01-14 NOTE — ED Provider Notes (Signed)
[facility] Emergency Department Provider Note    First MD Initiated Contact with Patient 01/14/18 1920     (approximate)  I have reviewed the triage vital signs and the nursing notes.   HISTORY  Chief Complaint Fever    HPI Craig Wilson is a 77 y.o. male with below listed past medical history presents the ER for concern for fever and mild confusion.  Patient does have a history of dementia and Parkinson's.  Wife currently with him says that she was recently sick with nasal congestion and cough.  Patient states he does have a mild headache.  No neck stiffness.  No dysuria.  No abdominal pain.  States is not taking anything for fever.  Wife states that she prefers to take the patient home but wants to evaluate make sure there is no other significant dehydration.   Past Medical History:  Diagnosis Date  . Anemia   . Bipolar disorder ([place])   . Chronic diarrhea   . Colon polyps   . Depression   . Dermatophytosis   . Diabetic neuropathy ([place])   . DJD (degenerative joint disease)   . Dyspnea   . Gout   . Gross hematuria   . HLD (hyperlipidemia)   . HTN (hypertension)   . Hyperkeratosis   . Onychomycosis   . Parkinson disease ([place])   . Prostate cancer ([place])   . Sleep apnea   . Tremor   . Type 2 diabetes mellitus ([facility])    Family History  Problem Relation Age of Onset  . Heart disease Mother   . Heart attack Father 50  . Hypertension Father   . Hyperlipidemia Father   . Kidney cancer Neg Hx   . Prostate cancer Neg Hx   . Bladder Cancer Neg Hx    Past Surgical History:  Procedure Laterality Date  . cataract surgery Bilateral   . COLONOSCOPY W/ BIOPSIES    . FOOT SURGERY    . PROSTATE SURGERY     removed  . status post implantation of artificial urinary sphincter     . TONSILLECTOMY     Patient Active Problem List   Diagnosis Date Noted  . Elevated coronary artery calcium score 03/04/2017  . Sepsis ([place]) 07/01/2016  . CAP  (community acquired pneumonia) 07/01/2016  . Influenza A 07/01/2016  . 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 Mass of left side of neck 10/19/2015  . Thrombocytopenia ([place]) 09/07/2015  . Bilateral edema of lower extremity 05/10/2015  . Bipolar disorder, in partial remission, most recent episode depressed ([place]) 05/10/2015  . RLS (restless legs syndrome) 03/29/2015  . Adenomatous polyp 02/24/2015  . Anemia 02/24/2015  . Affective bipolar disorder ([place]) 02/24/2015  . Chronic diarrhea 02/24/2015  . Diabetic neuropathy ([place]) 02/24/2015  . Arthritis, degenerative 02/24/2015  . 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 BP (high blood pressure) 02/24/2015  . CA of prostate ([place]) 02/24/2015  . Incontinence 02/01/2015  . History of prostate cancer 02/01/2015  . Gross hematuria 02/01/2015  . SOB (shortness of breath) 12/16/2014  . Pain in the chest 12/16/2014  . Family history of premature CAD 12/16/2014  . Parkinson's disease ([place]) 12/16/2014  . Essential hypertension 12/16/2014  . Unsteady gait 12/16/2014  . Hyperlipidemia 12/16/2014  . 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 Difficulty in walking 10/10/2014  . Difficulty sleeping 10/10/2014  . Breath shortness 05/06/2014  . Amnesia 02/28/2014  . Diabetes mellitus, type 2 ([place]) 02/15/2014  . Has a tremor 02/01/2006  Prior to Admission medications   Medication Sig Start Date End Date Taking? Authorizing Provider  aspirin 81 MG tablet Take 81 mg by mouth daily.    [provider]  atorvastatin (LIPITOR) 40 MG tablet Take 40 mg by mouth daily.    [provider]  doxycycline (VIBRA-TABS) 100 MG tablet Take 1 tablet (100 mg total) by mouth 2 (two) times daily for 7 days. 01/14/18 01/21/18  Merlyn Lot, MD  glucose blood test strip 1 each by Other route as needed for other. Use as instructed    [provider]  guaiFENesin-dextromethorphan (ROBITUSSIN DM) 100-10 MG/5ML syrup Take 5 mLs by mouth every 4 (four) hours  as needed for cough. Patient not taking: Reported on 12/31/2017 07/03/16   Demetrios Loll, MD  lithium carbonate (LITHOBID) 300 MG CR tablet Take 300 mg by mouth 3 (three) times daily.     [provider]  losartan (COZAAR) 50 MG tablet Take 50 mg by mouth daily.    [provider]  metFORMIN (GLUCOPHAGE) 500 MG tablet Take 500 mg by mouth 2 (two) times daily with a meal.    [provider]  [place] by Does not apply route.    [provider]  mirabegron ER (MYRBETRIQ) 25 MG TB24 tablet Take 1 tablet (25 mg total) by mouth daily. 01/14/18   McGowan, Larene [place] A, PA-C  MITIGARE 0.6 MG CAPS Take 1 capsule by mouth daily.  01/07/16   [provider]  omeprazole (PRILOSEC) 40 MG capsule Take 40 mg by mouth daily.  01/18/16   [provider]  rOPINIRole (REQUIP) 0.5 MG tablet TAKE THREE TABLETS NIGHTLY 01/22/15   [provider]  traZODone (DESYREL) 50 MG tablet TAKE 1 TO 2 TABLETS BY MOUTH EVERY NIGHT AT BEDTIME AS NEEDED FOR SLEEP 12/16/14   [provider]    Allergies Carbidopa w-levodopa; Latex; and Vesicare [solifenacin]    Social History Social History   Tobacco Use  . Smoking status: Former Smoker    Last attempt to quit: 10/24/1999    Years since quitting: 18.2  . Smokeless tobacco: Never Used  Substance Use Topics  . Alcohol use: No    Alcohol/week: 0.0 standard drinks  . Drug use: No    Review of Systems Patient denies headaches, rhinorrhea, blurry vision, numbness, shortness of breath, chest pain, edema, cough, abdominal pain, nausea, vomiting, diarrhea, dysuria, fevers, rashes or hallucinations unless otherwise stated above in HPI. ____________________________________________   PHYSICAL EXAM:  VITAL SIGNS: Vitals:   01/14/18 2130 01/14/18 2226  BP: (!) 146/55 (!) 132/53  Pulse: 97 96  Resp: 18 18  Temp:  99 F (37.2 C)  SpO2: 93% 97%    Constitutional: Alert and oriented.  Does not and in  no acute distress. Eyes: Conjunctivae are normal.  Head: Atraumatic. Nose: No congestion/rhinnorhea. Mouth/Throat: Mucous membranes are moist.   Neck: No stridor. Painless ROM.  Cardiovascular: Normal rate, regular rhythm. Grossly normal heart sounds.  Good peripheral circulation. Respiratory: Normal respiratory effort.  No retractions. Lungs CTAB. Gastrointestinal: Soft and nontender. No distention. No abdominal bruits. No CVA tenderness. Genitourinary: deferred Musculoskeletal: No lower extremity tenderness nor edema.  No joint effusions. Neurologic:  Normal speech and language. No gross focal neurologic deficits are appreciated. No facial droop Skin:  Skin is warm, dry and intact. No rash noted. Psychiatric: Mood and affect are normal. Speech and behavior are normal.  ____________________________________________   LABS (all labs ordered are listed, but only abnormal  results are displayed)  Results for orders placed or performed during the hospital encounter of 01/14/18 (from the past 24 hour(s))  Glucose, capillary     Status: Abnormal   Collection Time: 01/14/18  7:27 PM  Result Value Ref Range   Glucose-Capillary 181 (H) 70 - 99 mg/dL  Lactic acid, plasma     Status: None   Collection Time: 01/14/18  7:31 PM  Result Value Ref Range   Lactic Acid, Venous 1.9 0.5 - 1.9 mmol/L  Comprehensive metabolic panel     Status: Abnormal   Collection Time: 01/14/18  7:31 PM  Result Value Ref Range   Sodium 136 135 - 145 mmol/L   Potassium 4.5 3.5 - 5.1 mmol/L   Chloride 105 98 - 111 mmol/L   CO2 26 22 - 32 mmol/L   Glucose, Bld 196 (H) 70 - 99 mg/dL   BUN 18 8 - 23 mg/dL   Creatinine, Ser 1.14 0.61 - 1.24 mg/dL   Calcium 9.3 8.9 - 10.3 mg/dL   Total Protein 6.8 6.5 - 8.1 g/dL   Albumin 4.2 3.5 - 5.0 g/dL   AST 13 (L) 15 - 41 U/L   ALT 17 0 - 44 U/L   Alkaline Phosphatase 89 38 - 126 U/L   Total Bilirubin 0.9 0.3 - 1.2 mg/dL   GFR calc non Af Amer 59 (L) >60 mL/min   GFR calc Af  Amer >60 >60 mL/min   Anion gap 5 5 - 15  Troponin I     Status: None   Collection Time: 01/14/18  7:31 PM  Result Value Ref Range   Troponin I <0.03 <0.03 ng/mL  CBC WITH DIFFERENTIAL     Status: Abnormal   Collection Time: 01/14/18  7:31 PM  Result Value Ref Range   WBC 13.0 (H) 3.8 - 10.6 K/uL   RBC 3.42 (L) 4.40 - 5.90 MIL/uL   Hemoglobin 11.7 (L) 13.0 - 18.0 g/dL   HCT 33.8 (L) 40.0 - 52.0 %   MCV 99.0 80.0 - 100.0 fL   MCH 34.3 (H) 26.0 - 34.0 pg   MCHC 34.7 32.0 - 36.0 g/dL   RDW 13.1 11.5 - 14.5 %   Platelets 128 (L) 150 - 440 K/uL   Neutrophils Relative % 93 %   Neutro Abs 12.1 (H) 1.4 - 6.5 K/uL   Lymphocytes Relative 2 %   Lymphs Abs 0.3 (L) 1.0 - 3.6 K/uL   Monocytes Relative 4 %   Monocytes Absolute 0.5 0.2 - 1.0 K/uL   Eosinophils Relative 1 %   Eosinophils Absolute 0.1 0 - 0.7 K/uL   Basophils Relative 0 %   Basophils Absolute 0.0 0 - 0.1 K/uL  Urinalysis, Complete w Microscopic     Status: Abnormal   Collection Time: 01/14/18  8:06 PM  Result Value Ref Range   Color, Urine YELLOW (A) YELLOW   APPearance CLEAR (A) CLEAR   Specific Gravity, Urine 1.010 1.005 - 1.030   pH 7.0 5.0 - 8.0   Glucose, UA NEGATIVE NEGATIVE mg/dL   Hgb urine dipstick SMALL (A) NEGATIVE   Bilirubin Urine NEGATIVE NEGATIVE   Ketones, ur NEGATIVE NEGATIVE mg/dL   Protein, ur NEGATIVE NEGATIVE mg/dL   Nitrite NEGATIVE NEGATIVE   Leukocytes, UA NEGATIVE NEGATIVE   RBC / HPF 6-10 0 - 5 RBC/hpf   WBC, UA 0-5 0 - 5 WBC/hpf   Bacteria, UA NONE SEEN NONE SEEN   Squamous Epithelial / LPF 0-5 0 - 5  Influenza panel by PCR (type A & B)     Status: None   Collection Time: 01/14/18  8:06 PM  Result Value Ref Range   Influenza A By PCR NEGATIVE NEGATIVE   Influenza B By PCR NEGATIVE NEGATIVE   ____________________________________________  ____________________________________________  RADIOLOGY  .I personally reviewed all radiographic images ordered to evaluate for the above acute  complaints and reviewed radiology reports and findings.  These findings were personally discussed with the patient.  Please see medical record for radiology report.  ____________________________________________   PROCEDURES  Procedure(s) performed:  Procedures    Critical Care performed: no ____________________________________________   INITIAL IMPRESSION / ASSESSMENT AND PLAN / ED COURSE  Pertinent labs & imaging results that were available during my care of the patient were reviewed by me and considered in my medical decision making (see chart for details).   DDX: Dehydration, sepsis, pna, uti, hypoglycemia, cva, drug effect, withdrawal,    Craig Wilson is a 77 y.o. who presents to the ED with sense of as described above.  Patient is febrile but nontoxic-appearing.  Blood work will be sent for the above differential.  Will check flu.  No signs of pneumonia no hypoxia.  Abdominal exam is soft benign.  Not clinically consistent with meningitis but will reassess.  Clinical Course as of Jan 14 2246  Wed Jan 14, 2018  2108 Patient reassessed.  Flu negative.  Blood work is thus far fairly reassuring with mild leukocytosis but no lactic acidosis.  No hypoxia or consolidation to suggest pneumonia.  He is moving his head about does not seem clinically consistent with meningitis.  CT imaging does show sinusitis wife states that she is been having congestion and recent felt febrile illness.  Still awaiting urinalysis.  Will reassess.   [PR]  2222 Patient reassessed.   [PR]    Clinical Course User Index [PR] Merlyn Lot, MD     As part of my medical decision making, I reviewed the following data within the [place] notes reviewed and incorporated, Labs reviewed, notes from prior ED visits.  ____________________________________________   FINAL CLINICAL IMPRESSION(S) / ED DIAGNOSES  Final diagnoses:  Influenza-like illness  Febrile illness       NEW MEDICATIONS STARTED DURING THIS VISIT:  Discharge Medication List as of 01/14/2018 10:24 PM    START taking these medications   Details  doxycycline (VIBRA-TABS) 100 MG tablet Take 1 tablet (100 mg total) by mouth 2 (two) times daily for 7 days., Starting Wed 01/14/2018, Until Wed 01/21/2018, Normal         Note:  This document was prepared using Dragon voice recognition software and may include unintentional dictation errors.    Merlyn Lot, MD 01/14/18 [phone]

## 2018-01-14 NOTE — Telephone Encounter (Signed)
-----   Message from Nori Riis, PA-C sent at 01/13/2018  7:50 AM EDT ----- Please let Mr. Rog know that his PSA was undetectable.

## 2018-01-14 NOTE — ED Notes (Signed)
Pt given urinal for urine sample 

## 2018-01-14 NOTE — ED Notes (Signed)
Patient transported to CT 

## 2018-01-16 LAB — URINE CULTURE: CULTURE: NO GROWTH

## 2018-01-19 LAB — CULTURE, BLOOD (ROUTINE X 2)
CULTURE: NO GROWTH
Culture: NO GROWTH
Special Requests: ADEQUATE

## 2018-02-10 ENCOUNTER — Ambulatory Visit
Admission: RE | Admit: 2018-02-10 | Discharge: 2018-02-10 | Disposition: A | Discharge status: Home / Self Care | Payer: Medicare Other | Source: Ambulatory Visit | Attending: Urology | Admitting: Urology

## 2018-02-10 DIAGNOSIS — I728 Aneurysm of other specified arteries: Secondary | ICD-10-CM | POA: Diagnosis not present

## 2018-02-10 DIAGNOSIS — I700 Atherosclerosis of aorta: Secondary | ICD-10-CM | POA: Diagnosis not present

## 2018-02-10 DIAGNOSIS — Z87448 Personal history of other diseases of urinary system: Secondary | ICD-10-CM

## 2018-02-10 DIAGNOSIS — Z9079 Acquired absence of other genital organ(s): Secondary | ICD-10-CM | POA: Diagnosis not present

## 2018-02-10 MED ORDER — IOPAMIDOL (ISOVUE-300) INJECTION 61%
125.0000 mL | Freq: Once | INTRAVENOUS | Status: AC | PRN
  Administered 2018-02-10: 125 mL via INTRAVENOUS

## 2018-02-12 ENCOUNTER — Telehealth: Payer: Self-pay | Admitting: Urology

## 2018-02-12 ENCOUNTER — Other Ambulatory Visit: Payer: Medicare Other | Admitting: Urology

## 2018-02-12 NOTE — Telephone Encounter (Signed)
Just F.Y.I. Pt was a no show for cysto, but DID go for CT

## 2018-03-06 ENCOUNTER — Other Ambulatory Visit: Payer: Self-pay

## 2018-03-06 ENCOUNTER — Encounter: Payer: Self-pay | Admitting: Emergency Medicine

## 2018-03-06 ENCOUNTER — Emergency Department: Payer: Medicare Other

## 2018-03-06 ENCOUNTER — Emergency Department
Admission: EM | Admit: 2018-03-06 | Discharge: 2018-03-06 | Disposition: A | Discharge status: Home / Self Care | Payer: Medicare Other | Attending: Emergency Medicine | Admitting: Emergency Medicine

## 2018-03-06 DIAGNOSIS — E119 Type 2 diabetes mellitus without complications: Secondary | ICD-10-CM | POA: Insufficient documentation

## 2018-03-06 DIAGNOSIS — Z87891 Personal history of nicotine dependence: Secondary | ICD-10-CM | POA: Diagnosis not present

## 2018-03-06 DIAGNOSIS — R0981 Nasal congestion: Secondary | ICD-10-CM | POA: Insufficient documentation

## 2018-03-06 DIAGNOSIS — Z91040 Latex allergy status: Secondary | ICD-10-CM | POA: Insufficient documentation

## 2018-03-06 DIAGNOSIS — G20 Parkinson's disease: Secondary | ICD-10-CM | POA: Insufficient documentation

## 2018-03-06 DIAGNOSIS — E1140 Type 2 diabetes mellitus with diabetic neuropathy, unspecified: Secondary | ICD-10-CM | POA: Insufficient documentation

## 2018-03-06 DIAGNOSIS — R05 Cough: Secondary | ICD-10-CM

## 2018-03-06 DIAGNOSIS — R059 Cough, unspecified: Secondary | ICD-10-CM

## 2018-03-06 DIAGNOSIS — Z8546 Personal history of malignant neoplasm of prostate: Secondary | ICD-10-CM | POA: Insufficient documentation

## 2018-03-06 DIAGNOSIS — I10 Essential (primary) hypertension: Secondary | ICD-10-CM | POA: Insufficient documentation

## 2018-03-06 LAB — TROPONIN I

## 2018-03-06 LAB — CBC
HCT: 30.6 % — ABNORMAL LOW (ref 40.0–52.0)
HEMOGLOBIN: 10.5 g/dL — AB (ref 13.0–18.0)
MCH: 33.4 pg (ref 26.0–34.0)
MCHC: 34.2 g/dL (ref 32.0–36.0)
MCV: 97.8 fL (ref 80.0–100.0)
PLATELETS: 115 10*3/uL — AB (ref 150–440)
RBC: 3.13 MIL/uL — AB (ref 4.40–5.90)
RDW: 13.1 % (ref 11.5–14.5)
WBC: 8.8 10*3/uL (ref 3.8–10.6)

## 2018-03-06 LAB — BASIC METABOLIC PANEL
ANION GAP: 6 (ref 5–15)
BUN: 20 mg/dL (ref 8–23)
CALCIUM: 9 mg/dL (ref 8.9–10.3)
CO2: 25 mmol/L (ref 22–32)
CREATININE: 1.09 mg/dL (ref 0.61–1.24)
Chloride: 108 mmol/L (ref 98–111)
Glucose, Bld: 245 mg/dL — ABNORMAL HIGH (ref 70–99)
Potassium: 4.3 mmol/L (ref 3.5–5.1)
SODIUM: 139 mmol/L (ref 135–145)

## 2018-03-06 MED ORDER — HYDROCOD POLST-CPM POLST ER 10-8 MG/5ML PO SUER: 5.0000 mL | Freq: Two times a day (BID) | ORAL | 0 refills | Status: DC

## 2018-03-06 MED ORDER — LEVOFLOXACIN 750 MG PO TABS: 750.0000 mg | ORAL_TABLET | Freq: Every day | ORAL | 0 refills | Status: DC

## 2018-03-06 MED ORDER — LEVOFLOXACIN 750 MG PO TABS
750.0000 mg | ORAL_TABLET | Freq: Once | ORAL | Status: AC
  Administered 2018-03-06: 750 mg via ORAL
  Filled 2018-03-06: qty 1

## 2018-03-06 MED ORDER — HYDROCOD POLST-CPM POLST ER 10-8 MG/5ML PO SUER
ORAL | Status: AC
  Administered 2018-03-06: 5 mL via ORAL
  Filled 2018-03-06: qty 5

## 2018-03-06 MED ORDER — HYDROCOD POLST-CPM POLST ER 10-8 MG/5ML PO SUER
5.0000 mL | Freq: Once | ORAL | Status: AC
  Administered 2018-03-06: 5 mL via ORAL

## 2018-03-06 NOTE — ED Triage Notes (Addendum)
Pt arrived via POV with reports of fever of 102 at home last night, productive cough with green sputum, dizziness with standing, sore throat, hoarse voice, wheezing and nasal congestion.  Pt states he had 16 teeth extracted over the past several weeks last done on Tuesday. Pt currently taking Amoxicillin currently. Pt has been taking ibuprofen and tylenol.   Last dose of Tylenol 500mg  was around 0530. Pt has not taken Advil.

## 2018-03-06 NOTE — ED Provider Notes (Signed)
[facility] Emergency Department Provider Note       Time seen: ----------------------------------------- 8:24 PM on 03/06/2018 -----------------------------------------   I have reviewed the triage vital signs and the nursing notes.  HISTORY   Chief Complaint Nasal Congestion; Cough; Fever; and Dizziness    HPI Craig Wilson is a 77 y.o. male with a history of anemia, bipolar disorder, diabetes, depression, degenerative joint disease, hyperlipidemia, hypertension who presents to the ED for multiple complaints.  Patient reports a fever up to 102 last night with productive cough of green sputum.  He is also had some dizziness with standing, sore throat, wheezing and nasal congestion.  He had 16 teeth extracted on Tuesday and currently is taking amoxicillin.  He denies any other complaints at this time.  Past Medical History:  Diagnosis Date  . Anemia   . Bipolar disorder ([place])   . Chronic diarrhea   . Colon polyps   . Depression   . Dermatophytosis   . Diabetic neuropathy ([place])   . DJD (degenerative joint disease)   . Dyspnea   . Gout   . Gross hematuria   . HLD (hyperlipidemia)   . HTN (hypertension)   . Hyperkeratosis   . Onychomycosis   . Parkinson disease ([place])   . Prostate cancer ([place])   . Sleep apnea   . Tremor   . Type 2 diabetes mellitus [facility])     Patient Active Problem List   Diagnosis Date Noted  . Elevated coronary artery calcium score 03/04/2017  . Sepsis ([place]) 07/01/2016  . CAP (community acquired pneumonia) 07/01/2016  . Influenza A 07/01/2016  . 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 Mass of left side of neck 10/19/2015  . Thrombocytopenia ([place]) 09/07/2015  . Bilateral edema of lower extremity 05/10/2015  . Bipolar disorder, in partial remission, most recent episode depressed ([place]) 05/10/2015  . RLS (restless legs syndrome) 03/29/2015  . Adenomatous polyp 02/24/2015  . Anemia 02/24/2015  . Affective bipolar disorder ([place])  02/24/2015  . Chronic diarrhea 02/24/2015  . Diabetic neuropathy ([place]) 02/24/2015  . Arthritis, degenerative 02/24/2015  . 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 BP (high blood pressure) 02/24/2015  . CA of prostate ([place]) 02/24/2015  . Incontinence 02/01/2015  . History of prostate cancer 02/01/2015  . Gross hematuria 02/01/2015  . SOB (shortness of breath) 12/16/2014  . Pain in the chest 12/16/2014  . Family history of premature CAD 12/16/2014  . Parkinson's disease ([place]) 12/16/2014  . Essential hypertension 12/16/2014  . Unsteady gait 12/16/2014  . Hyperlipidemia 12/16/2014  . 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 Difficulty in walking 10/10/2014  . Difficulty sleeping 10/10/2014  . Breath shortness 05/06/2014  . Amnesia 02/28/2014  . Diabetes mellitus, type 2 ([place]) 02/15/2014  . Has a tremor 02/01/2006    Past Surgical History:  Procedure Laterality Date  . cataract surgery Bilateral   . COLONOSCOPY W/ BIOPSIES    . FOOT SURGERY    . PROSTATE SURGERY     removed  . status post implantation of artificial urinary sphincter     . TONSILLECTOMY      Allergies Carbidopa w-levodopa; Carbidopa-levodopa; Latex; and Solifenacin  Social History Social History   Tobacco Use  . Smoking status: Former Smoker    Last attempt to quit: 10/24/1999    Years since quitting: 18.3  . Smokeless tobacco: Never Used  Substance Use Topics  . Alcohol use: No    Alcohol/week: 0.0 standard drinks  . Drug use: No  Review of Systems Constitutional: Positive for fever ENT: Positive for toothache Cardiovascular: Negative for chest pain. Respiratory: Negative for shortness of breath.  Positive for cough with sputum production Gastrointestinal: Negative for abdominal pain, vomiting and diarrhea. Musculoskeletal: Negative for back pain. Skin: Negative for rash. Neurological: Negative for headaches, focal weakness or numbness.  All systems negative/normal/unremarkable except as  stated in the HPI  ____________________________________________   PHYSICAL EXAM:  VITAL SIGNS: ED Triage Vitals  Enc Vitals Group     BP 03/06/18 1823 (!) 160/58     Pulse Rate 03/06/18 1823 84     Resp --      Temp 03/06/18 1823 98.6 F (37 C)     Temp Source 03/06/18 1823 Oral     SpO2 03/06/18 1823 97 %     Weight 03/06/18 1824 180 lb (81.6 kg)     Height 03/06/18 1824 5\' 9"  (1.753 m)     Head Circumference --      Peak Flow --      Pain Score 03/06/18 1836 0     Pain Loc --      Pain Edu? --      Excl. in [place]? --    Constitutional: Alert and oriented. Well appearing and in no distress. Eyes: Conjunctivae are normal. Normal extraocular movements. ENT   Head: Normocephalic and atraumatic.   Nose: No congestion/rhinnorhea.   Mouth/Throat: Mucous membranes are moist.  Previous dental extraction sites do not appear to have any significant drainage, ecchymosis or erythema.  Other dental caries are noted   Neck: No stridor. Cardiovascular: Normal rate, regular rhythm. No murmurs, rubs, or gallops. Respiratory: Normal respiratory effort without tachypnea nor retractions. Breath sounds are clear and equal bilaterally. No wheezes/rales/rhonchi. Gastrointestinal: Soft and nontender. Normal bowel sounds Musculoskeletal: Nontender with normal range of motion in extremities. No lower extremity tenderness nor edema. Neurologic:  Normal speech and language. No gross focal neurologic deficits are appreciated.  Skin:  Skin is warm, dry and intact. No rash noted. Psychiatric: Mood and affect are normal. Speech and behavior are normal.  ____________________________________________  EKG: Interpreted by me.  Sinus rhythm the rate of 76 bpm, right bundle branch block, normal axis, normal QT  ____________________________________________  ED COURSE:  As part of my medical decision making, I reviewed the following data within the [place] History obtained from  family if available, nursing notes, old chart and ekg, as well as notes from prior ED visits. Patient presented for multiple complaints, we will assess with labs and imaging as indicated at this time.   Procedures ____________________________________________   LABS (pertinent positives/negatives)  Labs Reviewed  BASIC METABOLIC PANEL - Abnormal; Notable for the following components:      Result Value   Glucose, Bld 245 (*)    All other components within normal limits  CBC - Abnormal; Notable for the following components:   RBC 3.13 (*)    Hemoglobin 10.5 (*)    HCT 30.6 (*)    Platelets 115 (*)    All other components within normal limits  TROPONIN I    RADIOLOGY Images were viewed by me  Chest x-ray does not reveal any acute process, left lower lobe atelectasis  ____________________________________________  DIFFERENTIAL DIAGNOSIS   URI, pneumonia, allergy, dental abscess  FINAL ASSESSMENT AND PLAN  Cough   Plan: The patient had presented for multiple complaints. Patient's labs mostly unremarkable or at his baseline. Patient's imaging was negative with the exception of left lower  lobe atelectasis or scarring.  When to start him on Levaquin and stop the amoxicillin as well as give Tussionex.  He is cleared for outpatient follow-up.   Craig Aly, MD   Note: This note was generated in part or whole with voice recognition software. Voice recognition is usually quite accurate but there are transcription errors that can and very often do occur. I apologize for any typographical errors that were not detected and corrected.     Earleen Newport, MD 03/06/18 2027

## 2018-03-06 NOTE — ED Notes (Signed)
MD at bedside. 

## 2018-03-09 ENCOUNTER — Telehealth: Payer: Self-pay | Admitting: Urology

## 2018-03-09 NOTE — Telephone Encounter (Signed)
Its fine to cysto him in the office if he is feeling well enough to come in.  Craig Espy, MD

## 2018-03-09 NOTE — Telephone Encounter (Signed)
Patient notified and will come in.

## 2018-03-09 NOTE — Telephone Encounter (Signed)
Pt stopped by office today to let you know he had a chest x-ray that showed early signs of pneumonia in one lobe, 2 more days of levofloxacin, still having breathing problems, cough and dizziness and 16 teeth pulled last Tuesday.  He wanted to know if you wanted to still do cysto tomorrow or does he need to be rescheduled?

## 2018-03-10 ENCOUNTER — Encounter: Payer: Self-pay | Admitting: Urology

## 2018-03-10 ENCOUNTER — Ambulatory Visit (INDEPENDENT_AMBULATORY_CARE_PROVIDER_SITE_OTHER): Payer: Medicare Other | Admitting: Urology

## 2018-03-10 VITALS — BP 135/65 | HR 85 | Ht 69.0 in | Wt 180.0 lb

## 2018-03-10 DIAGNOSIS — Z87448 Personal history of other diseases of urinary system: Secondary | ICD-10-CM

## 2018-03-10 DIAGNOSIS — N393 Stress incontinence (female) (male): Secondary | ICD-10-CM

## 2018-03-10 LAB — URINALYSIS, COMPLETE
Bilirubin, UA: NEGATIVE
GLUCOSE, UA: NEGATIVE
KETONES UA: NEGATIVE
Leukocytes, UA: NEGATIVE
NITRITE UA: NEGATIVE
Protein, UA: NEGATIVE
SPEC GRAV UA: 1.015 (ref 1.005–1.030)
Urobilinogen, Ur: 0.2 mg/dL (ref 0.2–1.0)
pH, UA: 7 (ref 5.0–7.5)

## 2018-03-10 LAB — MICROSCOPIC EXAMINATION
Epithelial Cells (non renal): NONE SEEN /hpf (ref 0–10)
WBC, UA: NONE SEEN /hpf (ref 0–5)

## 2018-03-10 NOTE — Progress Notes (Signed)
   03/13/18  CC:  Chief Complaint  Patient presents with  . Cysto    HPI: 77 year old male with a personal history of prostate cancer and artificial urinary sphincter with a recent episode of gross hematuria.  He underwent further evaluation for this in 2016 which was unremarkable.  He has agreed to repeat work-up including cystoscopy for which he returns today.  Please see previous note for details.  CT urogram from 02/10/2017 was personally reviewed, unremarkable.  Blood pressure 135/65, pulse 85, height 5\' 9"  (1.753 m), weight 180 lb (81.6 kg). NED. A&Ox3.   No respiratory distress   Abd soft, NT, ND Normal phallus with bilateral descended testicles  Cystoscopy Procedure Note  Patient identification was confirmed, informed consent was obtained, and patient was prepped using Betadine solution.  Lidocaine jelly was administered per urethral meatus.     Pre-Procedure: - Inspection reveals a normal caliber ureteral meatus.  Procedure: The flexible cystoscope was introduced without difficulty - No urethral strictures/lesions are present though there was one fine wispy septum in the mid pendulous urethra which was obliterated with the scope without stricture -Prostate is surgically absent -Urethral sphincter present in appropriate position, cuff directly visualized and cycled several times which appears to be functioning Ensure that the cuff was deflated upon advancing the scope in the bladder - Normal bladder neck - Bilateral ureteral orifices identified - Bladder mucosa  reveals no ulcers, tumors, or lesions - No bladder stones - No trabeculation  Retroflexion unremarkable   Post-Procedure: - Patient tolerated the procedure well  Assessment/ Plan:  1. History of hematuria Cystoscopy today unremarkable Etiology for gross hematuria unclear, no evidence of pathology in bladder or on CT urogram which is reassuring - Urinalysis, Complete  2. Urinary incontinence, male,  stress AUS cycle today under direct visualization, appears to be working appropriately   Return in about 1 year (around 03/11/2019) for shannon.  Hollice Espy, MD

## 2018-03-15 ENCOUNTER — Emergency Department
Admission: EM | Admit: 2018-03-15 | Discharge: 2018-03-15 | Disposition: A | Discharge status: Home / Self Care | Payer: Medicare Other | Attending: Emergency Medicine | Admitting: Emergency Medicine

## 2018-03-15 ENCOUNTER — Other Ambulatory Visit: Payer: Self-pay

## 2018-03-15 DIAGNOSIS — Z7984 Long term (current) use of oral hypoglycemic drugs: Secondary | ICD-10-CM | POA: Diagnosis not present

## 2018-03-15 DIAGNOSIS — G20 Parkinson's disease: Secondary | ICD-10-CM | POA: Insufficient documentation

## 2018-03-15 DIAGNOSIS — Z87891 Personal history of nicotine dependence: Secondary | ICD-10-CM | POA: Insufficient documentation

## 2018-03-15 DIAGNOSIS — Z7982 Long term (current) use of aspirin: Secondary | ICD-10-CM | POA: Diagnosis not present

## 2018-03-15 DIAGNOSIS — I10 Essential (primary) hypertension: Secondary | ICD-10-CM | POA: Insufficient documentation

## 2018-03-15 DIAGNOSIS — R319 Hematuria, unspecified: Secondary | ICD-10-CM | POA: Diagnosis not present

## 2018-03-15 DIAGNOSIS — Z91040 Latex allergy status: Secondary | ICD-10-CM | POA: Insufficient documentation

## 2018-03-15 DIAGNOSIS — E1140 Type 2 diabetes mellitus with diabetic neuropathy, unspecified: Secondary | ICD-10-CM | POA: Diagnosis not present

## 2018-03-15 DIAGNOSIS — Z79899 Other long term (current) drug therapy: Secondary | ICD-10-CM | POA: Diagnosis not present

## 2018-03-15 DIAGNOSIS — R339 Retention of urine, unspecified: Secondary | ICD-10-CM | POA: Diagnosis present

## 2018-03-15 DIAGNOSIS — Z8546 Personal history of malignant neoplasm of prostate: Secondary | ICD-10-CM | POA: Insufficient documentation

## 2018-03-15 LAB — CBC WITH DIFFERENTIAL/PLATELET
ABS IMMATURE GRANULOCYTES: 0.09 10*3/uL — AB (ref 0.00–0.07)
BASOS ABS: 0 10*3/uL (ref 0.0–0.1)
BASOS PCT: 0 %
EOS ABS: 0.2 10*3/uL (ref 0.0–0.5)
Eosinophils Relative: 1 %
HEMATOCRIT: 34.7 % — AB (ref 39.0–52.0)
HEMOGLOBIN: 11.8 g/dL — AB (ref 13.0–17.0)
IMMATURE GRANULOCYTES: 1 %
Lymphocytes Relative: 7 %
Lymphs Abs: 0.8 10*3/uL (ref 0.7–4.0)
MCH: 32.9 pg (ref 26.0–34.0)
MCHC: 34 g/dL (ref 30.0–36.0)
MCV: 96.7 fL (ref 80.0–100.0)
Monocytes Absolute: 0.6 10*3/uL (ref 0.1–1.0)
Monocytes Relative: 5 %
NEUTROS PCT: 86 %
NRBC: 0 % (ref 0.0–0.2)
Neutro Abs: 9.9 10*3/uL — ABNORMAL HIGH (ref 1.7–7.7)
Platelets: 174 10*3/uL (ref 150–400)
RBC: 3.59 MIL/uL — AB (ref 4.22–5.81)
RDW: 12.3 % (ref 11.5–15.5)
WBC: 11.6 10*3/uL — AB (ref 4.0–10.5)

## 2018-03-15 LAB — BASIC METABOLIC PANEL
ANION GAP: 9 (ref 5–15)
BUN: 18 mg/dL (ref 8–23)
CALCIUM: 9.7 mg/dL (ref 8.9–10.3)
CO2: 27 mmol/L (ref 22–32)
Chloride: 98 mmol/L (ref 98–111)
Creatinine, Ser: 1.02 mg/dL (ref 0.61–1.24)
Glucose, Bld: 141 mg/dL — ABNORMAL HIGH (ref 70–99)
POTASSIUM: 4.8 mmol/L (ref 3.5–5.1)
Sodium: 134 mmol/L — ABNORMAL LOW (ref 135–145)

## 2018-03-15 LAB — URINALYSIS, COMPLETE (UACMP) WITH MICROSCOPIC
BILIRUBIN URINE: NEGATIVE
Glucose, UA: 50 mg/dL — AB
KETONES UR: NEGATIVE mg/dL
LEUKOCYTES UA: NEGATIVE
NITRITE: NEGATIVE
Protein, ur: NEGATIVE mg/dL
SPECIFIC GRAVITY, URINE: 1.003 — AB (ref 1.005–1.030)
Squamous Epithelial / LPF: NONE SEEN (ref 0–5)
pH: 7 (ref 5.0–8.0)

## 2018-03-15 MED ORDER — LIDOCAINE HCL URETHRAL/MUCOSAL 2 % EX GEL
1.0000 "application " | Freq: Once | CUTANEOUS | Status: AC
  Administered 2018-03-15: 1 via URETHRAL

## 2018-03-15 MED ORDER — LIDOCAINE HCL URETHRAL/MUCOSAL 2 % EX GEL
CUTANEOUS | Status: AC
  Filled 2018-03-15: qty 10

## 2018-03-15 NOTE — ED Notes (Signed)
Pt foley bag was replaced with leg bag. At the time pt had 1581mL emptied  from foley by this Probation officer. Pt is now resting in bed with no distress.

## 2018-03-15 NOTE — ED Notes (Signed)
Pt called stating that he had to urinate. Bladder scan >461. Pt up to toilet to try and urinate.

## 2018-03-15 NOTE — ED Triage Notes (Signed)
Pt states he had a cystostomy on Thursday and has had to strain to urinate since but states he has not been able to pass any urine in the past 11hrs.

## 2018-03-15 NOTE — ED Provider Notes (Addendum)
[facility] Emergency Department Provider Note  ____________________________________________   I have reviewed the triage vital signs and the nursing notes. Where available I have reviewed prior notes and, if possible and indicated, outside hospital notes.    HISTORY  Chief Complaint Urinary Retention    HPI Craig Wilson is a 77 y.o. male  with an AUS x 10 years who late last week had a cysto. Has had increased hesitancy since procedure and has now not urinated for the last  11 hours and has feelings of fullness. No fever no dysuria. Did have some hematuria.    Past Medical History:  Diagnosis Date  . Anemia   . Bipolar disorder ([place])   . Chronic diarrhea   . Colon polyps   . Depression   . Dermatophytosis   . Diabetic neuropathy ([place])   . DJD (degenerative joint disease)   . Dyspnea   . Gout   . Gross hematuria   . HLD (hyperlipidemia)   . HTN (hypertension)   . Hyperkeratosis   . Onychomycosis   . Parkinson disease ([place])   . Prostate cancer ([place])   . Sleep apnea   . Tremor   . Type 2 diabetes mellitus [facility])     Patient Active Problem List   Diagnosis Date Noted  . Elevated coronary artery calcium score 03/04/2017  . Sepsis ([place]) 07/01/2016  . CAP (community acquired pneumonia) 07/01/2016  . Influenza A 07/01/2016  . 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 Mass of left side of neck 10/19/2015  . Thrombocytopenia ([place]) 09/07/2015  . Bilateral edema of lower extremity 05/10/2015  . Bipolar disorder, in partial remission, most recent episode depressed ([place]) 05/10/2015  . RLS (restless legs syndrome) 03/29/2015  . Adenomatous polyp 02/24/2015  . Anemia 02/24/2015  . Affective bipolar disorder ([place]) 02/24/2015  . Chronic diarrhea 02/24/2015  . Diabetic neuropathy ([place]) 02/24/2015  . Arthritis, degenerative 02/24/2015  . 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 BP (high blood pressure) 02/24/2015  . CA of prostate ([place]) 02/24/2015  . Incontinence  02/01/2015  . History of prostate cancer 02/01/2015  . Gross hematuria 02/01/2015  . SOB (shortness of breath) 12/16/2014  . Pain in the chest 12/16/2014  . Family history of premature CAD 12/16/2014  . Parkinson's disease ([place]) 12/16/2014  . Essential hypertension 12/16/2014  . Unsteady gait 12/16/2014  . Hyperlipidemia 12/16/2014  . 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 Difficulty in walking 10/10/2014  . Difficulty sleeping 10/10/2014  . Breath shortness 05/06/2014  . Amnesia 02/28/2014  . Diabetes mellitus, type 2 ([place]) 02/15/2014  . Has a tremor 02/01/2006    Past Surgical History:  Procedure Laterality Date  . cataract surgery Bilateral   . COLONOSCOPY W/ BIOPSIES    . FOOT SURGERY    . PROSTATE SURGERY     removed  . status post implantation of artificial urinary sphincter     . TONSILLECTOMY      Prior to Admission medications   Medication Sig Start Date End Date Taking? Authorizing Provider  aspirin 81 MG tablet Take 81 mg by mouth daily.    [provider]  atorvastatin (LIPITOR) 40 MG tablet Take 40 mg by mouth daily.    [provider]  chlorpheniramine-HYDROcodone (TUSSIONEX PENNKINETIC ER) 10-8 MG/5ML SUER Take 5 mLs by mouth 2 (two) times daily. 03/06/18   Earleen Newport, MD  glucose blood test strip 1 each by Other route as needed for other. Use as instructed    [provider]  levofloxacin (LEVAQUIN) 750 MG tablet Take 1 tablet (750 mg total) by mouth daily. 03/06/18   Earleen Newport, MD  lithium carbonate (LITHOBID) 300 MG CR tablet Take 300 mg by mouth 3 (three) times daily.     [provider]  losartan (COZAAR) 50 MG tablet Take 50 mg by mouth daily.    [provider]  metFORMIN (GLUCOPHAGE) 500 MG tablet Take 500 mg by mouth 2 (two) times daily with a meal.    [provider]  [place] by Does not apply route.    [provider]  mirabegron ER (MYRBETRIQ)  25 MG TB24 tablet Take 1 tablet (25 mg total) by mouth daily. 01/14/18   McGowan, Larene Beach A, PA-C  MITIGARE 0.6 MG CAPS Take 1 capsule by mouth daily.  01/07/16   [provider]  omeprazole (PRILOSEC) 40 MG capsule Take 40 mg by mouth daily.  01/18/16   [provider]  rOPINIRole (REQUIP) 0.5 MG tablet TAKE THREE TABLETS NIGHTLY 01/22/15   [provider]  traZODone (DESYREL) 50 MG tablet TAKE 1 TO 2 TABLETS BY MOUTH EVERY NIGHT AT BEDTIME AS NEEDED FOR SLEEP 12/16/14   [provider]    Allergies Carbidopa w-levodopa; Carbidopa-levodopa; Latex; and Solifenacin  Family History  Problem Relation Age of Onset  . Heart disease Mother   . Heart attack Father 10  . Hypertension Father   . Hyperlipidemia Father   . Kidney cancer Neg Hx   . Prostate cancer Neg Hx   . Bladder Cancer Neg Hx     Social History Social History   Tobacco Use  . Smoking status: Former Smoker    Last attempt to quit: 10/24/1999    Years since quitting: 18.4  . Smokeless tobacco: Never Used  Substance Use Topics  . Alcohol use: No    Alcohol/week: 0.0 standard drinks  . Drug use: No    Review of Systems  Constitutional: No fever/chills Eyes: No visual changes. ENT: No sore throat. No stiff neck no neck pain Cardiovascular: Denies chest pain. Respiratory: Denies shortness of breath. Gastrointestinal:   no vomiting.  No diarrhea.  No constipation. Genitourinary: Negative for dysuria. Musculoskeletal: Negative lower extremity swelling Skin: Negative for rash. Neurological: Negative for severe headaches, focal weakness or numbness.   ____________________________________________   PHYSICAL EXAM:  VITAL SIGNS: ED Triage Vitals  Enc Vitals Group     BP 03/15/18 1434 (!) 214/77     Pulse Rate 03/15/18 1434 88     Resp 03/15/18 1434 17     Temp 03/15/18 1434 99.2 F (37.3 C)     Temp Source 03/15/18 1434 Oral     SpO2 03/15/18 1434 98 %     Weight 03/15/18 1436  180 lb (81.6 kg)     Height 03/15/18 1436 5\' 9"  (1.753 m)     Head Circumference --      Peak Flow --      Pain Score 03/15/18 1436 9     Pain Loc --      Pain Edu? --      Excl. in [place]? --     Constitutional: Alert and oriented. Well appearing and in no acute distress. Eyes: Conjunctivae are normal Head: Atraumatic HEENT: No congestion/rhinnorhea. Mucous membranes are moist.  Oropharynx non-erythematous Neck:   Nontender with no meningismus, no masses, no stridor Cardiovascular: Normal rate, regular rhythm. Grossly normal heart sounds.  Good peripheral circulation. Respiratory: Normal respiratory effort.  No retractions. Lungs CTAB. Abdominal: Soft and suprapubic tenderness. No distention. No guarding no rebound Back:  There is no focal tenderness or step off.  there is no midline tenderness there are no lesions noted. there is no CVA tenderness Musculoskeletal: No lower extremity tenderness, no upper extremity tenderness. No joint effusions, no DVT signs strong distal pulses no edema Neurologic:  Normal speech and language. No gross focal neurologic deficits are appreciated.  Skin:  Skin is warm, dry and intact. No rash noted. Psychiatric: Mood and affect are normal. Speech and behavior are normal.  ____________________________________________   LABS (all labs ordered are listed, but only abnormal results are displayed)  Labs Reviewed  URINE CULTURE  BASIC METABOLIC PANEL  CBC WITH DIFFERENTIAL/PLATELET  URINALYSIS, COMPLETE (UACMP) WITH MICROSCOPIC    Pertinent labs  results that were available during my care of the patient were reviewed by me and considered in my medical decision making (see chart for details). ____________________________________________  EKG  I personally interpreted any EKGs ordered by me or triage ____________________________________________  RADIOLOGY  Pertinent labs & imaging results that were available during my care of the patient were  reviewed by me and considered in my medical decision making (see chart for details). If possible, patient and/or family made aware of any abnormal findings.  No results found. ____________________________________________    PROCEDURES  Procedure(s) performed: None  Procedures  Critical Care performed: None  ____________________________________________   INITIAL IMPRESSION / ASSESSMENT AND PLAN / ED COURSE  Pertinent labs & imaging results that were available during my care of the patient were reviewed by me and considered in my medical decision making (see chart for details).  D/w Dr. Shaune Leeks of urology. Appreciate consult.  He recommends that I try to set the sphincter to "open" by depressing the bulb and then pushing the button, which I have done.  To the extent that I can.  This was done under direct guidance of urology on the phone.  Very much appreciate their guidance.  Unfortunately patient has not yet been able to urinate.  They then asked if he cannot urinate after this procedure that we try a 12 [place] Foley and just leave it in until he can see him in the office which we will try if he cannot urinate without that.  ----------------------------------------- 3:46 PM on 03/15/2018 -----------------------------------------  After our best efforts to open the sphincter, patient unable to void, we will try after urology advised a 12 [place] small catheter.  ----------------------------------------- 4:17 PM on 03/15/2018 ----------------------------------------- We were able to place a Foley catheter no difficulty, he has put out a sizable amount of urine and we will continue to do so.  We did clamp after 700 we will repeat ~strain.  We will send him home with a leg bag per urology plan.  Kidney function is preserved.  Urinalysis is pending  ----------------------------------------- 5:02 PM on 03/15/2018 -----------------------------------------  he was put out nearly 1500  cc of fluid no evidence of bladder rupture no evidence of UTI, Will discharge with close outpatient follow-up and return precautions given and understood.   ____________________________________________   FINAL CLINICAL IMPRESSION(S) / ED DIAGNOSES  Final diagnoses:  None      This chart was dictated using voice recognition software.  Despite best efforts to proofread,  errors can occur which can change meaning.   3    Schuyler Amor, MD 03/15/18 1535    Schuyler Amor, MD 03/15/18 1546    Schuyler Amor,  MD 03/15/18 1617    Schuyler Amor, MD 03/15/18 [phone]

## 2018-03-15 NOTE — Discharge Instructions (Signed)
If you have high fever, vomiting or increased pain return to the emergency room.  Keep the Foley in place until you see your doctor.  Call your urologist on Monday for appointment

## 2018-03-15 NOTE — ED Notes (Signed)
Bladder Scanned patient.  >63ml found

## 2018-03-16 ENCOUNTER — Other Ambulatory Visit: Payer: Self-pay

## 2018-03-16 ENCOUNTER — Telehealth: Payer: Self-pay

## 2018-03-16 ENCOUNTER — Encounter: Payer: Self-pay | Admitting: Emergency Medicine

## 2018-03-16 ENCOUNTER — Emergency Department
Admission: EM | Admit: 2018-03-16 | Discharge: 2018-03-16 | Disposition: A | Discharge status: Home / Self Care | Payer: Medicare Other | Attending: Student in an Organized Health Care Education/Training Program | Admitting: Student in an Organized Health Care Education/Training Program

## 2018-03-16 DIAGNOSIS — I10 Essential (primary) hypertension: Secondary | ICD-10-CM | POA: Insufficient documentation

## 2018-03-16 DIAGNOSIS — Z87891 Personal history of nicotine dependence: Secondary | ICD-10-CM | POA: Insufficient documentation

## 2018-03-16 DIAGNOSIS — Z91040 Latex allergy status: Secondary | ICD-10-CM | POA: Insufficient documentation

## 2018-03-16 DIAGNOSIS — G20 Parkinson's disease: Secondary | ICD-10-CM | POA: Diagnosis not present

## 2018-03-16 DIAGNOSIS — Z7982 Long term (current) use of aspirin: Secondary | ICD-10-CM | POA: Diagnosis not present

## 2018-03-16 DIAGNOSIS — Z79899 Other long term (current) drug therapy: Secondary | ICD-10-CM | POA: Diagnosis not present

## 2018-03-16 DIAGNOSIS — T839XXA Unspecified complication of genitourinary prosthetic device, implant and graft, initial encounter: Secondary | ICD-10-CM | POA: Insufficient documentation

## 2018-03-16 DIAGNOSIS — E1140 Type 2 diabetes mellitus with diabetic neuropathy, unspecified: Secondary | ICD-10-CM | POA: Diagnosis not present

## 2018-03-16 DIAGNOSIS — Z7984 Long term (current) use of oral hypoglycemic drugs: Secondary | ICD-10-CM | POA: Insufficient documentation

## 2018-03-16 DIAGNOSIS — Y731 Therapeutic (nonsurgical) and rehabilitative gastroenterology and urology devices associated with adverse incidents: Secondary | ICD-10-CM | POA: Diagnosis not present

## 2018-03-16 LAB — URINE CULTURE: CULTURE: NO GROWTH

## 2018-03-16 MED ORDER — OXYBUTYNIN CHLORIDE 5 MG PO TABS: 5.0000 mg | ORAL_TABLET | Freq: Three times a day (TID) | ORAL | 0 refills | Status: AC

## 2018-03-16 MED ORDER — OXYBUTYNIN CHLORIDE 5 MG PO TABS
5.0000 mg | ORAL_TABLET | Freq: Three times a day (TID) | ORAL | Status: DC
  Administered 2018-03-16: 5 mg via ORAL
  Filled 2018-03-16: qty 1

## 2018-03-16 NOTE — ED Triage Notes (Signed)
Patient was here last night and had a urinary catheter placed and says now he is having drainage at his penis and says he is having urine leaking out around the catheter. Patient states this started at noon today. Patient denies pain.

## 2018-03-16 NOTE — ED Provider Notes (Signed)
[facility] Emergency Department Provider Note    First MD Initiated Contact with Patient 03/16/18 1911     (approximate)  I have reviewed the triage vital signs and the nursing notes.   HISTORY  Chief Complaint Leaking foley catheter   HPI Craig Wilson is a 77 y.o. male presents the ER 24 hours after having Foley catheter inserted due to bladder outlet obstruction urinary retention.  Re-presents to the ER due to leaking around his Foley catheter.  Not noticed any blood.  Denies any pain.  No fevers.  Called his urologist but was unable to get in the clinic.  Past Medical History:  Diagnosis Date  . Anemia   . Bipolar disorder ([place])   . Chronic diarrhea   . Colon polyps   . Depression   . Dermatophytosis   . Diabetic neuropathy ([place])   . DJD (degenerative joint disease)   . Dyspnea   . Gout   . Gross hematuria   . HLD (hyperlipidemia)   . HTN (hypertension)   . Hyperkeratosis   . Onychomycosis   . Parkinson disease ([place])   . Prostate cancer ([place])   . Sleep apnea   . Tremor   . Type 2 diabetes mellitus (HCC)    Family History  Problem Relation Age of Onset  . Heart disease Mother   . Heart attack Father 71  . Hypertension Father   . Hyperlipidemia Father   . Kidney cancer Neg Hx   . Prostate cancer Neg Hx   . Bladder Cancer Neg Hx    Past Surgical History:  Procedure Laterality Date  . cataract surgery Bilateral   . COLONOSCOPY W/ BIOPSIES    . FOOT SURGERY    . PROSTATE SURGERY     removed  . status post implantation of artificial urinary sphincter     . TONSILLECTOMY     Patient Active Problem List   Diagnosis Date Noted  . Elevated coronary artery calcium score 03/04/2017  . Sepsis ([place]) 07/01/2016  . CAP (community acquired pneumonia) 07/01/2016  . Influenza A 07/01/2016  . 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 Mass of left side of neck 10/19/2015  . Thrombocytopenia ([place]) 09/07/2015  . Bilateral edema of lower extremity  05/10/2015  . Bipolar disorder, in partial remission, most recent episode depressed ([place]) 05/10/2015  . RLS (restless legs syndrome) 03/29/2015  . Adenomatous polyp 02/24/2015  . Anemia 02/24/2015  . Affective bipolar disorder ([place]) 02/24/2015  . Chronic diarrhea 02/24/2015  . Diabetic neuropathy ([place]) 02/24/2015  . Arthritis, degenerative 02/24/2015  . 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 BP (high blood pressure) 02/24/2015  . CA of prostate ([place]) 02/24/2015  . Incontinence 02/01/2015  . History of prostate cancer 02/01/2015  . Gross hematuria 02/01/2015  . SOB (shortness of breath) 12/16/2014  . Pain in the chest 12/16/2014  . Family history of premature CAD 12/16/2014  . Parkinson's disease ([place]) 12/16/2014  . Essential hypertension 12/16/2014  . Unsteady gait 12/16/2014  . Hyperlipidemia 12/16/2014  . 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 Difficulty in walking 10/10/2014  . Difficulty sleeping 10/10/2014  . Breath shortness 05/06/2014  . Amnesia 02/28/2014  . Diabetes mellitus, type 2 ([place]) 02/15/2014  . Has a tremor 02/01/2006      Prior to Admission medications   Medication Sig Start Date End Date Taking? Authorizing Provider  aspirin 81 MG tablet Take 81 mg by mouth daily.    [provider]  atorvastatin (LIPITOR) 40 MG  tablet Take 40 mg by mouth daily.    [provider]  chlorpheniramine-HYDROcodone (TUSSIONEX PENNKINETIC ER) 10-8 MG/5ML SUER Take 5 mLs by mouth 2 (two) times daily. 03/06/18   Earleen Newport, MD  glucose blood test strip 1 each by Other route as needed for other. Use as instructed    [provider]  levofloxacin (LEVAQUIN) 750 MG tablet Take 1 tablet (750 mg total) by mouth daily. 03/06/18   Earleen Newport, MD  lithium carbonate (LITHOBID) 300 MG CR tablet Take 300 mg by mouth 3 (three) times daily.     [provider]  losartan (COZAAR) 50 MG tablet Take 50 mg by mouth daily.    [provider]  metFORMIN (GLUCOPHAGE) 500 MG tablet Take 500 mg by mouth 2 (two) times daily with a meal.    [provider]  [place] by Does not apply route.    [provider]  mirabegron ER (MYRBETRIQ) 25 MG TB24 tablet Take 1 tablet (25 mg total) by mouth daily. 01/14/18   McGowan, Larene [place] A, PA-C  MITIGARE 0.6 MG CAPS Take 1 capsule by mouth daily.  01/07/16   [provider]  omeprazole (PRILOSEC) 40 MG capsule Take 40 mg by mouth daily.  01/18/16   [provider]  oxybutynin (DITROPAN) 5 MG tablet Take 1 tablet (5 mg total) by mouth 3 (three) times daily for 5 days. 03/16/18 03/21/18  Merlyn Lot, MD  rOPINIRole (REQUIP) 0.5 MG tablet TAKE THREE TABLETS NIGHTLY 01/22/15   [provider]  traZODone (DESYREL) 50 MG tablet TAKE 1 TO 2 TABLETS BY MOUTH EVERY NIGHT AT BEDTIME AS NEEDED FOR SLEEP 12/16/14   [provider]    Allergies Carbidopa w-levodopa; Carbidopa-levodopa; Latex; and Solifenacin    Social History Social History   Tobacco Use  . Smoking status: Former Smoker    Last attempt to quit: 10/24/1999    Years since quitting: 18.4  . Smokeless tobacco: Never Used  Substance Use Topics  . Alcohol use: No    Alcohol/week: 0.0 standard drinks  . Drug use: No    Review of Systems Patient denies headaches, rhinorrhea, blurry vision, numbness, shortness of breath, chest pain, edema, cough, abdominal pain, nausea, vomiting, diarrhea, dysuria, fevers, rashes or hallucinations unless otherwise stated above in HPI. ____________________________________________   PHYSICAL EXAM:  VITAL SIGNS: Vitals:   03/16/18 1906 03/16/18 2044  BP: (!) 175/59 (!) 161/62  Pulse: 91 82  Resp: 16 18  Temp: 98.6 F (37 C)   SpO2: 98% 99%    Constitutional: Alert and oriented. Well appearing and in no acute distress. Eyes: Conjunctivae are normal.  Head: Atraumatic. Nose: No congestion/rhinnorhea. Mouth/Throat:  Mucous membranes are moist.   Neck: Painless ROM.  Cardiovascular:   Good peripheral circulation. Respiratory: Normal respiratory effort.  No retractions.  Gastrointestinal: Soft and nontender.  Foley catheter appropriately in place with balloon adequately inflated.  No leakage from the urethra or Foley catheter at this time.  Foley bag is appropriately filled with urine. Musculoskeletal: No lower extremity tenderness .  No joint effusions. Neurologic:  Normal speech and language. No gross focal neurologic deficits are appreciated.  Skin:  Skin is warm, dry and intact. No rash noted. Psychiatric: Mood and affect are normal. Speech and behavior are normal.  ____________________________________________   LABS (all labs ordered are listed, but only abnormal results are displayed)  No results found for this or any previous visit (from the past 24  hour(s)). ____________________________________________ ____________________________________________   PROCEDURES  Procedure(s) performed:  Procedures    Critical Care performed: no ____________________________________________   INITIAL IMPRESSION / ASSESSMENT AND PLAN / ED COURSE  Pertinent labs & imaging results that were available during my care of the patient were reviewed by me and considered in my medical decision making (see chart for details).  DDX: Foley catheter displacement or dislodgment, balloon rupture, false passage insertion, overactive bladder.  Craig Wilson is a 77 y.o. who presents to the ED with symptoms as described above.  Patient is AFVSS in ED. Exam as above. Given current presentation have considered the above differential.  Bladder scan shows no evidence of obstruction.  Will contact urology for further recommendations.  Clinical Course as of Mar 16 2106  Mon Mar 16, 2018  2026 Bladder is decompressed.  Discussed case with Dr. Jeffie Pollock of urology does not recommend exchanging Foley given concern for abrasion  or injury to artificial sphincter.  Has recommending trial of oxybutynin 3 times daily.  Patient stable and appropriate for outpatient follow-up with urology.   [PR]    Clinical Course User Index [PR] Merlyn Lot, MD     ____________________________________________   FINAL CLINICAL IMPRESSION(S) / ED DIAGNOSES  Final diagnoses:  Foley catheter problem, initial encounter [facility])      NEW MEDICATIONS STARTED DURING THIS VISIT:  Discharge Medication List as of 03/16/2018  8:39 PM    START taking these medications   Details  oxybutynin (DITROPAN) 5 MG tablet Take 1 tablet (5 mg total) by mouth 3 (three) times daily for 5 days., Starting Mon 03/16/2018, Until Sat 03/21/2018, Normal         Note:  This document was prepared using Dragon voice recognition software and may include unintentional dictation errors.     Merlyn Lot, MD 03/16/18 2109

## 2018-03-16 NOTE — ED Notes (Signed)
Bladder scan showed no residual urine

## 2018-03-16 NOTE — Discharge Instructions (Signed)
Please follow up with Dr. Erlene Quan ASAP.  Return for fevers, pain or inability to empty bladder.

## 2018-03-16 NOTE — Telephone Encounter (Signed)
Angie states that this patient called because he is upset, he believes that the Cysto he had done lead to him being in urinary retention. He ended up in the ED where there was a cath placed. Angie wanted you to call the patient to offer verbal reassurance. Thanks

## 2018-03-16 NOTE — ED Notes (Signed)
Catheter leg bag changed

## 2018-03-20 NOTE — Telephone Encounter (Signed)
Patient went back to ED over the night

## 2018-03-24 ENCOUNTER — Ambulatory Visit (INDEPENDENT_AMBULATORY_CARE_PROVIDER_SITE_OTHER): Payer: Medicare Other | Admitting: Family Medicine

## 2018-03-24 DIAGNOSIS — N393 Stress incontinence (female) (male): Secondary | ICD-10-CM

## 2018-03-24 MED ORDER — MIRABEGRON ER 25 MG PO TB24: 25.0000 mg | ORAL_TABLET | Freq: Every day | ORAL | 11 refills | Status: DC

## 2018-03-24 NOTE — Progress Notes (Signed)
Catheter Removal  Patient is present today for a catheter removal.  36ml of water was drained from the balloon. A 12FR foley cath was removed from the bladder no complications were noted . Patient tolerated well.  Preformed by: Elberta Leatherwood, CMA  Follow up/ Additional notes: At 2:00pm for PVR  Bladder Scan Patient cannot void: 27ml Performed By: Dorette Grate, CMA  Patient has a artificial sphincter and he was having complication with getting the reservoir to reset after having the catheter in place. Fonnie Jarvis, CMA helped get it reset and patient was taught again on what to do to make the Sphincter work properly. Patient tolerated well.

## 2018-04-07 ENCOUNTER — Ambulatory Visit: Payer: Self-pay | Admitting: Urology

## 2018-04-08 ENCOUNTER — Encounter: Payer: Self-pay | Admitting: Urology

## 2018-04-27 ENCOUNTER — Telehealth: Payer: Self-pay | Admitting: Urology

## 2018-04-27 NOTE — Telephone Encounter (Signed)
Would you reach out to Craig Wilson and see if he is voiding well and if he is okay with his AUS?  He was on my schedule a couple of weeks ago and was a No show.

## 2018-04-28 NOTE — Telephone Encounter (Signed)
Left vm to call back

## 2018-05-04 NOTE — Telephone Encounter (Signed)
2nd attempt made, left vm to call back

## 2018-05-05 NOTE — Telephone Encounter (Signed)
Pt states he is doing well and has no complaints at this time.

## 2018-09-15 ENCOUNTER — Telehealth: Payer: Self-pay

## 2018-09-15 NOTE — Telephone Encounter (Signed)
Called patient from recall list.  Reviewed telehealth appointment options.  Patient denied and refused Telehealth visit.  Recall place for August.

## 2018-10-20 ENCOUNTER — Telehealth: Payer: Self-pay

## 2018-10-20 NOTE — Telephone Encounter (Signed)
Called patient.  No answer. LMOV.  Would like to offer an in office appointment with Christell Faith, PA

## 2019-01-09 NOTE — Progress Notes (Signed)
Cardiology Office Note  Date:  01/11/2019   ID:  Craig Wilson, Craig Wilson 1940-11-08, MRN [id number]  PCP:  Craig Hector, MD   Chief Complaint  Patient presents with  . Other    12 month follow up. Patient c/o SOB. Meds reviewed verbally with patient.     HPI:  Craig Wilson is a very pleasant 78 year old gentleman with history of  Parkinson's,  hypertension,  diabetes,  prostate cancer with prior radiation treatment,  hyperlipidemia  CT coronary calcium score score of 7 who presents for evaluation of shortness of breath, coronary artery disease, hypertension.  Chronic SOB, Seen by pulmonary, on symbicort No regular exercise program, weight trending upwards  Does not check his blood pressure at home BP elevated today on initial check 185 systolic, improved on repeat 631 systolic   legs continue to be weak Uses a walker  sleeping all the time  Chronic stable mild shortness of breath with hills and stairs  atenoilol held ion the past for bradycardia  Previous CT coronary calcium  score of 7  Denies any leg swelling Prior history of chronic diarrhea  Lab work reviewed Total chol 141, LDL 68 HBa1C 6.3  EKG personally reviewed by myself on todays visit Shows normal sinus rhythm rate 73 bpm, right bundle branch block  Other past medical history reviewed  strong family history. Father had MI in his 82s, mother had stroke in her 74s Brother has cancer   PMH:   has a past medical history of Anemia, Bipolar disorder ([place]), Chronic diarrhea, Colon polyps, Depression, Dermatophytosis, Diabetic neuropathy ([place]), DJD (degenerative joint disease), Dyspnea, Gout, Gross hematuria, HLD (hyperlipidemia), HTN (hypertension), Hyperkeratosis, Onychomycosis, Parkinson disease ([place]), Prostate cancer ([place]), Sleep apnea, Tremor, and Type 2 diabetes mellitus ([place]).  PSH:    Past Surgical History:  Procedure Laterality Date  . cataract surgery Bilateral   . COLONOSCOPY W/ BIOPSIES     . FOOT SURGERY    . PROSTATE SURGERY     removed  . status post implantation of artificial urinary sphincter     . TONSILLECTOMY      Current Outpatient Medications  Medication Sig Dispense Refill  . atorvastatin (LIPITOR) 40 MG tablet Take 40 mg by mouth daily.    Marland Kitchen glucose blood test strip 1 each by Other route as needed for other. Use as instructed    . lithium carbonate (LITHOBID) 300 MG CR tablet Take 300 mg by mouth 2 (two) times daily.     Marland Kitchen losartan (COZAAR) 50 MG tablet Take 50 mg by mouth daily.    . metFORMIN (GLUCOPHAGE) 500 MG tablet Take 500 mg by mouth 2 (two) times daily with a meal.    . MICROLET LANCETS MISC by Does not apply route.    Marland Kitchen MITIGARE 0.6 MG CAPS Take 1 capsule by mouth daily.     Marland Kitchen omeprazole (PRILOSEC) 40 MG capsule Take 40 mg by mouth daily.     Marland Kitchen rOPINIRole (REQUIP) 2 MG tablet daily.   5  . traZODone (DESYREL) 50 MG tablet 50 mg at bedtime.      No current facility-administered medications for this visit.     Allergies:   Carbidopa w-levodopa, Carbidopa-levodopa, Latex, and Solifenacin   Social History:  The patient  reports that he quit smoking about 19 years ago. He has never used smokeless tobacco. He reports that he does not drink alcohol or use drugs.   Family History:   family history includes Heart attack (age  of onset: 48) in his father; Heart disease in his mother; Hyperlipidemia in his father; Hypertension in his father.    Review of Systems: Review of Systems  Constitutional: Negative.   Respiratory: Negative.   Cardiovascular: Negative.   Gastrointestinal: Negative.   Musculoskeletal: Negative.        Unsteady gait  Neurological: Negative.   Psychiatric/Behavioral: Negative.   All other systems reviewed and are negative.    PHYSICAL EXAM: VS:  BP (!) 160/58 (BP Location: Left Arm, Patient Position: Sitting, Cuff Size: Normal)   Pulse 73   Ht 5\' 9"  (1.753 m)   Wt 177 lb (80.3 kg)   BMI 26.14 kg/m  , BMI Body mass  index is 26.14 kg/m. GEN: Well nourished, well developed, in no acute distress, walks with a cane  HEENT: normal  Neck: no JVD, carotid bruits, or masses Cardiac: RRR; no murmurs, rubs, or gallops,no edema  Respiratory:  clear to auscultation bilaterally, normal work of breathing GI: soft, nontender, nondistended, + BS MS: no deformity or atrophy  Skin: warm and dry, no rash Neuro:  Strength and sensation are intact Psych: euthymic mood, full affect    Recent Labs: 01/14/2018: ALT 17 03/15/2018: BUN 18; Creatinine, Ser 1.02; Hemoglobin 11.8; Platelets 174; Potassium 4.8; Sodium 134    Lipid Panel No results found for: CHOL, HDL, LDLCALC, TRIG    Wt Readings from Last 3 Encounters:  01/11/19 177 lb (80.3 kg)  03/16/18 179 lb 14.3 oz (81.6 kg)  03/15/18 180 lb (81.6 kg)      ASSESSMENT AND PLAN:  Essential hypertension - Plan: EKG 12-Lead Continue losartan 50 mg daily, new prescription sent in Previously held atenolol for low heart rate  Mixed hyperlipidemia  Minimal underlying coronary calcifications seen on CT scan Cholesterol is at goal on the current lipid regimen. No changes to the medications were made.  SOB (shortness of breath) Reports stable chronic mild shortness of breath. Recommended a regular exercise program  Parkinson's disease ([place]) Recommended walking program Followed by neurology  Type 2 diabetes mellitus with complication, without long-term current use of insulin (HCC) Hemoglobin A1c with slight trend upwards Weight continues to run high  Anemia, unspecified type Episode of anemia Junior 2018, recovered since then Stable   Total encounter time more than 25 minutes  Greater than 50% was spent in counseling and coordination of care with the patient   Disposition:   F/U  12 months   Orders Placed This Encounter  Procedures  . EKG 12-Lead     Signed, Esmond Plants, M.D., Ph.D. 01/11/2019  [place],  [place]

## 2019-01-11 ENCOUNTER — Other Ambulatory Visit: Payer: Self-pay

## 2019-01-11 ENCOUNTER — Encounter: Payer: Self-pay | Admitting: Cardiovascular Disease

## 2019-01-11 ENCOUNTER — Ambulatory Visit (INDEPENDENT_AMBULATORY_CARE_PROVIDER_SITE_OTHER): Payer: Medicare Other | Admitting: Cardiovascular Disease

## 2019-01-11 VITALS — BP 140/58 | HR 73 | Ht 69.0 in | Wt 177.0 lb

## 2019-01-11 DIAGNOSIS — G20A1 Parkinson's disease without dyskinesia, without mention of fluctuations: Secondary | ICD-10-CM

## 2019-01-11 DIAGNOSIS — R931 Abnormal findings on diagnostic imaging of heart and coronary circulation: Secondary | ICD-10-CM

## 2019-01-11 DIAGNOSIS — G20 Parkinson's disease: Secondary | ICD-10-CM

## 2019-01-11 DIAGNOSIS — E118 Type 2 diabetes mellitus with unspecified complications: Secondary | ICD-10-CM

## 2019-01-11 DIAGNOSIS — I10 Essential (primary) hypertension: Secondary | ICD-10-CM

## 2019-01-11 DIAGNOSIS — E782 Mixed hyperlipidemia: Secondary | ICD-10-CM | POA: Diagnosis not present

## 2019-01-11 DIAGNOSIS — R0602 Shortness of breath: Secondary | ICD-10-CM | POA: Diagnosis not present

## 2019-01-11 MED ORDER — LOSARTAN POTASSIUM 50 MG PO TABS: 50.0000 mg | ORAL_TABLET | Freq: Every day | ORAL | 3 refills | Status: DC

## 2019-01-11 NOTE — Patient Instructions (Signed)
Medication Instructions:  Losartan 90 day script   If you need a refill on your cardiac medications before your next appointment, please call your pharmacy.    Lab work: No new labs needed   If you have labs (blood work) drawn today and your tests are completely normal, you will receive your results only by: Marland Kitchen MyChart Message (if you have MyChart) OR . A paper copy in the mail If you have any lab test that is abnormal or we need to change your treatment, we will call you to review the results.   Testing/Procedures: No new testing needed   Follow-Up: At [facility], you and your health needs are our priority.  As part of our continuing mission to provide you with exceptional heart care, we have created designated Provider Care Teams.  These Care Teams include your primary Cardiologist (physician) and Advanced Practice Providers (APPs -  Physician Assistants and Nurse Practitioners) who all work together to provide you with the care you need, when you need it.  . You will need a follow up appointment in 12 months .   Please call our office 2 months in advance to schedule this appointment.    . Providers on your designated Care Team:   . Murray Hodgkins, NP . Christell Faith, PA-C . Marrianne Mood, PA-C  Any Other Special Instructions Will Be Listed Below (If Applicable).  For educational health videos Log in to : [email or web address] Or : [email or web address], password : [email or web address]

## 2019-03-10 NOTE — Progress Notes (Signed)
03/11/2019 8:05 AM   Craig Wilson 04/06/1941 [id number]  Referring provider: Adin Hector, MD [place] [facility] [place],  [place] [address]  Chief Complaint  Patient presents with  . Urinary Incontinence    1year    HPI: Patient is a 78 year old male with a history of prostate cancer, incontinence (s/p AUS ~10 years ago), history of gross hematuria who presents today for a yearly follow up.    Urinary incontinence AUS was placed at [facility] in 2006.  Patient states the AUS is working properly.   He is taking Myrbetriq 25 mg daily.  Bladder scan was 0 mL.    Prostate cancer Patient underwent RRP by Dr. Bernardo Heater, radiation by Dr. Donella Stade, and an AUS placed at [facility] in 2006.  I do not have the patient's pathology or surgical records.  PSA's remain undetectable.      History of hematuria (high risk) Former smoker.  Hematuria work up for gross hematuria.  Patient underwent a hematuria workup with CT urogram and cystoscopy with bilateral retrogrades in 2016.  No malignancy was discovered.  CTU in 02/2018 revealed normal adrenal glands. No renal calculi or hydronephrosis. No hydroureter or ureteric calculi. No bladder calculi.  Upper pole 7.2 cm right renal cyst. Bilateral too small to characterize renal lesions, most likely cysts. Moderate renal collecting system opacification on delayed images. Portions of the distal right ureter are incompletely opacified on delayed images. No ureteric or collecting system filling defects.  No enhancing bladder mass or bladder filling defect on delayed images.  Urethral sphincter device is unchanged in position.  Cystoscopy with Dr. Erlene Quan in 02/2018 was NED.  His UA today has 3-10 RBC's.     Nocturia Patient continues to have nocturia x 3-4  PMH: Past Medical History:  Diagnosis Date  . Anemia   . Bipolar disorder ([place])   . Chronic diarrhea   . Colon polyps   . Depression   . Dermatophytosis   . Diabetic neuropathy  ([place])   . DJD (degenerative joint disease)   . Dyspnea   . Gout   . Gross hematuria   . HLD (hyperlipidemia)   . HTN (hypertension)   . Hyperkeratosis   . Onychomycosis   . Parkinson disease ([place])   . Prostate cancer ([place])   . Sleep apnea   . Tremor   . Type 2 diabetes mellitus [facility])     Surgical History: Past Surgical History:  Procedure Laterality Date  . cataract surgery Bilateral   . COLONOSCOPY W/ BIOPSIES    . FOOT SURGERY    . PROSTATE SURGERY     removed  . status post implantation of artificial urinary sphincter     . TONSILLECTOMY      Home Medications:  Allergies as of 03/11/2019      Reactions   Carbidopa W-levodopa Other (See Comments)   irrational behavior , felt zombie like   Carbidopa-levodopa Other (See Comments)   irrational behavior , felt zombie like   Latex Other (See Comments)   Internal such as catheters. Latex gloves are okay   Solifenacin Other (See Comments)      Medication List       Accurate as of March 11, 2019 11:59 PM. If you have any questions, ask your nurse or doctor.        atorvastatin 40 MG tablet Commonly known as: LIPITOR Take 40 mg by mouth daily.   carbidopa-levodopa 25-100 MG tablet Commonly known as:  SINEMET IR   glucose blood test strip 1 each by Other route as needed for other. Use as instructed   lithium carbonate 300 MG CR tablet Commonly known as: LITHOBID Take 300 mg by mouth 2 (two) times daily.   losartan 50 MG tablet Commonly known as: COZAAR Take 1 tablet (50 mg total) by mouth daily.   metFORMIN 500 MG tablet Commonly known as: GLUCOPHAGE Take 500 mg by mouth 2 (two) times daily with a meal.   Microlet Lancets Misc by Does not apply route.   mirabegron ER 25 MG Tb24 tablet Commonly known as: MYRBETRIQ Take 1 tablet (25 mg total) by mouth daily. Started by: Zara Council, PA-C   Mitigare 0.6 MG Caps Generic drug: Colchicine Take 1 capsule by mouth daily.   omeprazole 40 MG capsule  Commonly known as: PRILOSEC Take 40 mg by mouth daily.   rOPINIRole 2 MG tablet Commonly known as: REQUIP daily.   traZODone 50 MG tablet Commonly known as: DESYREL 50 mg at bedtime.       Allergies:  Allergies  Allergen Reactions  . Carbidopa W-Levodopa Other (See Comments)    irrational behavior , felt zombie like  . Carbidopa-Levodopa Other (See Comments)    irrational behavior , felt zombie like  . Latex Other (See Comments)    Internal such as catheters. Latex gloves are okay  . Solifenacin Other (See Comments)    Family History: Family History  Problem Relation Age of Onset  . Heart disease Mother   . Heart attack Father 14  . Hypertension Father   . Hyperlipidemia Father   . Kidney cancer Neg Hx   . Prostate cancer Neg Hx   . Bladder Cancer Neg Hx     Social History:  reports that he quit smoking about 19 years ago. He has never used smokeless tobacco. He reports that he does not drink alcohol or use drugs.  ROS: UROLOGY Frequent Urination?: Yes Hard to postpone urination?: No Burning/pain with urination?: No Get up at night to urinate?: Yes Leakage of urine?: No Urine stream starts and stops?: No Trouble starting stream?: No Do you have to strain to urinate?: No Blood in urine?: No Urinary tract infection?: No Sexually transmitted disease?: No Injury to kidneys or bladder?: No Painful intercourse?: No Weak stream?: No Erection problems?: No Penile pain?: No  Gastrointestinal Nausea?: No Vomiting?: No Indigestion/heartburn?: No Diarrhea?: No Constipation?: No  Constitutional Fever: No Night sweats?: No Weight loss?: No Fatigue?: No  Skin Skin rash/lesions?: No Itching?: No  Eyes Blurred vision?: No Double vision?: No  Ears/Nose/Throat Sore throat?: No Sinus problems?: No  Hematologic/Lymphatic Swollen glands?: No Easy bruising?: No  Cardiovascular Leg swelling?: No Chest pain?: No  Respiratory Cough?: No Shortness  of breath?: No  Endocrine Excessive thirst?: No  Musculoskeletal Back pain?: No Joint pain?: No  Neurological Headaches?: No Dizziness?: No  Psychologic Depression?: No Anxiety?: No  Physical Exam: BP (!) 173/67   Pulse 78   Ht 5\' 10"  (1.778 m)   Wt 180 lb (81.6 kg)   BMI 25.83 kg/m   Constitutional:  Well nourished. Alert and oriented, No acute distress. HEENT: [place] AT, moist mucus membranes.  Trachea midline, no masses. Cardiovascular: No clubbing, cyanosis, or edema. Respiratory: Normal respiratory effort, no increased work of breathing. GI: Abdomen is soft, non tender, non distended, no abdominal masses. Liver and spleen not palpable.  No hernias appreciated.  Stool sample for occult testing is not indicated.   GU: No  CVA tenderness.  No bladder fullness or masses.  Patient with circumcised phallus.  Urethral meatus is patent.  No penile discharge. No penile lesions or rashes. Scrotum without lesions, cysts, rashes and/or edema.  Testicles are located scrotally bilaterally. No masses are appreciated in the testicles. Left and right epididymis are normal.  AUS pump located in the right scrotum.   Rectal: Not performed.   Skin: No rashes, bruises or suspicious lesions. Lymph: No inguinal adenopathy. Neurologic: Grossly intact, no focal deficits, moving all 4 extremities. Psychiatric: Normal mood and affect.     Laboratory Data: Lab Results  Component Value Date   WBC 11.6 (H) 03/15/2018   HGB 11.8 (L) 03/15/2018   HCT 34.7 (L) 03/15/2018   MCV 96.7 03/15/2018   PLT 174 03/15/2018    Lab Results  Component Value Date   CREATININE 1.02 03/15/2018     PSA history:  0.01 ng/mL on 02/15/2014  0.01 ng/mL on 08/18/2014  Component     Latest Ref Rng & Units 02/21/2016 12/31/2017 01/12/2018 03/11/2019  Prostate Specific Ag, Serum     0.0 - 4.0 ng/mL <0.1 CANCELED <0.1 <0.1    I have reviewed the labs.  Pertinent imaging Results for MARKISE, TREMAIN (MRN  [id number]) as of 03/23/2019 08:14  Ref. Range 03/11/2019 13:53  Scan Result Unknown 65ml    Assessment & Plan:    1. Incontinence Not bothersome Continue Myrbetriq 25 mg daily RTC in one year for PVR  2. History of prostate cancer PSA remains undetectable  3. History of hematuria Hematuria work up completed in 02/2018 - negative No report of gross hematuria  UA today 3-10 RBC's RTC in one year for UA - patient to report any gross hematuria in the interim    4. Nocturia  Continue Myrbetriq 25 mg daily -refills given   Return in about 1 year (around 03/10/2020) for IPSS, PVR, PSA and exam.  Zara Council, [facility]  [address] [place], [place] [address] [phone]

## 2019-03-11 ENCOUNTER — Ambulatory Visit (INDEPENDENT_AMBULATORY_CARE_PROVIDER_SITE_OTHER): Payer: Medicare Other | Admitting: Urology

## 2019-03-11 ENCOUNTER — Encounter: Payer: Self-pay | Admitting: Urology

## 2019-03-11 ENCOUNTER — Other Ambulatory Visit: Payer: Self-pay

## 2019-03-11 VITALS — BP 173/67 | HR 78 | Ht 70.0 in | Wt 180.0 lb

## 2019-03-11 DIAGNOSIS — R351 Nocturia: Secondary | ICD-10-CM | POA: Diagnosis not present

## 2019-03-11 DIAGNOSIS — N393 Stress incontinence (female) (male): Secondary | ICD-10-CM

## 2019-03-11 DIAGNOSIS — Z8546 Personal history of malignant neoplasm of prostate: Secondary | ICD-10-CM

## 2019-03-11 DIAGNOSIS — Z87448 Personal history of other diseases of urinary system: Secondary | ICD-10-CM | POA: Diagnosis not present

## 2019-03-11 LAB — MICROSCOPIC EXAMINATION: Bacteria, UA: NONE SEEN

## 2019-03-11 LAB — URINALYSIS, COMPLETE
Bilirubin, UA: NEGATIVE
Glucose, UA: NEGATIVE
Ketones, UA: NEGATIVE
Leukocytes,UA: NEGATIVE
Nitrite, UA: NEGATIVE
Protein,UA: NEGATIVE
Specific Gravity, UA: 1.015 (ref 1.005–1.030)
Urobilinogen, Ur: 0.2 mg/dL (ref 0.2–1.0)
pH, UA: 7 (ref 5.0–7.5)

## 2019-03-11 LAB — BLADDER SCAN AMB NON-IMAGING

## 2019-03-11 MED ORDER — MIRABEGRON ER 25 MG PO TB24: 25.0000 mg | ORAL_TABLET | Freq: Every day | ORAL | 3 refills | Status: DC

## 2019-03-12 ENCOUNTER — Telehealth: Payer: Self-pay | Admitting: *Deleted

## 2019-03-12 LAB — PSA: Prostate Specific Ag, Serum: <0.1 ng/mL (ref 0.0–4.0)

## 2019-03-12 NOTE — Telephone Encounter (Signed)
-----   Message from Nori Riis, PA-C sent at 03/12/2019  7:55 AM EDT ----- Please let Mr. Mannings know that his PSA is undetectable.

## 2019-03-12 NOTE — Telephone Encounter (Signed)
Notified patient as instructed, patient pleased. Discussed follow-up appointments, patient agrees  

## 2019-03-29 NOTE — Progress Notes (Addendum)
03/30/2019 9:26 AM   Craig Wilson 1940-07-25 [id number]  Referring provider: Adin Hector, MD [place] [facility] [place],  [place] [address]  Chief Complaint  Patient presents with  . Urinary Incontinence    HPI: Patient is a 78 year old male with a history of prostate cancer, incontinence (s/p AUS ~10 years ago), history of gross hematuria who presents today with the complaint that his AUS is dripping.    Urge incontinence He states that the leakage started on Friday night.  He was straining to have a BM that evening and he started to leak.  AUS was placed at [facility] in 2006.  He is taking Myrbetriq 25 mg daily.  Bladder scan was 0 mL.    Prostate cancer Patient underwent RRP by Dr. Bernardo Heater, radiation by Dr. Donella Stade, and an AUS placed at [facility] in 2006.  I do not have the patient's pathology or surgical records.  PSA's remain undetectable.      History of hematuria (high risk) Former smoker.  Hematuria work up for gross hematuria.  Patient underwent a hematuria workup with CT urogram and cystoscopy with bilateral retrogrades in 2016.  No malignancy was discovered.  CTU in 02/2018 revealed normal adrenal glands. No renal calculi or hydronephrosis. No hydroureter or ureteric calculi. No bladder calculi.  Upper pole 7.2 cm right renal cyst. Bilateral too small to characterize renal lesions, most likely cysts. Moderate renal collecting system opacification on delayed images. Portions of the distal right ureter are incompletely opacified on delayed images. No ureteric or collecting system filling defects.  No enhancing bladder mass or bladder filling defect on delayed images.  Urethral sphincter device is unchanged in position.  Cystoscopy with Dr. Erlene Quan in 02/2018 was NED.    Nocturia Patient continues to have nocturia x 3-4  PMH: Past Medical History:  Diagnosis Date  . Anemia   . Bipolar disorder ([place])   . Chronic diarrhea   . Colon polyps   .  Depression   . Dermatophytosis   . Diabetic neuropathy ([place])   . DJD (degenerative joint disease)   . Dyspnea   . Gout   . Gross hematuria   . HLD (hyperlipidemia)   . HTN (hypertension)   . Hyperkeratosis   . Onychomycosis   . Parkinson disease ([place])   . Prostate cancer ([place])   . Sleep apnea   . Tremor   . Type 2 diabetes mellitus [facility])     Surgical History: Past Surgical History:  Procedure Laterality Date  . cataract surgery Bilateral   . COLONOSCOPY W/ BIOPSIES    . FOOT SURGERY    . PROSTATE SURGERY     removed  . status post implantation of artificial urinary sphincter     . TONSILLECTOMY      Home Medications:  Allergies as of 03/30/2019      Reactions   Carbidopa W-levodopa Other (See Comments)   irrational behavior , felt zombie like   Carbidopa-levodopa Other (See Comments)   irrational behavior , felt zombie like   Latex Other (See Comments)   Internal such as catheters. Latex gloves are okay   Solifenacin Other (See Comments)      Medication List       Accurate as of March 30, 2019 11:59 PM. If you have any questions, ask your nurse or doctor.        atorvastatin 40 MG tablet Commonly known as: LIPITOR Take 40 mg by mouth daily.  carbidopa-levodopa 25-100 MG tablet Commonly known as: SINEMET IR   glucose blood test strip 1 each by Other route as needed for other. Use as instructed   lithium carbonate 300 MG CR tablet Commonly known as: LITHOBID Take 300 mg by mouth 2 (two) times daily.   losartan 50 MG tablet Commonly known as: COZAAR Take 1 tablet (50 mg total) by mouth daily.   metFORMIN 500 MG tablet Commonly known as: GLUCOPHAGE Take 500 mg by mouth 2 (two) times daily with a meal.   Microlet Lancets Misc by Does not apply route.   mirabegron ER 25 MG Tb24 tablet Commonly known as: MYRBETRIQ Take 1 tablet (25 mg total) by mouth daily.   Mitigare 0.6 MG Caps Generic drug: Colchicine Take 1 capsule by mouth daily.    omeprazole 40 MG capsule Commonly known as: PRILOSEC Take 40 mg by mouth daily.   rOPINIRole 2 MG tablet Commonly known as: REQUIP daily.   traZODone 50 MG tablet Commonly known as: DESYREL 50 mg at bedtime.       Allergies:  Allergies  Allergen Reactions  . Carbidopa W-Levodopa Other (See Comments)    irrational behavior , felt zombie like  . Carbidopa-Levodopa Other (See Comments)    irrational behavior , felt zombie like  . Latex Other (See Comments)    Internal such as catheters. Latex gloves are okay  . Solifenacin Other (See Comments)    Family History: Family History  Problem Relation Age of Onset  . Heart disease Mother   . Heart attack Father 42  . Hypertension Father   . Hyperlipidemia Father   . Kidney cancer Neg Hx   . Prostate cancer Neg Hx   . Bladder Cancer Neg Hx     Social History:  reports that he quit smoking about 19 years ago. He has never used smokeless tobacco. He reports that he does not drink alcohol or use drugs.  ROS: UROLOGY Frequent Urination?: Yes Hard to postpone urination?: No Burning/pain with urination?: No Get up at night to urinate?: Yes Leakage of urine?: Yes Urine stream starts and stops?: Yes Trouble starting stream?: No Do you have to strain to urinate?: No Blood in urine?: No Urinary tract infection?: No Sexually transmitted disease?: No Injury to kidneys or bladder?: No Painful intercourse?: No Weak stream?: No Erection problems?: No Penile pain?: No  Gastrointestinal Nausea?: No Vomiting?: No Indigestion/heartburn?: No Diarrhea?: No Constipation?: No  Constitutional Fever: No Night sweats?: No Weight loss?: No Fatigue?: No  Skin Skin rash/lesions?: No Itching?: No  Eyes Blurred vision?: No Double vision?: No  Ears/Nose/Throat Sore throat?: No Sinus problems?: No  Hematologic/Lymphatic Swollen glands?: No Easy bruising?: No  Cardiovascular Leg swelling?: No Chest pain?: No   Respiratory Cough?: No Shortness of breath?: No  Endocrine Excessive thirst?: No  Musculoskeletal Back pain?: No Joint pain?: No  Neurological Headaches?: No Dizziness?: No  Psychologic Depression?: No Anxiety?: No  Physical Exam: BP (!) 169/66   Pulse 80   Ht 5\' 9"  (1.753 m)   Wt 176 lb (79.8 kg)   BMI 25.99 kg/m   Constitutional:  Well nourished. Alert and oriented, No acute distress. HEENT: [place] AT, moist mucus membranes.  Trachea midline, no masses. Cardiovascular: No clubbing, cyanosis, or edema. Respiratory: Normal respiratory effort, no increased work of breathing. GI: Abdomen is soft, non tender, non distended, no abdominal masses.  AUS reservoir is palpated in the right lower quadrant.  GU: No CVA tenderness.  No bladder fullness or  masses.  Patient with circumcised phallus.  Urethral meatus is patent.  No penile discharge. No penile lesions or rashes. Scrotum without lesions, cysts, rashes and/or edema.  Testicles are located scrotally bilaterally. No masses are appreciated in the testicles. Left and right epididymis are normal.  The AUS pump is cycled through and appears to be functioning properly.    Rectal: Not performed.   Skin: No rashes, bruises or suspicious lesions. Lymph: No cervical or inguinal adenopathy. Neurologic: Grossly intact, no focal deficits, moving all 4 extremities. Psychiatric: Normal mood and affect.    Laboratory Data: Lab Results  Component Value Date   WBC 11.6 (H) 03/15/2018   HGB 11.8 (L) 03/15/2018   HCT 34.7 (L) 03/15/2018   MCV 96.7 03/15/2018   PLT 174 03/15/2018    Lab Results  Component Value Date   CREATININE 1.02 03/15/2018     PSA history:  0.01 ng/mL on 02/15/2014  0.01 ng/mL on 08/18/2014  Component     Latest Ref Rng & Units 02/21/2016 12/31/2017 01/12/2018 03/11/2019  Prostate Specific Ag, Serum     0.0 - 4.0 ng/mL <0.1 CANCELED <0.1 <0.1    I have reviewed the labs.  Pertinent imaging Results for  Craig Wilson, Craig Wilson (MRN [id number]) as of 03/23/2019 08:14  Ref. Range 03/11/2019 13:53  Scan Result Unknown 32ml    Assessment & Plan:    1. Incontinence AUS appears to be functioning properly Patient will contact us if he should experience any further issues  2. History of prostate cancer PSA remains undetectable  3. History of hematuria Hematuria work up completed in 02/2018 - negative No report of gross hematuria  RTC in one year for UA - patient to report any gross hematuria in the interim    4. Nocturia  Continue Myrbetriq 25 mg daily -refills given   Return if symptoms worsen or fail to improve.  Zara Council, PA-C  [facility] [address] [place], [place] [address] [phone]

## 2019-03-30 ENCOUNTER — Encounter: Payer: Self-pay | Admitting: Urology

## 2019-03-30 ENCOUNTER — Ambulatory Visit (INDEPENDENT_AMBULATORY_CARE_PROVIDER_SITE_OTHER): Payer: Medicare Other | Admitting: Urology

## 2019-03-30 ENCOUNTER — Other Ambulatory Visit: Payer: Self-pay

## 2019-03-30 VITALS — BP 169/66 | HR 80 | Ht 69.0 in | Wt 176.0 lb

## 2019-03-30 DIAGNOSIS — N39498 Other specified urinary incontinence: Secondary | ICD-10-CM

## 2019-03-30 LAB — BLADDER SCAN AMB NON-IMAGING

## 2019-03-31 ENCOUNTER — Ambulatory Visit (INDEPENDENT_AMBULATORY_CARE_PROVIDER_SITE_OTHER): Payer: Medicare Other | Admitting: Urology

## 2019-03-31 ENCOUNTER — Encounter: Payer: Self-pay | Admitting: Urology

## 2019-03-31 VITALS — BP 161/64 | HR 75 | Ht 69.0 in | Wt 174.0 lb

## 2019-03-31 DIAGNOSIS — N393 Stress incontinence (female) (male): Secondary | ICD-10-CM

## 2019-03-31 LAB — BLADDER SCAN AMB NON-IMAGING

## 2019-03-31 NOTE — Progress Notes (Signed)
03/31/2019 9:31 AM   Craig Wilson [birth year] [id number]  Referring provider: Adin Hector, MD [facility] [place],  Craig Wilson [id number]  Chief Complaint  Patient presents with  . Follow-up    HPI: Patient is a 78 year old male with a history of prostate cancer, incontinence (s/p AUS ~10 years ago), history of gross hematuria who presents today with the complaint that his AUS is dripping.    Urge incontinence He states that the leakage started on Friday night.  He was straining to have a BM that evening and he started to leak.  AUS was placed at [facility] in 2006.  He is taking Myrbetriq 25 mg daily.  He presented yesterday and we cycled the AUS and it appeared to be functioning properly.  He states that it is continuing to leak, especially with straining.    Prostate cancer Patient underwent RRP by Dr. Bernardo Wilson, radiation by Dr. Donella Wilson, and an AUS placed at [facility] in 2006.  I do not have the patient's pathology or surgical records.  PSA's remain undetectable.      History of hematuria (high risk) Former smoker.  Hematuria work up for gross hematuria.  Patient underwent a hematuria workup with CT urogram and cystoscopy with bilateral retrogrades in 2016.  No malignancy was discovered.  CTU in 02/2018 revealed normal adrenal glands. No renal calculi or hydronephrosis. No hydroureter or ureteric calculi. No bladder calculi.  Upper pole 7.2 cm right renal cyst. Bilateral too small to characterize renal lesions, most likely cysts. Moderate renal collecting system opacification on delayed images. Portions of the distal right ureter are incompletely opacified on delayed images. No ureteric or collecting system filling defects.  No enhancing bladder mass or bladder filling defect on delayed images.  Urethral sphincter device is unchanged in position.  Cystoscopy with Dr. Erlene Wilson in 02/2018 was NED.  He does not report any gross hematuria.   Nocturia Patient continues  to have nocturia x 3-4  PMH: Past Medical History:  Diagnosis Date  . Anemia   . Bipolar disorder ([place])   . Chronic diarrhea   . Colon polyps   . Depression   . Dermatophytosis   . Diabetic neuropathy ([place])   . DJD (degenerative joint disease)   . Dyspnea   . Gout   . Gross hematuria   . HLD (hyperlipidemia)   . HTN (hypertension)   . Hyperkeratosis   . Onychomycosis   . Parkinson disease ([place])   . Prostate cancer ([place])   . Sleep apnea   . Tremor   . Type 2 diabetes mellitus Craig Wilson)     Surgical History: Past Surgical History:  Procedure Laterality Date  . cataract surgery Bilateral   . COLONOSCOPY W/ BIOPSIES    . FOOT SURGERY    . PROSTATE SURGERY     removed  . status post implantation of artificial urinary sphincter     . TONSILLECTOMY      Home Medications:  Allergies as of 03/31/2019      Reactions   Carbidopa W-levodopa Other (See Comments)   irrational behavior , felt zombie like   Carbidopa-levodopa Other (See Comments)   irrational behavior , felt zombie like   Latex Other (See Comments)   Internal such as catheters. Latex gloves are okay   Solifenacin Other (See Comments)      Medication List       Accurate as of March 31, 2019  9:31 AM. If you have any  questions, ask your nurse or doctor.        atorvastatin 40 MG tablet Commonly known as: LIPITOR Take 40 mg by mouth daily.   carbidopa-levodopa 25-100 MG tablet Commonly known as: SINEMET IR   glucose blood test strip 1 each by Other route as needed for other. Use as instructed   lithium carbonate 300 MG CR tablet Commonly known as: LITHOBID Take 300 mg by mouth 2 (two) times daily.   losartan 50 MG tablet Commonly known as: COZAAR Take 1 tablet (50 mg total) by mouth daily.   metFORMIN 500 MG tablet Commonly known as: GLUCOPHAGE Take 500 mg by mouth 2 (two) times daily with a meal.   Microlet Lancets Misc by Does not apply route.   mirabegron ER 25 MG Tb24 tablet Commonly  known as: MYRBETRIQ Take 1 tablet (25 mg total) by mouth daily.   Mitigare 0.6 MG Caps Generic drug: Colchicine Take 1 capsule by mouth daily.   omeprazole 40 MG capsule Commonly known as: PRILOSEC Take 40 mg by mouth daily.   rOPINIRole 2 MG tablet Commonly known as: REQUIP daily.   traZODone 50 MG tablet Commonly known as: DESYREL 50 mg at bedtime.       Allergies:  Allergies  Allergen Reactions  . Carbidopa W-Levodopa Other (See Comments)    irrational behavior , felt zombie like  . Carbidopa-Levodopa Other (See Comments)    irrational behavior , felt zombie like  . Latex Other (See Comments)    Internal such as catheters. Latex gloves are okay  . Solifenacin Other (See Comments)    Family History: Family History  Problem Relation Age of Onset  . Heart disease Mother   . Heart attack Father 74  . Hypertension Father   . Hyperlipidemia Father   . Kidney cancer Neg Hx   . Prostate cancer Neg Hx   . Bladder Cancer Neg Hx     Social History:  reports that he quit smoking about 19 years ago. He has never used smokeless tobacco. He reports that he does not drink alcohol or use drugs.  ROS: UROLOGY Frequent Urination?: Yes Hard to postpone urination?: No Burning/pain with urination?: No Get up at night to urinate?: Yes Leakage of urine?: Yes Urine stream starts and stops?: Yes Trouble starting stream?: No Do you have to strain to urinate?: No Blood in urine?: No Urinary tract infection?: No Sexually transmitted disease?: No Injury to kidneys or bladder?: No Painful intercourse?: No Weak stream?: No Erection problems?: No Penile pain?: No  Gastrointestinal Nausea?: No Vomiting?: No Indigestion/heartburn?: No Diarrhea?: No Constipation?: No  Constitutional Fever: No Night sweats?: No Weight loss?: No Fatigue?: No  Skin Skin rash/lesions?: No Itching?: No  Eyes Blurred vision?: No Double vision?: No  Ears/Nose/Throat Sore throat?: No  Sinus problems?: No  Hematologic/Lymphatic Swollen glands?: No Easy bruising?: No  Cardiovascular Leg swelling?: No Chest pain?: No  Respiratory Cough?: No Shortness of breath?: No  Endocrine Excessive thirst?: No  Musculoskeletal Back pain?: No Joint pain?: No  Neurological Headaches?: No Dizziness?: No  Psychologic Depression?: No Anxiety?: No  Physical Exam: BP (!) 161/64   Pulse 75   Ht 5\' 9"  (1.753 m)   Wt 174 lb (78.9 kg)   BMI 25.70 kg/m   Constitutional:  Well nourished. Alert and oriented, No acute distress. HEENT: [place] AT, moist mucus membranes.  Trachea midline, no masses. Cardiovascular: No clubbing, cyanosis, or edema. Respiratory: Normal respiratory effort, no increased work of breathing. GI: Abdomen  is soft, non tender, non distended, no abdominal masses. GU: No CVA tenderness.  No bladder fullness or masses.  Patient with circumcised phallus.  Urethral meatus is patent.  No penile discharge. No penile lesions or rashes. Scrotum without lesions, cysts, rashes and/or edema.  Testicles are located scrotally bilaterally. No masses are appreciated in the testicles. Left and right epididymis are normal.  AUS pump is cycled again by myself and colleague and appears to be functioning properly.   Rectal: Not performed.  Skin: No rashes, bruises or suspicious lesions. Lymph: No inguinal adenopathy. Neurologic: Grossly intact, no focal deficits, moving all 4 extremities. Psychiatric: Normal mood and affect.    Laboratory Data: Lab Results  Component Value Date   WBC 11.6 (H) 03/15/2018   HGB 11.8 (L) 03/15/2018   HCT 34.7 (L) 03/15/2018   MCV 96.7 03/15/2018   PLT 174 03/15/2018    Lab Results  Component Value Date   CREATININE 1.02 03/15/2018     PSA history:  0.01 ng/mL on 02/15/2014  0.01 ng/mL on 08/18/2014  Component     Latest Ref Rng & Units 02/21/2016 12/31/2017 01/12/2018 03/11/2019  Prostate Specific Ag, Serum     0.0 - 4.0 ng/mL <0.1  CANCELED <0.1 <0.1    I have reviewed the labs.  Pertinent imaging Results for MATHAN, DESTIN (MRN [id number]) as of 03/23/2019 08:14  Ref. Range 03/11/2019 13:53  Scan Result Unknown 49ml    Assessment & Plan:    1. Incontinence AUS appears to be functioning properly - cuff may be compromised - will refer to [facility] for further evaluation of the AUS Patient will contact us if he should experience any further issues  2. History of prostate cancer PSA remains undetectable  3. History of hematuria Hematuria work up completed in 02/2018 - negative No report of gross hematuria  RTC in one year for UA - patient to report any gross hematuria in the interim    4. Nocturia  Continue Myrbetriq 25 mg daily -refills given   Return for refer to [place] .  Zara Council, PA-C  [facility] [address] [place], [place] [address] [phone]

## 2019-04-05 ENCOUNTER — Telehealth: Payer: Self-pay | Admitting: Urology

## 2019-04-05 NOTE — Telephone Encounter (Signed)
Pt asking to speak to [facility] directly, has questions about referral that was sent for him as well as some technical issues with his device, he'd like to discuss, states will only take a couple minutes of your time. Please advise Mr. Hacking at [phone] (this is the number he'd like to be reached on)

## 2019-04-05 NOTE — Telephone Encounter (Signed)
Mr. Craig Wilson is wondering the status of his referral to [facility] for his leaking AUS.  He is continuing to leak and now the pump button is hard.

## 2019-04-06 NOTE — Telephone Encounter (Signed)
I will call him but I just faxed the referral on the 29th. It does take a minute to get an app with [facility]. They have to have the doctor review the notes and they will call him with and app once they have done that.

## 2019-06-22 ENCOUNTER — Telehealth: Payer: Self-pay | Admitting: Urology

## 2019-06-22 NOTE — Telephone Encounter (Signed)
Please let Craig Wilson know that I received his letter today and I am looking into his concerns and will get back to him tomorrow.

## 2019-06-22 NOTE — Telephone Encounter (Signed)
Patient notified and voiced understanding.

## 2019-06-23 ENCOUNTER — Telehealth: Payer: Self-pay | Admitting: Urology

## 2019-06-23 NOTE — Telephone Encounter (Signed)
Spoke with Craig Wilson regarding his concerns as he feels that the note from March 31, 2019 for me did not indicate why we referred him to [facility] urology.  I reviewed the assessment and plan portion of the note which stated we felt the AUS cuff may not be functioning properly and that is why we are referring him to [facility] for further evaluation and management of his AUS.  He stated he was currently at the eye doctor and did not have my notes with him for review will but will review them again when he gets home and call back with any concerns.

## 2019-07-21 DIAGNOSIS — G2119 Other drug induced secondary parkinsonism: Secondary | ICD-10-CM | POA: Insufficient documentation

## 2019-07-29 ENCOUNTER — Ambulatory Visit: Payer: Medicare Other | Attending: Internal Medicine

## 2019-07-29 DIAGNOSIS — Z23 Encounter for immunization: Secondary | ICD-10-CM | POA: Insufficient documentation

## 2019-07-29 NOTE — Progress Notes (Signed)
   Covid-19 Vaccination Clinic  Name:  RONIT HAUGHN    MRN: [id number] DOB: 09/05/1940  07/29/2019  Mr. Nara was observed post Covid-19 immunization for 15 minutes without incidence. He was provided with Vaccine Information Sheet and instruction to access the V-Safe system.   Mr. Heinzel was instructed to call 911 with any severe reactions post vaccine: Marland Kitchen Difficulty breathing  . Swelling of your face and throat  . A fast heartbeat  . A bad rash all over your body  . Dizziness and weakness    Immunizations Administered    Name Date Dose VIS Date Route   Pfizer COVID-19 Vaccine 07/29/2019 11:01 AM 0.3 mL 05/14/2019 Intramuscular   Manufacturer: [place]   Lot: [id number]   [place]: [id number]

## 2019-08-25 ENCOUNTER — Ambulatory Visit: Payer: Medicare Other | Attending: Internal Medicine

## 2019-08-25 DIAGNOSIS — Z23 Encounter for immunization: Secondary | ICD-10-CM

## 2019-08-25 NOTE — Progress Notes (Signed)
   Covid-19 Vaccination Clinic  Name:  Craig Wilson    MRN: [id number] DOB: Apr 08, 1941  08/25/2019  Mr. [name] was observed post Covid-19 immunization for 15 minutes without incident. He was provided with Vaccine Information Sheet and instruction to access the V-Safe system.   Mr. [name] was instructed to call 911 with any severe reactions post vaccine: Marland Kitchen Difficulty breathing  . Swelling of face and throat  . A fast heartbeat  . A bad rash all over body  . Dizziness and weakness   Immunizations Administered    Name Date Dose VIS Date Route   Pfizer COVID-19 Vaccine 08/25/2019  2:10 PM 0.3 mL 05/14/2019 Intramuscular   Manufacturer: [place]   Lot: [id number]   [place]: [id number]

## 2019-11-03 DIAGNOSIS — I4510 Unspecified right bundle-branch block: Secondary | ICD-10-CM | POA: Insufficient documentation

## 2019-11-03 DIAGNOSIS — G4733 Obstructive sleep apnea (adult) (pediatric): Secondary | ICD-10-CM | POA: Insufficient documentation

## 2019-11-30 ENCOUNTER — Other Ambulatory Visit: Payer: Self-pay | Admitting: Cardiovascular Disease

## 2019-12-05 ENCOUNTER — Encounter: Payer: Self-pay | Admitting: Emergency Medicine

## 2019-12-05 ENCOUNTER — Emergency Department
Admission: EM | Admit: 2019-12-05 | Discharge: 2019-12-05 | Disposition: A | Discharge status: Home / Self Care | Payer: Medicare Other | Attending: Emergency Medicine | Admitting: Emergency Medicine

## 2019-12-05 ENCOUNTER — Other Ambulatory Visit: Payer: Self-pay

## 2019-12-05 DIAGNOSIS — Z79899 Other long term (current) drug therapy: Secondary | ICD-10-CM | POA: Diagnosis not present

## 2019-12-05 DIAGNOSIS — Z87891 Personal history of nicotine dependence: Secondary | ICD-10-CM | POA: Insufficient documentation

## 2019-12-05 DIAGNOSIS — E119 Type 2 diabetes mellitus without complications: Secondary | ICD-10-CM | POA: Insufficient documentation

## 2019-12-05 DIAGNOSIS — T148XXA Other injury of unspecified body region, initial encounter: Secondary | ICD-10-CM

## 2019-12-05 DIAGNOSIS — I10 Essential (primary) hypertension: Secondary | ICD-10-CM | POA: Insufficient documentation

## 2019-12-05 DIAGNOSIS — Z91040 Latex allergy status: Secondary | ICD-10-CM | POA: Diagnosis not present

## 2019-12-05 DIAGNOSIS — G20 Parkinson's disease: Secondary | ICD-10-CM | POA: Diagnosis not present

## 2019-12-05 DIAGNOSIS — Z7984 Long term (current) use of oral hypoglycemic drugs: Secondary | ICD-10-CM | POA: Insufficient documentation

## 2019-12-05 DIAGNOSIS — Z23 Encounter for immunization: Secondary | ICD-10-CM | POA: Insufficient documentation

## 2019-12-05 DIAGNOSIS — L089 Local infection of the skin and subcutaneous tissue, unspecified: Secondary | ICD-10-CM

## 2019-12-05 LAB — GLUCOSE, CAPILLARY: Glucose-Capillary: 126 mg/dL — ABNORMAL HIGH (ref 70–99)

## 2019-12-05 MED ORDER — CEPHALEXIN 500 MG PO CAPS: 500.0000 mg | ORAL_CAPSULE | Freq: Three times a day (TID) | ORAL | 0 refills | Status: AC

## 2019-12-05 MED ORDER — TETANUS-DIPHTH-ACELL PERTUSSIS 5-2.5-18.5 LF-MCG/0.5 IM SUSP
0.5000 mL | Freq: Once | INTRAMUSCULAR | Status: AC
  Administered 2019-12-05: 0.5 mL via INTRAMUSCULAR
  Filled 2019-12-05: qty 0.5

## 2019-12-05 MED ORDER — CEPHALEXIN 500 MG PO CAPS
500.0000 mg | ORAL_CAPSULE | Freq: Once | ORAL | Status: AC
  Administered 2019-12-05: 500 mg via ORAL
  Filled 2019-12-05: qty 1

## 2019-12-05 NOTE — ED Triage Notes (Signed)
Pt has small skin tear/abrasion to left forearm a little over week ago from screen door.  Scant drainage starting today per pt.  No fever.  No streaking up arm.  No pain.  Is diabetic.

## 2019-12-05 NOTE — ED Provider Notes (Addendum)
[facility] Emergency Department Provider Note ____________________________________________  Time seen: 1645  I have reviewed the triage vital signs and the nursing notes.  HISTORY  Chief Complaint  Abrasion   HPI Craig Wilson is a 79 y.o. male presents to the ER today with complaint of skin tear to left forearm.  He reports this occurred 1 week ago after getting his arm caught in the screen door.  He reports over the last week increased redness, swelling and drainage.  He denies warmth or tenderness.  He denies fever, chills, nausea.  He cleansed the area with hydrogen peroxide and has applied Neosporin OTC with minimal relief of symptoms.  He is diabetic.  He reports his last A1c was between 6 and 7.  Per EPIC, he received a tetanus injection 09/2016 but he reports this is inaccurate.  Past Medical History:  Diagnosis Date  . Anemia   . Bipolar disorder ([place])   . Chronic diarrhea   . Colon polyps   . Depression   . Dermatophytosis   . Diabetic neuropathy ([place])   . DJD (degenerative joint disease)   . Dyspnea   . Gout   . Gross hematuria   . HLD (hyperlipidemia)   . HTN (hypertension)   . Hyperkeratosis   . Onychomycosis   . Parkinson disease ([place])   . Prostate cancer ([place])   . Sleep apnea   . Tremor   . Type 2 diabetes mellitus [facility])     Patient Active Problem List   Diagnosis Date Noted  . Other emphysema ([place]) 06/27/2017  . Acquired trigger finger 04/10/2017  . Elevated coronary artery calcium score 03/04/2017  . Sepsis ([place]) 07/01/2016  . CAP (community acquired pneumonia) 07/01/2016  . Influenza A 07/01/2016  . DDD (degenerative disc disease), lumbar 03/11/2016  . Senile purpura ([place]) 03/11/2016  . 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 Mass of left side of neck 10/19/2015  . Thrombocytopenia ([place]) 09/07/2015  . Bilateral edema of lower extremity 05/10/2015  . Bipolar disorder, in partial remission, most recent episode depressed ([place])  05/10/2015  . 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[place]) 05/10/2015  . Edema of lower extremity 05/10/2015  . RLS (restless legs syndrome) 03/29/2015  . Adenomatous polyp 02/24/2015  . Anemia 02/24/2015  . Affective bipolar disorder ([place]) 02/24/2015  . Chronic diarrhea 02/24/2015  . Diabetic neuropathy ([place]) 02/24/2015  . Arthritis, degenerative 02/24/2015  . 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 BP (high blood pressure) 02/24/2015  . CA of prostate ([place]) 02/24/2015  . Incontinence 02/01/2015  . History of prostate cancer 02/01/2015  . Gross hematuria 02/01/2015  . SOB (shortness of breath) 12/16/2014  . Pain in the chest 12/16/2014  . Family history of premature CAD 12/16/2014  . Parkinson's disease ([place]) 12/16/2014  . Essential hypertension 12/16/2014  . Unsteady gait 12/16/2014  . Hyperlipidemia 12/16/2014  . 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 Difficulty in walking 10/10/2014  . Difficulty sleeping 10/10/2014  . Breath shortness 05/06/2014  . Amnesia 02/28/2014  . Diabetes mellitus, type 2 ([place]) 02/15/2014  . Has a tremor 02/01/2006    Past Surgical History:  Procedure Laterality Date  . cataract surgery Bilateral   . COLONOSCOPY W/ BIOPSIES    . FOOT SURGERY    . PROSTATE SURGERY     removed  . status post implantation of artificial urinary sphincter     . TONSILLECTOMY      Prior to Admission medications   Medication Sig Start Date End  Date Taking? Authorizing Provider  atorvastatin (LIPITOR) 40 MG tablet Take 40 mg by mouth daily.    [provider]  carbidopa-levodopa (SINEMET IR) 25-100 MG tablet  01/13/19   [provider]  cephALEXin (KEFLEX) 500 MG capsule Take 1 capsule (500 mg total) by mouth 3 (three) times daily for 10 days. 12/05/19 12/15/19  Jearld Fenton, NP  glucose blood test strip 1 each by Other route as needed for other. Use as instructed    [provider]  lithium carbonate (LITHOBID) 300 MG CR  tablet Take 300 mg by mouth 2 (two) times daily.     [provider]  losartan (COZAAR) 50 MG tablet TAKE 1 TABLET BY MOUTH  DAILY 11/30/19   Minna Merritts, MD  metFORMIN (GLUCOPHAGE) 500 MG tablet Take 500 mg by mouth 2 (two) times daily with a meal.    [provider]  [place] by Does not apply route.    [provider]  mirabegron ER (MYRBETRIQ) 25 MG TB24 tablet Take 1 tablet (25 mg total) by mouth daily. 03/11/19   McGowan, Larene Beach A, PA-C  MITIGARE 0.6 MG CAPS Take 1 capsule by mouth daily.  01/07/16   [provider]  omeprazole (PRILOSEC) 40 MG capsule Take 40 mg by mouth daily.  01/18/16   [provider]  rOPINIRole (REQUIP) 2 MG tablet daily.  01/22/15   [provider]  traZODone (DESYREL) 50 MG tablet 50 mg at bedtime.  12/16/14   [provider]    Allergies Carbidopa w-levodopa, Carbidopa-levodopa, Latex, and Solifenacin  Family History  Problem Relation Age of Onset  . Heart disease Mother   . Heart attack Father 12  . Hypertension Father   . Hyperlipidemia Father   . Kidney cancer Neg Hx   . Prostate cancer Neg Hx   . Bladder Cancer Neg Hx     Social History Social History   Tobacco Use  . Smoking status: Former Smoker    Quit date: 10/24/1999    Years since quitting: 20.1  . Smokeless tobacco: Never Used  Substance Use Topics  . Alcohol use: No    Alcohol/week: 0.0 standard drinks  . Drug use: No    Review of Systems  Constitutional: Negative for fever, chills or body aches. Cardiovascular: Negative for chest pain or chest tightness. Respiratory: Negative for cough or shortness of breath. Skin: Positive for skin tear to left forearm.  Neurological: Negative for focal weakness, tingling or numbness. ____________________________________________  PHYSICAL EXAM:  VITAL SIGNS: ED Triage Vitals  Enc Vitals Group     BP 12/05/19 1636 (!) 149/59     Pulse Rate 12/05/19 1636 69      Resp 12/05/19 1636 16     Temp 12/05/19 1636 98.2 F (36.8 C)     Temp Source 12/05/19 1636 Oral     SpO2 12/05/19 1636 97 %     Weight 12/05/19 1637 158 lb (71.7 kg)     Height 12/05/19 1637 5\' 9"  (1.753 m)     Head Circumference --      Peak Flow --      Pain Score 12/05/19 1637 0     Pain Loc --      Pain Edu? --      Excl. in [place]? --     Constitutional: Alert and oriented. Well appearing and in no distress. Cardiovascular: Normal rate, regular rhythm.  Radial pulse 2+ bilaterally Respiratory: Normal respiratory effort. No  wheezes/rales/rhonchi. Musculoskeletal: Normal flexion, extension and rotation of the left elbow.  Normal flexion, extension and rotation of the left wrist.  No joint swelling noted.  Handgrips equal..  Neurologic:  Normal gait without ataxia. Normal speech and language. No gross focal neurologic deficits are appreciated. Skin: 1.5 cm skin tear noted of the left forearm with surrounding redness. ____________________________________________  INITIAL IMPRESSION / ASSESSMENT AND PLAN / ED COURSE  Infected Skin Tear, Left Forearm:  Wound cleansed with sterile saline by this provider Skin flap secured with Benzoin and steri strip Keflex 500 mg PO x 1 RX for Keflex 500 mg PO TID x 7 days Tdap today ____________________________________________  FINAL CLINICAL IMPRESSION(S) / ED DIAGNOSES  Final diagnoses:  Infected skin tear      Jearld Fenton, NP 12/05/19 1711    Arta Silence, MD 12/05/19 1729    Jearld Fenton, NP 12/05/19 Earley Favor    Arta Silence, MD 12/05/19 2201

## 2019-12-05 NOTE — Discharge Instructions (Addendum)
You were seen today for an infected skin tear.  You received a tetanus injection and were given a dose of antibiotics in the ER.  I have sent a prescription for antibiotics to your pharmacy 3 times daily for the next 7 days.  Please take all medication as prescribed.  Monitor for increased redness, swelling, pain or discharge.  Please follow-up with your PCP if symptoms persist or worsen.

## 2020-01-10 IMAGING — CR DG CHEST 2V
1 series · 2 of 2 positions shown · non-contrast
Comparison: 01/15/2008

CLINICAL DATA: Cough, fever

EXAM:
CHEST - 2 VIEW

[Series 1: dg chest 2 view · 0.14mm/px · 2 of 2 slices shown]
[im 1/2]
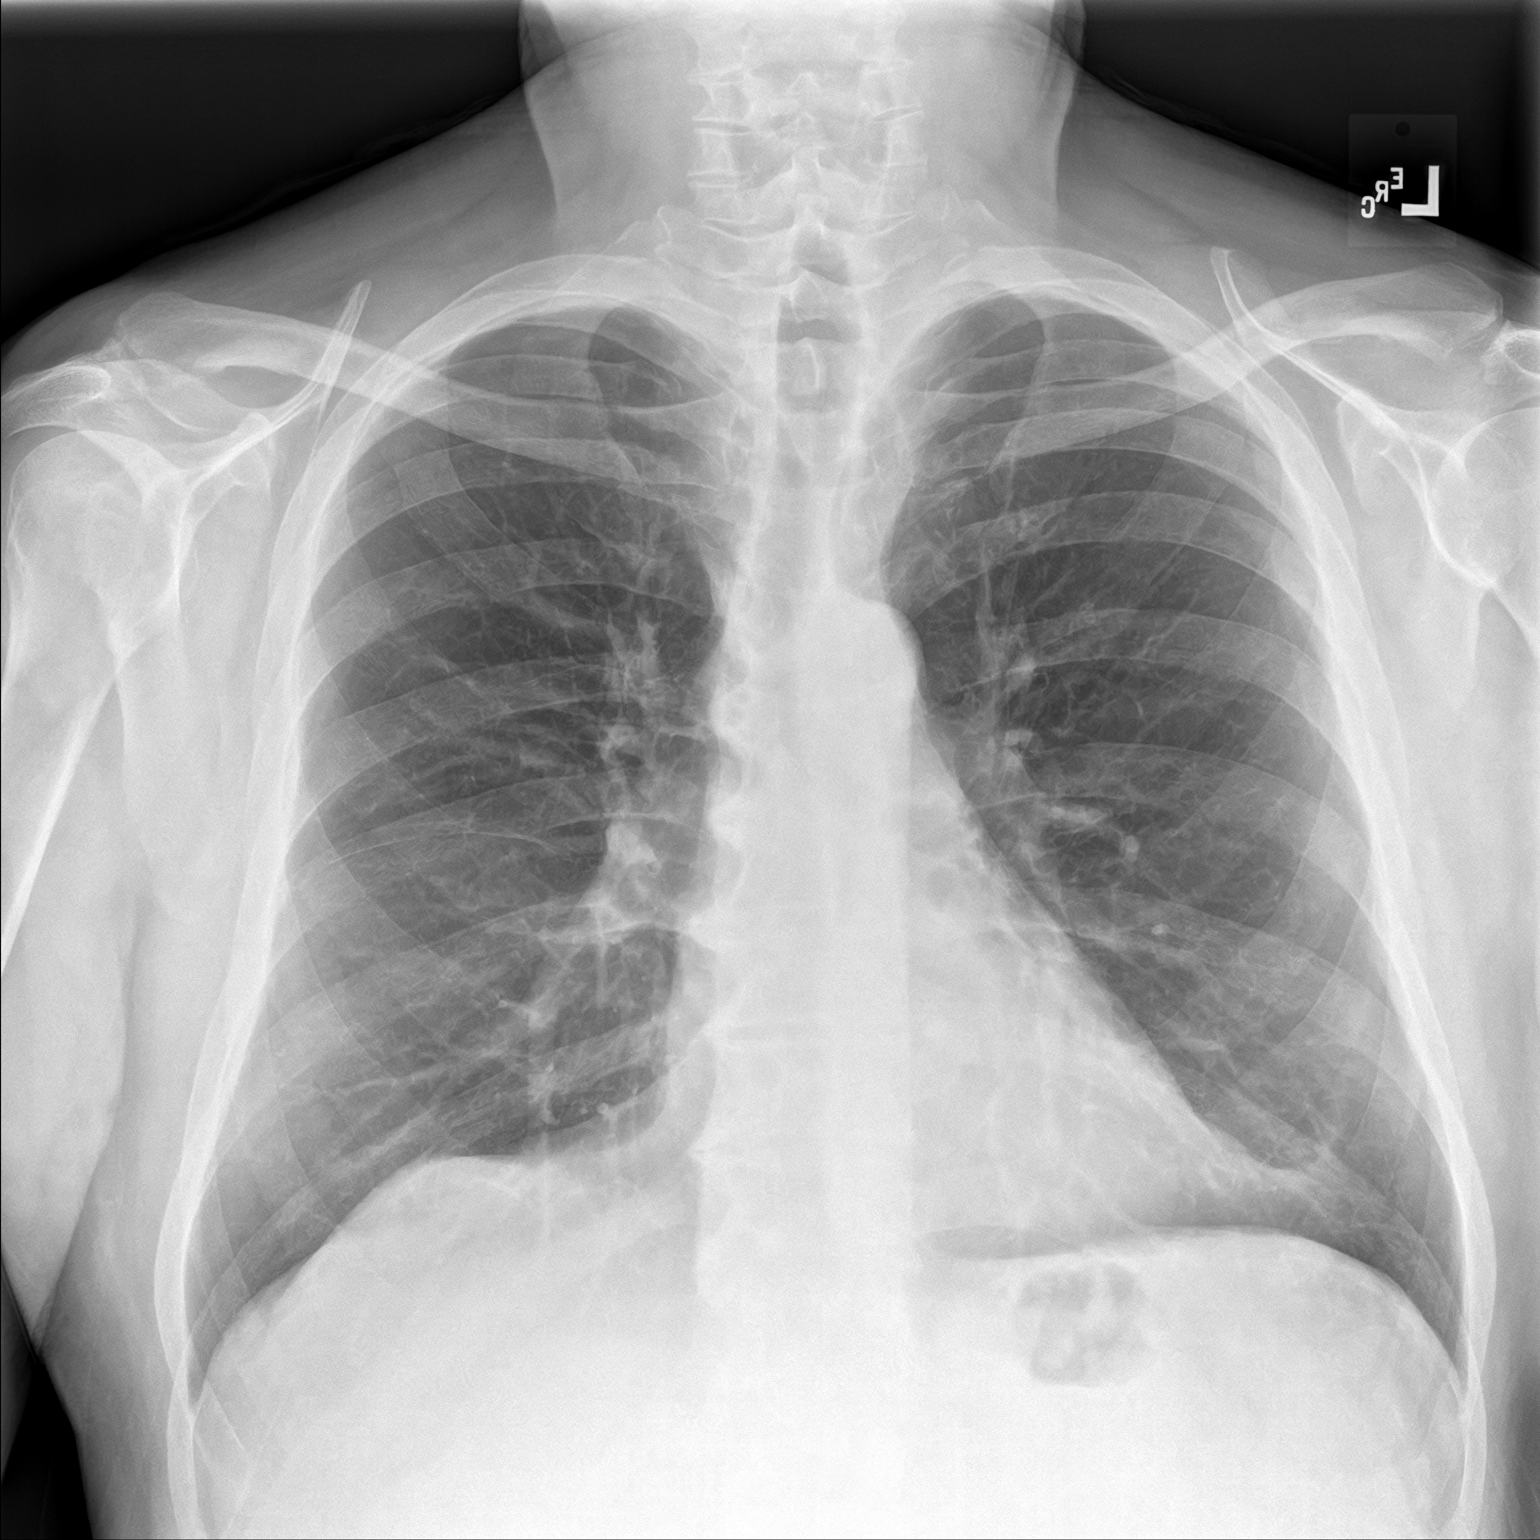
[im 2/2]
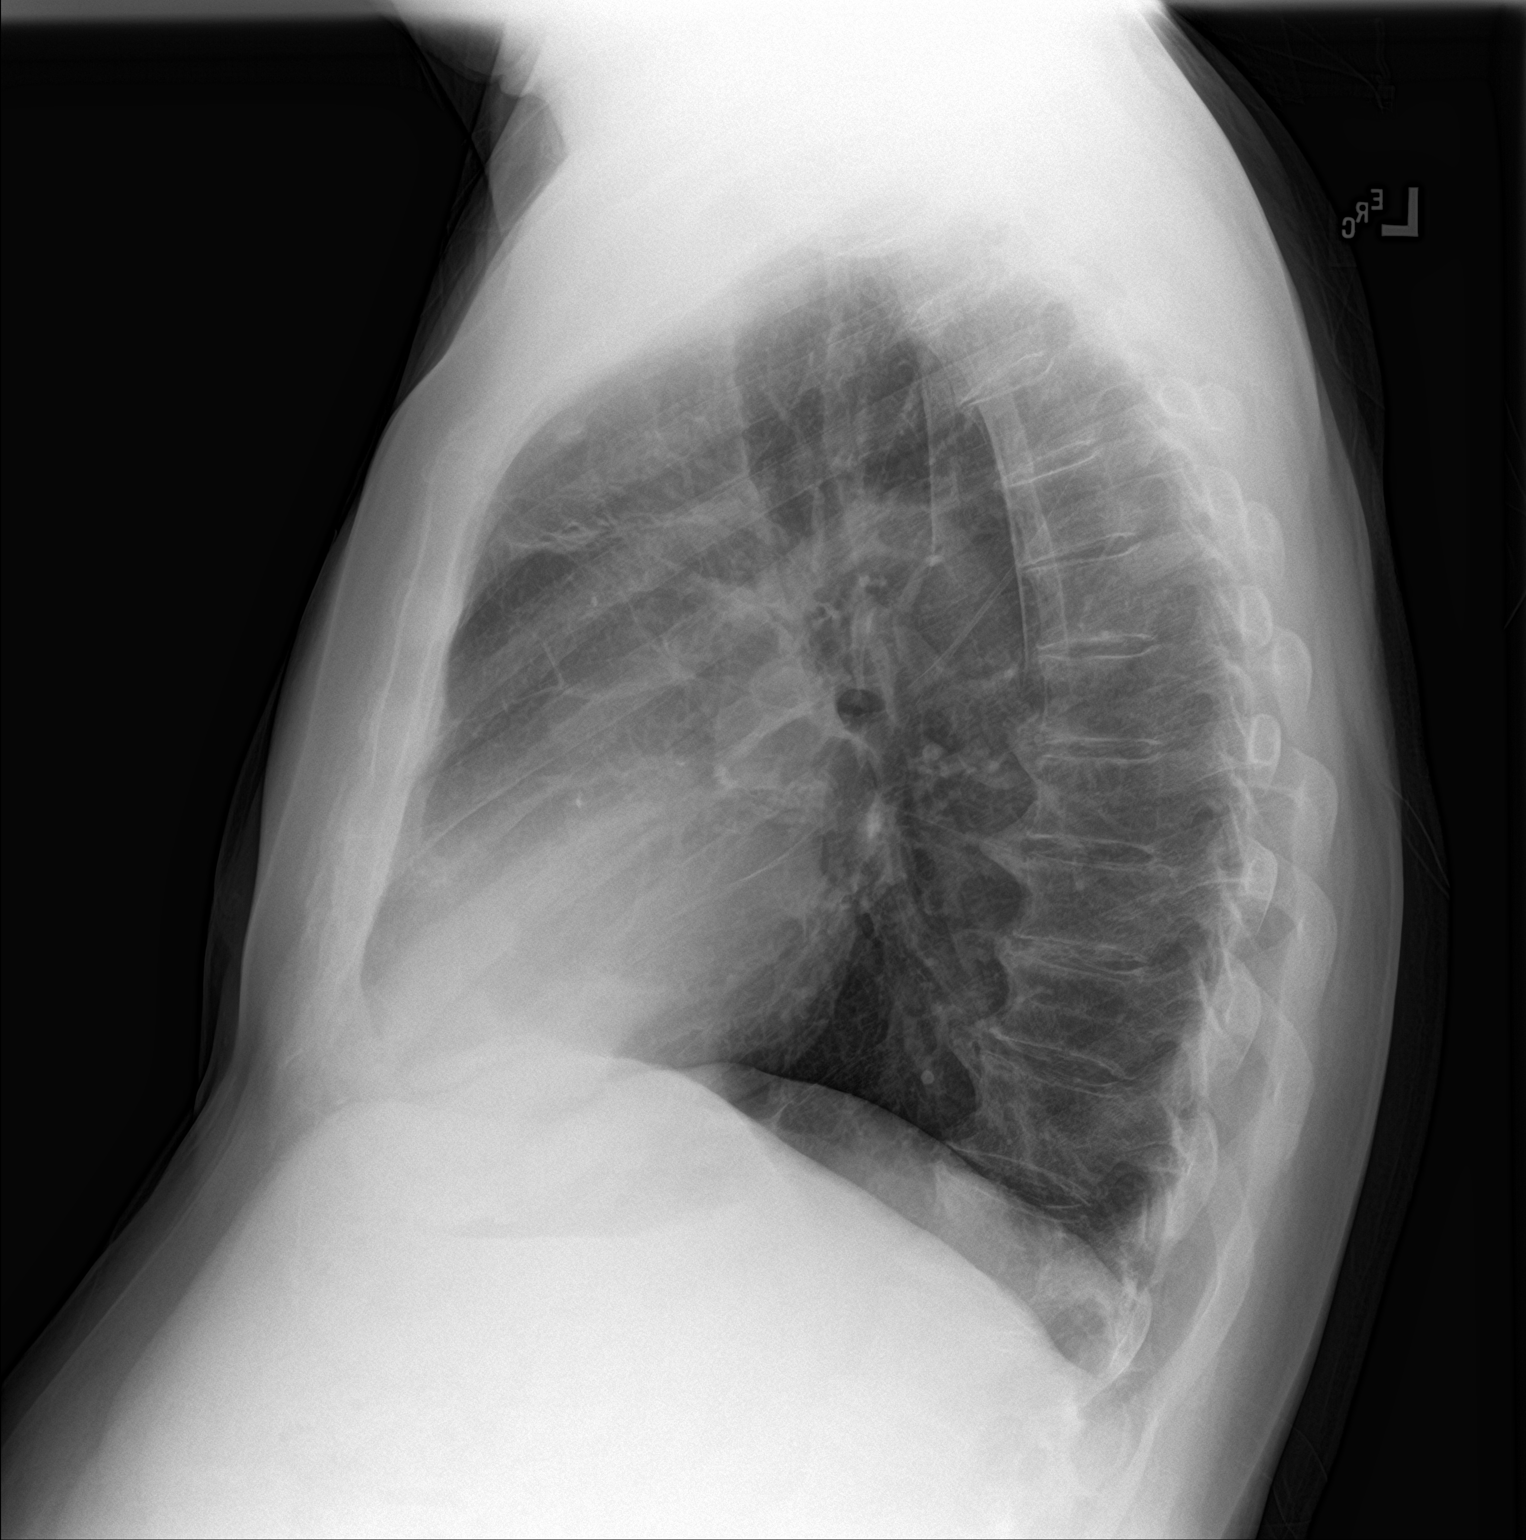

[2 of 2 positions shown; findings below may reference images not displayed]

FINDINGS: Normal heart size and vascularity. Right lung remains clear. Minor
bandlike opacity along the left cardiac border, suspect atelectasis
or scarring. Negative for edema, effusion doors. Trace midline.
Degenerative changes of the spine.
IMPRESSION: Left lower lung atelectasis/scarring.

Diffuse thoracic spondylosis.

## 2020-03-10 ENCOUNTER — Encounter: Payer: Self-pay | Admitting: Urology

## 2020-03-10 ENCOUNTER — Ambulatory Visit: Payer: Medicare Other | Admitting: Urology

## 2020-04-14 ENCOUNTER — Other Ambulatory Visit: Payer: Self-pay

## 2020-04-14 ENCOUNTER — Emergency Department
Admission: EM | Admit: 2020-04-14 | Discharge: 2020-04-14 | Disposition: A | Discharge status: Home / Self Care | Payer: Medicare Other | Attending: Emergency Medicine | Admitting: Emergency Medicine

## 2020-04-14 ENCOUNTER — Encounter: Payer: Self-pay | Admitting: Emergency Medicine

## 2020-04-14 ENCOUNTER — Telehealth: Payer: Self-pay | Admitting: Urology

## 2020-04-14 DIAGNOSIS — R339 Retention of urine, unspecified: Secondary | ICD-10-CM | POA: Diagnosis not present

## 2020-04-14 DIAGNOSIS — R1030 Lower abdominal pain, unspecified: Secondary | ICD-10-CM | POA: Diagnosis not present

## 2020-04-14 DIAGNOSIS — Z7984 Long term (current) use of oral hypoglycemic drugs: Secondary | ICD-10-CM | POA: Insufficient documentation

## 2020-04-14 DIAGNOSIS — E1140 Type 2 diabetes mellitus with diabetic neuropathy, unspecified: Secondary | ICD-10-CM | POA: Diagnosis not present

## 2020-04-14 DIAGNOSIS — I10 Essential (primary) hypertension: Secondary | ICD-10-CM | POA: Diagnosis not present

## 2020-04-14 DIAGNOSIS — Z91040 Latex allergy status: Secondary | ICD-10-CM | POA: Diagnosis not present

## 2020-04-14 DIAGNOSIS — Z79899 Other long term (current) drug therapy: Secondary | ICD-10-CM | POA: Diagnosis not present

## 2020-04-14 DIAGNOSIS — Z8546 Personal history of malignant neoplasm of prostate: Secondary | ICD-10-CM | POA: Insufficient documentation

## 2020-04-14 DIAGNOSIS — Z87891 Personal history of nicotine dependence: Secondary | ICD-10-CM | POA: Insufficient documentation

## 2020-04-14 LAB — BASIC METABOLIC PANEL
Anion gap: 10 (ref 5–15)
BUN: 17 mg/dL (ref 8–23)
CO2: 24 mmol/L (ref 22–32)
Calcium: 10.4 mg/dL — ABNORMAL HIGH (ref 8.9–10.3)
Chloride: 102 mmol/L (ref 98–111)
Creatinine, Ser: 0.9 mg/dL (ref 0.61–1.24)
GFR, Estimated: >60 mL/min (ref >60)
Glucose, Bld: 122 mg/dL — ABNORMAL HIGH (ref 70–99)
Potassium: 4.5 mmol/L (ref 3.5–5.1)
Sodium: 136 mmol/L (ref 135–145)

## 2020-04-14 LAB — URINALYSIS, COMPLETE (UACMP) WITH MICROSCOPIC
Bilirubin Urine: NEGATIVE
Glucose, UA: NEGATIVE mg/dL
Ketones, ur: NEGATIVE mg/dL
Nitrite: NEGATIVE
Protein, ur: 100 mg/dL — AB
Specific Gravity, Urine: 1.005 (ref 1.005–1.030)
pH: 7 (ref 5.0–8.0)

## 2020-04-14 LAB — LITHIUM LEVEL: Lithium Lvl: 1.06 mmol/L (ref 0.60–1.20)

## 2020-04-14 MED ORDER — SODIUM CHLORIDE 0.9 % IV SOLN
1.0000 g | Freq: Once | INTRAVENOUS | Status: AC
  Administered 2020-04-14: 1 g via INTRAVENOUS
  Filled 2020-04-14: qty 10

## 2020-04-14 MED ORDER — LOSARTAN POTASSIUM 50 MG PO TABS
50.0000 mg | ORAL_TABLET | Freq: Every day | ORAL | Status: DC
  Administered 2020-04-14: 50 mg via ORAL
  Filled 2020-04-14: qty 1

## 2020-04-14 MED ORDER — FENTANYL CITRATE (PF) 100 MCG/2ML IJ SOLN
50.0000 ug | Freq: Once | INTRAMUSCULAR | Status: DC
  Filled 2020-04-14: qty 2

## 2020-04-14 MED ORDER — SULFAMETHOXAZOLE-TRIMETHOPRIM 800-160 MG PO TABS: 1.0000 | ORAL_TABLET | Freq: Two times a day (BID) | ORAL | 0 refills | Status: AC

## 2020-04-14 NOTE — Telephone Encounter (Signed)
This patient was seen in the emergency room Friday evening with difficult Foley catheter/Foley trauma.  Please add him to my clinic on Tuesday for cystoscopy.   Hollice Espy, MD

## 2020-04-14 NOTE — ED Notes (Signed)
Called pharm to notify pyxis out of rocephin 1g. Pharm states they'll tube it soon.

## 2020-04-14 NOTE — ED Provider Notes (Addendum)
[facility] Emergency Department Provider Note  ____________________________________________   First MD Initiated Contact with Patient 04/14/20 1543     (approximate)  I have reviewed the triage vital signs and the nursing notes.   HISTORY  Chief Complaint Urinary Retention   HPI Craig Wilson is a 79 y.o. male with past medical history of HTN, HDL, DM, DJD, depression, bipolar disorder, anemia, gout, and stress incontinence followed by [facility] urology who presents for assessment of urinary retention lower abdominal pain and some abdominal distention since midnight.  Patient states he was able to get a few drops of urine out this morning but his pain has gotten worse in his lower abdomen got more distended over the last 12 hours.  He states he has had episodes like this in the past requiring Foley placement.  He denies any other acute symptoms including fevers, chills, cough, chest pain, vomiting, diarrhea, blood in his urine, burning with urination, back pain, rash, extremity pain, or other acute complaints.         Past Medical History:  Diagnosis Date  . Anemia   . Bipolar disorder ([place])   . Chronic diarrhea   . Colon polyps   . Depression   . Dermatophytosis   . Diabetic neuropathy ([place])   . DJD (degenerative joint disease)   . Dyspnea   . Gout   . Gross hematuria   . HLD (hyperlipidemia)   . HTN (hypertension)   . Hyperkeratosis   . Onychomycosis   . Parkinson disease ([place])   . Prostate cancer ([place])   . Sleep apnea   . Tremor   . Type 2 diabetes mellitus [facility])     Patient Active Problem List   Diagnosis Date Noted  . Other emphysema ([place]) 06/27/2017  . Acquired trigger finger 04/10/2017  . Elevated coronary artery calcium score 03/04/2017  . Sepsis ([place]) 07/01/2016  . CAP (community acquired pneumonia) 07/01/2016  . Influenza A 07/01/2016  . DDD (degenerative disc disease), lumbar 03/11/2016  . Senile purpura ([place]) 03/11/2016    . 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 Mass of left side of neck 10/19/2015  . Thrombocytopenia ([place]) 09/07/2015  . Bilateral edema of lower extremity 05/10/2015  . Bipolar disorder, in partial remission, most recent episode depressed ([place]) 05/10/2015  . 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[place]) 05/10/2015  . Edema of lower extremity 05/10/2015  . RLS (restless legs syndrome) 03/29/2015  . Adenomatous polyp 02/24/2015  . Anemia 02/24/2015  . Affective bipolar disorder ([place]) 02/24/2015  . Chronic diarrhea 02/24/2015  . Diabetic neuropathy ([place]) 02/24/2015  . Arthritis, degenerative 02/24/2015  . 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 BP (high blood pressure) 02/24/2015  . CA of prostate ([place]) 02/24/2015  . Incontinence 02/01/2015  . History of prostate cancer 02/01/2015  . Gross hematuria 02/01/2015  . SOB (shortness of breath) 12/16/2014  . Pain in the chest 12/16/2014  . Family history of premature CAD 12/16/2014  . Parkinson's disease ([place]) 12/16/2014  . Essential hypertension 12/16/2014  . Unsteady gait 12/16/2014  . Hyperlipidemia 12/16/2014  . 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 Difficulty in walking 10/10/2014  . Difficulty sleeping 10/10/2014  . Breath shortness 05/06/2014  . Amnesia 02/28/2014  . Diabetes mellitus, type 2 ([place]) 02/15/2014  . Has a tremor 02/01/2006    Past Surgical History:  Procedure Laterality Date  . cataract surgery Bilateral   . COLONOSCOPY W/ BIOPSIES    . FOOT SURGERY    . PROSTATE  SURGERY     removed  . status post implantation of artificial urinary sphincter     . TONSILLECTOMY      Prior to Admission medications   Medication Sig Start Date End Date Taking? Authorizing Provider  atorvastatin (LIPITOR) 40 MG tablet Take 40 mg by mouth daily.    [provider]  carbidopa-levodopa (SINEMET IR) 25-100 MG tablet  01/13/19   [provider]  glucose blood test strip 1 each by Other route as needed for other.  Use as instructed    [provider]  lithium carbonate (LITHOBID) 300 MG CR tablet Take 300 mg by mouth 2 (two) times daily.     [provider]  losartan (COZAAR) 50 MG tablet TAKE 1 TABLET BY MOUTH  DAILY 11/30/19   Minna Merritts, MD  metFORMIN (GLUCOPHAGE) 500 MG tablet Take 500 mg by mouth 2 (two) times daily with a meal.    [provider]  [place] by Does not apply route.    [provider]  mirabegron ER (MYRBETRIQ) 25 MG TB24 tablet Take 1 tablet (25 mg total) by mouth daily. 03/11/19   McGowan, Larene Beach A, PA-C  MITIGARE 0.6 MG CAPS Take 1 capsule by mouth daily.  01/07/16   [provider]  omeprazole (PRILOSEC) 40 MG capsule Take 40 mg by mouth daily.  01/18/16   [provider]  rOPINIRole (REQUIP) 2 MG tablet daily.  01/22/15   [provider]  sulfamethoxazole-trimethoprim (BACTRIM DS) 800-160 MG tablet Take 1 tablet by mouth 2 (two) times daily for 7 days. 04/14/20 04/21/20  Lucrezia Starch, MD  traZODone (DESYREL) 50 MG tablet 50 mg at bedtime.  12/16/14   [provider]    Allergies Carbidopa w-levodopa, Carbidopa-levodopa, Latex, and Solifenacin  Family History  Problem Relation Age of Onset  . Heart disease Mother   . Heart attack Father 51  . Hypertension Father   . Hyperlipidemia Father   . Kidney cancer Neg Hx   . Prostate cancer Neg Hx   . Bladder Cancer Neg Hx     Social History Social History   Tobacco Use  . Smoking status: Former Smoker    Quit date: 10/24/1999    Years since quitting: 20.4  . Smokeless tobacco: Never Used  Substance Use Topics  . Alcohol use: No    Alcohol/week: 0.0 standard drinks  . Drug use: No    Review of Systems  Review of Systems  Constitutional: Negative for chills and fever.  HENT: Negative for sore throat.   Eyes: Negative for pain.  Respiratory: Negative for cough and stridor.   Cardiovascular: Negative for chest pain.    Gastrointestinal: Positive for abdominal pain. Negative for vomiting.  Skin: Negative for rash.  Neurological: Negative for seizures, loss of consciousness and headaches.  Psychiatric/Behavioral: Negative for suicidal ideas.  All other systems reviewed and are negative.     ____________________________________________   PHYSICAL EXAM:  VITAL SIGNS: ED Triage Vitals  Enc Vitals Group     BP 04/14/20 1332 (!) 205/87     Pulse Rate 04/14/20 1328 95     Resp 04/14/20 1328 18     Temp 04/14/20 1328 98.6 F (37 C)     Temp Source 04/14/20 1328 Oral     SpO2 04/14/20 1328 96 %     Weight 04/14/20 1329 155 lb (70.3 kg)     Height 04/14/20 1329 5\' 9"  (1.753 m)  Head Circumference --      Peak Flow --      Pain Score 04/14/20 1329 10     Pain Loc --      Pain Edu? --      Excl. in [place]? --    Vitals:   04/14/20 1512 04/14/20 1934  BP: (!) 206/77 (!) 161/77  Pulse: 96 99  Resp: 16 18  Temp:    SpO2: 97% 98%   Physical Exam Vitals and nursing note reviewed.  Constitutional:      Appearance: He is well-developed.  HENT:     Head: Normocephalic and atraumatic.     Right Ear: External ear normal.     Left Ear: External ear normal.     Nose: Nose normal.     Mouth/Throat:     Mouth: Mucous membranes are moist.  Eyes:     Conjunctiva/sclera: Conjunctivae normal.  Cardiovascular:     Rate and Rhythm: Normal rate and regular rhythm.     Heart sounds: No murmur heard.   Pulmonary:     Effort: Pulmonary effort is normal. No respiratory distress.     Breath sounds: Normal breath sounds.  Abdominal:     Palpations: Abdomen is soft.     Tenderness: There is abdominal tenderness in the suprapubic area. There is no right CVA tenderness or left CVA tenderness.  Musculoskeletal:     Cervical back: Neck supple.  Skin:    General: Skin is warm and dry.  Neurological:     Mental Status: He is alert and oriented to person, place, and time.  Psychiatric:        Mood and  Affect: Mood normal.      ____________________________________________   LABS (all labs ordered are listed, but only abnormal results are displayed)  Labs Reviewed  URINALYSIS, COMPLETE (UACMP) WITH MICROSCOPIC - Abnormal; Notable for the following components:      Result Value   Color, Urine AMBER (*)    APPearance HAZY (*)    Hgb urine dipstick LARGE (*)    Protein, ur 100 (*)    Leukocytes,Ua MODERATE (*)    Bacteria, UA RARE (*)    Non Squamous Epithelial PRESENT (*)    All other components within normal limits  BASIC METABOLIC PANEL - Abnormal; Notable for the following components:   Glucose, Bld 122 (*)    Calcium 10.4 (*)    All other components within normal limits  URINE CULTURE  LITHIUM LEVEL   ____________________________________________   ____________________________________________   PROCEDURES  Procedure(s) performed (including Critical Care):  Procedures   ____________________________________________   INITIAL IMPRESSION / ASSESSMENT AND PLAN / ED COURSE        Patient presents with [place] to history exam for assessment of urinary retention just with some lower abdominal pain discomfort.  On arrival patient is hypertensive with a BP of 205/87 otherwise stable vital signs on room air.  Exam as above.  Initial attempt to place Foley was unsuccessful did consult on-call urologist Dr. Hollice Espy.  Dr. Caryl Pina was able to place a Foley and recommended a dose of empiric antibiotics and Rx for Bactrim given risk of infection associated with trauma from attempted Foley placement.  Unclear etiology for patient's obstruction at this time.  However he is not otherwise systemically ill I have low suspicion for sepsis or other acute intra-abdominal pathology at this time.  BMP shows no significant ocular metabolic derangements and lithium is therapeutic.  UA does appear consistent  with cystitis.  Urine culture sent.  Patient's blood pressure was noted significantly  improved after placement of Foley catheter.  In addition he stated his abdominal pain is significantly improved.  Discharge stable condition.  Strict return precautions advised discussed.  Rx written for Bactrim per urology recommendation.    ____________________________________________   FINAL CLINICAL IMPRESSION(S) / ED DIAGNOSES  Final diagnoses:  Urinary retention    Medications  cefTRIAXone (ROCEPHIN) 1 g in sodium chloride 0.9 % 100 mL IVPB (has no administration in time range)     ED Discharge Orders         Ordered    sulfamethoxazole-trimethoprim (BACTRIM DS) 800-160 MG tablet  2 times daily        04/14/20 1912           Note:  This document was prepared using Dragon voice recognition software and may include unintentional dictation errors.   Lucrezia Starch, MD 04/14/20 Lattie Corns    Lucrezia Starch, MD 04/14/20 [phone]

## 2020-04-14 NOTE — ED Notes (Signed)
Unsuccessful catheter attempt by MD. Specialist to be called.

## 2020-04-14 NOTE — ED Notes (Signed)
Urologist, DR. Erlene Quan, at bedside.

## 2020-04-14 NOTE — ED Notes (Signed)
Unsuccessful catheter attempt by RN.  MD notified.

## 2020-04-14 NOTE — ED Triage Notes (Addendum)
Pt via POV from home. Pt states that he having scrotal and groin pain for the last 24 hours. Pt states he is having urinary frequency and unable to empty bladder completely, pt states that he was up all night trying to void but only drops would come out. Pt states it is uncomfortable to sit down. Pt states that he has had this issue in the past with this. Pt is A&Ox4 and NAD. Pt was suppose to follow up with Urologist and was unable to get an appt at this time. Pt has a hx of prostate surgery and artificial urinary sphincter

## 2020-04-14 NOTE — Consult Note (Signed)
Urology Consult  I have been asked to see the patient by Dr. Tamala Julian, for evaluation and management of difficult Foley.  Chief Complaint: Urinary retention  History of Present Illness: Craig Wilson is a 79 y.o. 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  He reports that this is happened to him before.  Ultimately, he was able to drink enough water and ultimately void independently.  He tried doing this but was unsuccessful and presented to the emergency room.  Bladder scan indicated he had over 800 cc in his bladder.  Foley catheter was attempted x2 by the nurse and x2 by the physician of various sizes including daily Foley's unsuccessfully.  His artificial urinary sphincter was not deactivated upon these attempts.  He denies any dysuria or gross hematuria.  No fevers or chills.  Notably, this is a patient of ours followed both by [place] and [place] urology.  He underwent hematuria evaluation just 2 years ago at which time it did show the cuff cycling although the patient reports that he has severe incontinence despite this and was told that it does not function any further.  No other pathology was identified at that time.  Cuff was deactivated permanently at which time the patient began to leak blood-tinged urine.  He was stood up, given a urinal and advised to attempt to stand.  He was unsuccessful in doing so.  He continued complaint of inability to void and discomfort.  He did leak about 50 cc but not much more than this.  Past Medical History:  Diagnosis Date  . Anemia   . Bipolar disorder ([place])   . Chronic diarrhea   . Colon polyps   . Depression   . Dermatophytosis   . Diabetic neuropathy ([employer])   . DJD (degenerative joint  disease)   . Dyspnea   . Gout   . Gross hematuria   . HLD (hyperlipidemia)   . HTN (hypertension)   . Hyperkeratosis   . Onychomycosis   . Parkinson disease ([place])   . Prostate cancer ([place])   . Sleep apnea   . Tremor   . Type 2 diabetes mellitus ([place])     Past Surgical History:  Procedure Laterality Date  . cataract surgery Bilateral   . COLONOSCOPY W/ BIOPSIES    . FOOT SURGERY    . PROSTATE SURGERY     removed  . status post implantation of artificial urinary sphincter     . TONSILLECTOMY      Home Medications:  No outpatient medications have been marked as taking for the 04/14/20 encounter [facility] Encounter).    Allergies:  Allergies  Allergen Reactions  . Carbidopa W-Levodopa Other (See Comments)    irrational behavior , felt zombie like  . Carbidopa-Levodopa Other (See Comments)    irrational behavior , felt zombie like  . Latex Other (See Comments)    Internal such as catheters. Latex gloves are okay  . Solifenacin Other (See Comments)    Family History  Problem Relation Age of Onset  . Heart disease Mother   . Heart attack Father 29  . Hypertension Father   . Hyperlipidemia Father   . Kidney cancer Neg Hx   . Prostate cancer Neg Hx   . Bladder Cancer  Neg Hx     Social History:  reports that he quit smoking about 20 years ago. He has never used smokeless tobacco. He reports that he does not drink alcohol and does not use drugs.  ROS: A complete review of systems was performed.  All systems are negative except for pertinent findings as noted.  Physical Exam:  Vital signs in last 24 hours: Temp:  [98.6 F (37 C)] 98.6 F (37 C) (11/12 1328) Pulse Rate:  [95-96] 96 (11/12 1512) Resp:  [16-18] 16 (11/12 1512) BP: (205-206)/(77-87) 206/77 (11/12 1512) SpO2:  [96 %-97 %] 97 % (11/12 1512) Weight:  [70.3 kg] 70.3 kg (11/12 1329) Constitutional:  Alert and oriented, No acute distress.  Wife at bedside.  Hard of hearing. HEENT: [place] AT, moist mucus  membranes.  Trachea midline, no masses Cardiovascular: Regular rate and rhythm, no clubbing, cyanosis, or edema. Respiratory: Normal respiratory effort, lungs clear bilaterally GI: Abdomen is soft, nontender, suprapubic area is tender to deep palpation and full consistent with urinary retention GU: Circumcised phallus.  Artificial urinary sphincter pump in right hemiscrotum.  The pump was permanently deactivated and upon doing so, the patient began to leak blood-tinged urine. Skin: No rashes, bruises or suspicious lesions Neurologic: Grossly intact, no focal deficits, moving all 4 extremities Psychiatric: Normal mood and affect   Procedure: Patient was prepped and draped in the standard sterile fashion.  I attempted very gently to place a 14 French Foley catheter using standard sterile technique after the cuff was permanently deactivated.  I was unsuccessful in doing so.  There was a small amount of blood in the tip of the catheter from previous attempts.  I then reprepped and draped the patient.  This time, a flexible 44 French cystoscope was advanced per urethra.  The bulbar urethra appeared relatively normal but there is an area of ragged bleeding near the bladder neck.  I was unable to pass the scope beyond this or assess much due to poor visualization.  No obvious cuff erosion could be seen although visibility was somewhat suboptimal.  I was able to place a wire into the bladder successfully.  I then converted a 12 [place] council tip catheter into a coud tip and advance it over the wire.  There was some resistance at the bladder neck and or cuff but ultimately this did pass relatively easily.  The balloon was inflated.  There is return of clear yellow urine.  Catheter was secured to the left thigh with a leg bag.  Impression/plan:  79 year old male with urinary retention.  Unfortunately, cuff was not deactivated prior to foley attempts and he had multiple Foley catheter times which were  unsuccessful with return of blood.  It is unclear what the etiology of his retention is, whether he had cuff erosion, bladder neck contracture, or combination.    We discussed tonight that he has high risk for cuff erosion and/or infection in the setting of trauma.  Bladder is now draining well and is much more comfortable.  He will receive a dose of IV antibiotics periprocedurally this evening we will send him on Bactrim DS twice daily as a precaution.  I would like him to keep the Foley catheter until early next week.  We will plan for an office cystoscopy to reassess the area of poor visualization.  If there is evidence of cuff erosion, he may require explantation.  Warning symptoms reviewed.  04/14/2020, 6:57 PM  Craig Espy,  MD  I spent 80 total  minutes on the day of the encounter including pre-visit review of the medical record, face-to-face time with the patient, and post visit ordering of labs/imaging/tests.

## 2020-04-16 LAB — URINE CULTURE: Culture: NO GROWTH

## 2020-04-17 ENCOUNTER — Ambulatory Visit: Payer: Self-pay | Admitting: Urology

## 2020-04-17 NOTE — Telephone Encounter (Signed)
Spoke to patient, updated telephone number and added patient to Dr. Cherrie Gauze schedule on Tuesday, 11/16.

## 2020-04-17 NOTE — Telephone Encounter (Signed)
Attempted to reach patient number no longer in service. Wife's number is also no longer correct.

## 2020-04-18 ENCOUNTER — Other Ambulatory Visit: Payer: Self-pay

## 2020-04-18 ENCOUNTER — Ambulatory Visit (INDEPENDENT_AMBULATORY_CARE_PROVIDER_SITE_OTHER): Payer: Medicare Other | Admitting: Urology

## 2020-04-18 VITALS — BP 150/76 | HR 80

## 2020-04-18 DIAGNOSIS — N393 Stress incontinence (female) (male): Secondary | ICD-10-CM

## 2020-04-18 DIAGNOSIS — R339 Retention of urine, unspecified: Secondary | ICD-10-CM

## 2020-04-18 DIAGNOSIS — N320 Bladder-neck obstruction: Secondary | ICD-10-CM

## 2020-04-18 DIAGNOSIS — T839XXS Unspecified complication of genitourinary prosthetic device, implant and graft, sequela: Secondary | ICD-10-CM

## 2020-04-18 LAB — BLADDER SCAN AMB NON-IMAGING: Scan Result: 68

## 2020-04-18 NOTE — Progress Notes (Signed)
   04/18/20  CC:  Chief Complaint  Patient presents with  . Cysto    HPI: 79 year old male who presents today for further evaluation of urinary retention.  He was seen and evaluated in the emergency room over the weekend with difficulty urinating.  PVR by bladder scan was greater than 800 cc.  The ER was not familiar with the artificial urinary sphincter and failed to deactivate the cuff prior to multiple Foley catheter attempts with trauma and bleeding.  Ultimately, I was able to deactivate the cuff in place Foley catheter, unclear whether or not there was any erosion or damage to the AUS as visualization was relatively suboptimal.  Only able to get in 12 [place] catheter.  Notably, the patient was being followed by [facility] urology, Dr. Terance Hart and was in fact scheduled for AUS replacement due to ongoing severe urge incontinence.  To been scheduled for urodynamics prior to AUS replacement surgery, however, the patient ultimately did not follow through with this.  In the interim, has been seeing urology with worsening Parkinson's disease initially diagnosed in 2011 with some dementia progressing.  At the time of catheter trauma, he did receive a dose of IV antibiotics and was discharged on Bactrim.  Vitals:   04/18/20 1039  BP: (!) 150/76  Pulse: 80    NED. A&Ox3.   No respiratory distress   Abd soft, NT, ND Normal phallus with bilateral descended testicles  Cystoscopy Procedure Note  Patient identification was confirmed, informed consent was obtained, and patient was prepped using Betadine solution.  Lidocaine jelly was administered per urethral meatus.    Care was taken to ensure AUS cuff deflated (permanetly) with dimple noted in scrotal pump   Pre-Procedure: - Inspection reveals a normal caliber ureteral meatus.  Procedure: The flexible cystoscope was introduced without difficulty - No urethral strictures/lesions are present. - Surgically absent prostate - Shaggy  necrosis at bladder neck, small friable tissue -No obvious urethral cuff erosion noted today  Attempted to cycle cuff, did not seem to inflate.  Permanently deactivated again today   Retroflexion unremarkable   Post-Procedure: - Patient tolerated the procedure well   Results for orders placed or performed in visit on 04/18/20  BLADDER SCAN AMB NON-IMAGING  Result Value Ref Range   Scan Result 68 ml    Assesment/ Plan:  1. Urinary incontinence, male, stress S/p AUS, not functioning Considering replacement at [facility] but did not follow through - BLADDER SCAN AMB NON-IMAGING  2. Urinary retention ? Bladder neck contracture or activated / malfunctioning cuff Voided to completion today, PVR minimal  3. Bladder neck contracture No obvious erosion ~14 Fr Possible etiology of retention  4. Problem with Foley catheter, sequela AS above Complete abx course High risk for cuff erosion/ infection after trauma  F/u [place] in 1 month to assess voiding symptoms / ensure bladder emptying (PVR)  Hollice Espy, MD

## 2020-04-18 NOTE — Patient Instructions (Signed)
    [email or web address]  The [employer]ner Incontinence Clamp is an external medical device used to control urine leakage by compressing the urethra and preventing the flow of urine   Lets you maintain your active lifestyle Cost effective, saving you lots of money on adult incontinence briefs Can be worn during any activity Ergonomic design promotes confidence and provides all-day comfort    Step 3 Latch the incontinence clamp to compress the  urethra at the level that's comfortable to you         Step 2 Place your penis between the silicone rubber pads with the  incontinence clamp about halfway down the shaft of your penis Step 1 Open the incontinence clamp by  releasing the catch and lifting up the top part  

## 2020-04-19 ENCOUNTER — Encounter: Payer: Self-pay | Admitting: Urology

## 2020-04-19 ENCOUNTER — Telehealth: Payer: Self-pay

## 2020-04-19 ENCOUNTER — Ambulatory Visit (INDEPENDENT_AMBULATORY_CARE_PROVIDER_SITE_OTHER): Payer: Medicare Other | Admitting: Urology

## 2020-04-19 VITALS — BP 195/88 | HR 76

## 2020-04-19 DIAGNOSIS — N320 Bladder-neck obstruction: Secondary | ICD-10-CM

## 2020-04-19 DIAGNOSIS — R339 Retention of urine, unspecified: Secondary | ICD-10-CM

## 2020-04-19 LAB — BLADDER SCAN AMB NON-IMAGING: Scan Result: 903

## 2020-04-19 NOTE — Progress Notes (Signed)
   04/19/2020 8:13 AM   Meriam Sprague Hen[place] 02-15-1941 [id number]  Reason for visit: Urinary retention  HPI: Patient of Dr. Cherrie Gauze with AUS that has been malfunctioning, was most recently scoped by Dr. Erlene Quan yesterday.  His AUS was deactivated at that time and cystoscopy showed appropriate opening of the cuff, however there was a bladder neck contracture.  He reportedly was leaking with a PVR of 0 during the day yesterday.  He came to clinic this morning with inability to void and lower abdominal pressure, and bladder scan showed 900 mL.  On exam, the cuff appeared deactivated, but I re-cycled and deactivated it again.  He was still unable to void.  He was prepped and draped in standard sterile fashion and a flexible cystoscope was used to intubate the urethra.  A normal-appearing urethra was followed proximally towards the bladder.  The cuff was widely open, however there was a bladder neck contracture with only a pinpoint opening.  A sensor wire was advanced through the opening.  I was able to advance a 64 [place] council Foley over the wire into the bladder with return of clear yellow urine.  I suspect his bladder neck contracture is the etiology of his recurrent urinary retention.  Will discuss with Dr. Erlene Quan to determine follow-up plan, anticipate referral to [facility] or [facility] for consideration of bladder neck contracture repair and AUS revision.  Maintain Foley Complete course of antibiotics previously prescribed  Billey Co, MD  [place] [address] [place], [place] [address] [phone]

## 2020-04-19 NOTE — Telephone Encounter (Signed)
Spoke with patient's wife and she was notified of this message and verbalized understanding

## 2020-04-19 NOTE — Telephone Encounter (Signed)
-----   Message from Billey Co, MD sent at 04/19/2020 12:03 PM EST ----- Regarding: [facility] follow up Dr. Erlene Quan would like this patient to follow-up ASAP with his urologist Dr. 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  He currently has a catheter in place.  Please have the patient call [facility] to set up an appointment ASAP since he has a catheter in for retention with a sphincter.  Nickolas Madrid, MD 04/19/2020

## 2020-04-22 ENCOUNTER — Other Ambulatory Visit: Payer: Self-pay

## 2020-04-22 ENCOUNTER — Emergency Department
Admission: EM | Admit: 2020-04-22 | Discharge: 2020-04-22 | Disposition: A | Discharge status: Home / Self Care | Payer: Medicare Other | Attending: Emergency Medicine | Admitting: Emergency Medicine

## 2020-04-22 DIAGNOSIS — I10 Essential (primary) hypertension: Secondary | ICD-10-CM | POA: Insufficient documentation

## 2020-04-22 DIAGNOSIS — Z91040 Latex allergy status: Secondary | ICD-10-CM | POA: Insufficient documentation

## 2020-04-22 DIAGNOSIS — Z7984 Long term (current) use of oral hypoglycemic drugs: Secondary | ICD-10-CM | POA: Diagnosis not present

## 2020-04-22 DIAGNOSIS — E1140 Type 2 diabetes mellitus with diabetic neuropathy, unspecified: Secondary | ICD-10-CM | POA: Insufficient documentation

## 2020-04-22 DIAGNOSIS — Z8546 Personal history of malignant neoplasm of prostate: Secondary | ICD-10-CM | POA: Diagnosis not present

## 2020-04-22 DIAGNOSIS — Z87891 Personal history of nicotine dependence: Secondary | ICD-10-CM | POA: Insufficient documentation

## 2020-04-22 DIAGNOSIS — R339 Retention of urine, unspecified: Secondary | ICD-10-CM | POA: Diagnosis not present

## 2020-04-22 LAB — CBC WITH DIFFERENTIAL/PLATELET
Abs Immature Granulocytes: 0.02 10*3/uL (ref 0.00–0.07)
Basophils Absolute: 0 10*3/uL (ref 0.0–0.1)
Basophils Relative: 1 %
Eosinophils Absolute: 0.2 10*3/uL (ref 0.0–0.5)
Eosinophils Relative: 2 %
HCT: 35 % — ABNORMAL LOW (ref 39.0–52.0)
Hemoglobin: 11.4 g/dL — ABNORMAL LOW (ref 13.0–17.0)
Immature Granulocytes: 0 %
Lymphocytes Relative: 7 %
Lymphs Abs: 0.4 10*3/uL — ABNORMAL LOW (ref 0.7–4.0)
MCH: 33.1 pg (ref 26.0–34.0)
MCHC: 32.6 g/dL (ref 30.0–36.0)
MCV: 101.7 fL — ABNORMAL HIGH (ref 80.0–100.0)
Monocytes Absolute: 0.2 10*3/uL (ref 0.1–1.0)
Monocytes Relative: 3 %
Neutro Abs: 5.7 10*3/uL (ref 1.7–7.7)
Neutrophils Relative %: 87 %
Platelets: 137 10*3/uL — ABNORMAL LOW (ref 150–400)
RBC: 3.44 MIL/uL — ABNORMAL LOW (ref 4.22–5.81)
RDW: 11.9 % (ref 11.5–15.5)
WBC: 6.5 10*3/uL (ref 4.0–10.5)
nRBC: 0 % (ref 0.0–0.2)

## 2020-04-22 LAB — URINALYSIS, COMPLETE (UACMP) WITH MICROSCOPIC
Bilirubin Urine: NEGATIVE
Glucose, UA: NEGATIVE mg/dL
Hgb urine dipstick: NEGATIVE
Ketones, ur: NEGATIVE mg/dL
Leukocytes,Ua: NEGATIVE
Nitrite: NEGATIVE
Protein, ur: NEGATIVE mg/dL
Specific Gravity, Urine: 1.012 (ref 1.005–1.030)
Squamous Epithelial / HPF: NONE SEEN (ref 0–5)
pH: 6 (ref 5.0–8.0)

## 2020-04-22 LAB — BASIC METABOLIC PANEL
Anion gap: 8 (ref 5–15)
BUN: 22 mg/dL (ref 8–23)
CO2: 25 mmol/L (ref 22–32)
Calcium: 10.5 mg/dL — ABNORMAL HIGH (ref 8.9–10.3)
Chloride: 102 mmol/L (ref 98–111)
Creatinine, Ser: 1.22 mg/dL (ref 0.61–1.24)
GFR, Estimated: 60 mL/min — ABNORMAL LOW (ref >60)
Glucose, Bld: 125 mg/dL — ABNORMAL HIGH (ref 70–99)
Potassium: 5.2 mmol/L — ABNORMAL HIGH (ref 3.5–5.1)
Sodium: 135 mmol/L (ref 135–145)

## 2020-04-22 MED ORDER — LIDOCAINE HCL URETHRAL/MUCOSAL 2 % EX GEL
1.0000 "application " | Freq: Once | CUTANEOUS | Status: AC
  Administered 2020-04-22: 1 via URETHRAL
  Filled 2020-04-22: qty 10

## 2020-04-22 MED ORDER — SODIUM CHLORIDE 0.9 % IV SOLN
2.0000 g | Freq: Once | INTRAVENOUS | Status: AC
  Administered 2020-04-22: 2 g via INTRAVENOUS
  Filled 2020-04-22: qty 20

## 2020-04-22 NOTE — ED Notes (Signed)
Pt resting in bed. Speaking on phone with family member. No needs expressed at this time.

## 2020-04-22 NOTE — ED Provider Notes (Signed)
-----------------------------------------   7:02 AM on 04/22/2020 -----------------------------------------  Blood pressure (!) 166/63, pulse 87, temperature 99.1 F (37.3 C), temperature source Oral, resp. rate 18, height 5\' 9"  (1.753 m), weight 70.3 kg, SpO2 94 %.  Assuming care from Dr. Ellender Hose.  In short, Craig Wilson is a 79 y.o. male with a chief complaint of Urinary Retention .  Refer to the original H&P for additional details.  The current plan of care is to follow-up labs and UA, plan for discharge home with Urology follow-up.  ----------------------------------------- 8:58 AM on 04/22/2020 -----------------------------------------  Patient reports feeling much better following placement of Foley catheter.  He is draining clear yellow urine without difficulty and denies any recent urinary symptoms.  UA does not appear consistent with infection, patient did receive initial dose of Rocephin.  We will hold off on further antibiotics at this time and send urine for culture, have him follow-up closely with his urologist.  Patient was counseled to return to the ED for any new or worsening symptoms.    Blake Divine, MD 04/22/20 [phone]

## 2020-04-22 NOTE — ED Notes (Signed)
Bladder scan showing more than 500 ml urine.

## 2020-04-22 NOTE — ED Triage Notes (Signed)
Patient reports urinary retention.  Reports had catheter removed yesterday at [facility].  Patient reports last foley that was placed he was told had damaged his bladder.

## 2020-04-22 NOTE — ED Provider Notes (Signed)
[facility] Emergency Department Provider Note  ____________________________________________   First MD Initiated Contact with Patient 04/22/20 [phone]     (approximate)  I have reviewed the triage vital signs and the nursing notes.   HISTORY  Chief Complaint Urinary Retention    HPI Craig Wilson is a 79 y.o. male  Here with urinary retention. Pt has an extensive urological history including AUS and recent catheter placement. He was at [facility] yesterday and had his AUS deactivated and catheter removed. He has been unable to urinate since then. He reports aching, throbbing, severe suprapubic pain. Pain worse w/ movement, palpation. No urine output noted. No fever, chills. No flank pain. No nausea or vomiting. No other complaints.        Past Medical History:  Diagnosis Date  . Anemia   . Bipolar disorder ([place])   . Chronic diarrhea   . Colon polyps   . Depression   . Dermatophytosis   . Diabetic neuropathy ([place])   . DJD (degenerative joint disease)   . Dyspnea   . Gout   . Gross hematuria   . HLD (hyperlipidemia)   . HTN (hypertension)   . Hyperkeratosis   . Onychomycosis   . Parkinson disease ([place])   . Prostate cancer ([place])   . Sleep apnea   . Tremor   . Type 2 diabetes mellitus [facility])     Patient Active Problem List   Diagnosis Date Noted  . OSA (obstructive sleep apnea) 11/03/2019  . RBBB (right bundle branch block) 11/03/2019  . 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 Other emphysema ([place]) 06/27/2017  . Acquired trigger finger 04/10/2017  . Elevated coronary artery calcium score 03/04/2017  . Sepsis ([place]) 07/01/2016  . CAP (community acquired pneumonia) 07/01/2016  . Influenza A 07/01/2016  . DDD (degenerative disc disease), lumbar 03/11/2016  . Senile purpura ([place]) 03/11/2016  . 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 Mass of left side of neck 10/19/2015  . Thrombocytopenia ([place]) 09/07/2015  . Bilateral edema of lower extremity  05/10/2015  . Bipolar disorder, in partial remission, most recent episode depressed ([place]) 05/10/2015  . 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 Edema of lower extremity 05/10/2015  . RLS (restless legs syndrome) 03/29/2015  . Adenomatous polyp 02/24/2015  . Anemia 02/24/2015  . Affective bipolar disorder ([place]) 02/24/2015  . Chronic diarrhea 02/24/2015  . Diabetic neuropathy ([place]) 02/24/2015  . Arthritis, degenerative 02/24/2015  . 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 BP (high blood pressure) 02/24/2015  . CA of prostate ([place]) 02/24/2015  . Incontinence 02/01/2015  . History of prostate cancer 02/01/2015  . Gross hematuria 02/01/2015  . SOB (shortness of breath) 12/16/2014  . Pain in the chest 12/16/2014  . Family history of premature CAD 12/16/2014  . Parkinson's disease ([place]) 12/16/2014  . Essential hypertension 12/16/2014  . Unsteady gait 12/16/2014  . Hyperlipidemia 12/16/2014  . 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 Difficulty in walking 10/10/2014  . Difficulty sleeping 10/10/2014  . Breath shortness 05/06/2014  . Amnesia 02/28/2014  . Diabetes mellitus, type 2 ([place]) 02/15/2014  . Has a tremor 02/01/2006  . Tremor 02/01/2006    Past Surgical History:  Procedure Laterality Date  . cataract surgery Bilateral   . COLONOSCOPY W/ BIOPSIES    . FOOT SURGERY    . PROSTATE SURGERY     removed  . status post implantation of artificial urinary sphincter     . TONSILLECTOMY  Prior to Admission medications   Medication Sig Start Date End Date Taking? Authorizing Provider  atorvastatin (LIPITOR) 40 MG tablet Take 40 mg by mouth daily.    [provider]  carbidopa-levodopa (SINEMET IR) 25-100 MG tablet  01/13/19   [provider]  glucose blood test strip 1 each by Other route as needed for other. Use as instructed    [provider]  lithium carbonate (LITHOBID) 300 MG CR tablet Take 300 mg by mouth 2 (two)  times daily.     [provider]  losartan (COZAAR) 50 MG tablet TAKE 1 TABLET BY MOUTH  DAILY 11/30/19   Minna Merritts, MD  metFORMIN (GLUCOPHAGE) 500 MG tablet Take 500 mg by mouth 2 (two) times daily with a meal.    [provider]  Sharp by Does not apply route.    [provider]  mirabegron ER (MYRBETRIQ) 25 MG TB24 tablet Take 1 tablet (25 mg total) by mouth daily. 03/11/19   McGowan, Larene Beach A, PA-C  MITIGARE 0.6 MG CAPS Take 1 capsule by mouth daily.  01/07/16   [provider]  omeprazole (PRILOSEC) 40 MG capsule Take 40 mg by mouth daily.  01/18/16   [provider]  rOPINIRole (REQUIP) 2 MG tablet daily.  01/22/15   [provider]  traZODone (DESYREL) 50 MG tablet 50 mg at bedtime.  12/16/14   [provider]    Allergies Carbidopa w-levodopa, Carbidopa-levodopa, Latex, and Solifenacin  Family History  Problem Relation Age of Onset  . Heart disease Mother   . Heart attack Father 17  . Hypertension Father   . Hyperlipidemia Father   . Kidney cancer Neg Hx   . Prostate cancer Neg Hx   . Bladder Cancer Neg Hx     Social History Social History   Tobacco Use  . Smoking status: Former Smoker    Quit date: 10/24/1999    Years since quitting: 20.5  . Smokeless tobacco: Never Used  Substance Use Topics  . Alcohol use: No    Alcohol/week: 0.0 standard drinks  . Drug use: No    Review of Systems  Review of Systems  Constitutional: Negative for chills and fever.  HENT: Negative for sore throat.   Respiratory: Negative for shortness of breath.   Cardiovascular: Negative for chest pain.  Gastrointestinal: Positive for abdominal distention and abdominal pain.  Genitourinary: Positive for difficulty urinating. Negative for flank pain.  Musculoskeletal: Negative for neck pain.  Skin: Negative for rash and wound.  Allergic/Immunologic: Negative for immunocompromised state.  Neurological: Negative  for weakness and numbness.  Hematological: Does not bruise/bleed easily.     ____________________________________________  PHYSICAL EXAM:      VITAL SIGNS: ED Triage Vitals  Enc Vitals Group     BP 04/22/20 0517 (!) 166/63     Pulse Rate 04/22/20 0517 87     Resp 04/22/20 0517 18     Temp 04/22/20 0517 99.1 F (37.3 C)     Temp Source 04/22/20 0517 Oral     SpO2 04/22/20 0517 94 %     Weight 04/22/20 0514 154 lb 15.7 oz (70.3 kg)     Height 04/22/20 0514 5\' 9"  (1.753 m)     Head Circumference --      Peak Flow --      Pain Score 04/22/20 0514 10     Pain Loc --      Pain Edu? --  Excl. in [place]? --      Physical Exam Vitals and nursing note reviewed.  Constitutional:      General: He is not in acute distress.    Appearance: He is well-developed.  HENT:     Head: Normocephalic and atraumatic.  Eyes:     Conjunctiva/sclera: Conjunctivae normal.  Cardiovascular:     Rate and Rhythm: Normal rate and regular rhythm.     Heart sounds: Normal heart sounds.  Pulmonary:     Effort: Pulmonary effort is normal. No respiratory distress.     Breath sounds: No wheezing.  Abdominal:     General: There is no distension.  Genitourinary:    Comments: No urethral swelling or bleeding. AUS appears in place, with control in scrotum. No scrotal swelling or redness. Musculoskeletal:     Cervical back: Neck supple.  Skin:    General: Skin is warm.     Capillary Refill: Capillary refill takes less than 2 seconds.     Findings: No rash.  Neurological:     Mental Status: He is alert and oriented to person, place, and time.     Motor: No abnormal muscle tone.       ____________________________________________   LABS (all labs ordered are listed, but only abnormal results are displayed)  Labs Reviewed  CBC WITH DIFFERENTIAL/PLATELET - Abnormal; Notable for the following components:      Result Value   RBC 3.44 (*)    Hemoglobin 11.4 (*)    HCT 35.0 (*)    MCV 101.7 (*)     Platelets 137 (*)    Lymphs Abs 0.4 (*)    All other components within normal limits  BASIC METABOLIC PANEL - Abnormal; Notable for the following components:   Potassium 5.2 (*)    Glucose, Bld 125 (*)    Calcium 10.5 (*)    GFR, Estimated 60 (*)    All other components within normal limits  URINALYSIS, COMPLETE (UACMP) WITH MICROSCOPIC - Abnormal; Notable for the following components:   Color, Urine YELLOW (*)    APPearance CLEAR (*)    Bacteria, UA RARE (*)    All other components within normal limits  URINE CULTURE    ____________________________________________  EKG:  ________________________________________  RADIOLOGY All imaging, including plain films, CT scans, and ultrasounds, independently reviewed by me, and interpretations confirmed via formal radiology reads.  ED MD interpretation:     Official radiology report(s): No results found.  ____________________________________________  PROCEDURES   Procedure(s) performed (including Critical Care):  BLADDER CATHETERIZATION  Date/Time: 04/22/2020 6:53 AM Performed by: Duffy Bruce, MD Authorized by: Duffy Bruce, MD   Consent:    Consent obtained:  Verbal   Consent given by:  Patient   Risks discussed:  False passage, incomplete procedure, infection, pain and urethral injury   Alternatives discussed:  Alternative treatment Pre-procedure details:    Procedure purpose:  Therapeutic   Preparation: Patient was prepped and draped in usual sterile fashion   Anesthesia (see MAR for exact dosages):    Anesthesia method:  Topical application   Topical anesthetic:  Lidocaine gel Procedure details:    Provider performed due to:  Altered anatomy and complicated insertion   Altered anatomy:  Urethral stricture   Catheter insertion:  Indwelling   Catheter type:  Foley   Catheter size:  14 Fr   Number of attempts:  1   Urine characteristics:  Mildly cloudy Post-procedure details:    Patient tolerance of  procedure:  Tolerated  well, no immediate complications    ____________________________________________  INITIAL IMPRESSION / MDM / ASSESSMENT AND PLAN / ED COURSE  As part of my medical decision making, I reviewed the following data within the [place] notes reviewed and incorporated, Old chart reviewed, Notes from prior ED visits, and [place] Controlled Substance [place] was evaluated in Emergency Department on 04/22/2020 for the symptoms described in the history of present illness. He was evaluated in the context of the global COVID-19 pandemic, which necessitated consideration that the patient might be at risk for infection with the SARS-CoV-2 virus that causes COVID-19. Institutional protocols and algorithms that pertain to the evaluation of patients at risk for COVID-19 are in a state of rapid change based on information released by regulatory bodies including the CDC and federal and state organizations. These policies and algorithms were followed during the patient's care in the ED.  Some ED evaluations and interventions may be delayed as a result of limited staffing during the pandemic.*     Medical Decision Making:  79 yo M here with urinary retention. Pt has complex Urological history including AUS and recent traumatic foley placement. Pt with obvious retention on arrival and significant discomfort. Discussed with Dr. Jeffie Pollock of Urology, who recommended deactivating AUS and attempting foley placement. AUS deactivated and 65F foley placed by myself without difficulty. Tolerated well. Will check basic labs, UA and plan to likely d/c.   ____________________________________________  FINAL CLINICAL IMPRESSION(S) / ED DIAGNOSES  Final diagnoses:  Urinary retention     MEDICATIONS GIVEN DURING THIS VISIT:  Medications  lidocaine (XYLOCAINE) 2 % jelly 1 application (1 application Urethral Given 04/22/20 0655)  cefTRIAXone (ROCEPHIN) 2 g  in sodium chloride 0.9 % 100 mL IVPB (0 g Intravenous Stopped 04/22/20 0710)     ED Discharge Orders    None       Note:  This document was prepared using Dragon voice recognition software and may include unintentional dictation errors.   Duffy Bruce, MD 04/22/20 [phone]

## 2020-04-22 NOTE — ED Notes (Signed)
Called lab. Lab will add on urine culture to previous urine collection.

## 2020-04-23 LAB — URINE CULTURE: Culture: NO GROWTH

## 2020-04-27 DIAGNOSIS — N393 Stress incontinence (female) (male): Secondary | ICD-10-CM | POA: Insufficient documentation

## 2020-05-19 ENCOUNTER — Encounter: Payer: Self-pay | Admitting: Urology

## 2020-05-19 ENCOUNTER — Ambulatory Visit: Payer: Self-pay | Admitting: Urology

## 2020-06-16 ENCOUNTER — Other Ambulatory Visit: Payer: Self-pay | Admitting: Internal Medicine

## 2020-06-16 DIAGNOSIS — D696 Thrombocytopenia, unspecified: Secondary | ICD-10-CM

## 2020-06-16 DIAGNOSIS — R053 Chronic cough: Secondary | ICD-10-CM

## 2020-06-16 DIAGNOSIS — R634 Abnormal weight loss: Secondary | ICD-10-CM

## 2020-06-16 DIAGNOSIS — Z8546 Personal history of malignant neoplasm of prostate: Secondary | ICD-10-CM

## 2020-07-03 ENCOUNTER — Ambulatory Visit
Admission: RE | Admit: 2020-07-03 | Discharge: 2020-07-03 | Disposition: A | Discharge status: Home / Self Care | Payer: Medicare Other | Source: Ambulatory Visit | Attending: Internal Medicine | Admitting: Internal Medicine

## 2020-07-03 ENCOUNTER — Other Ambulatory Visit: Payer: Self-pay

## 2020-07-03 DIAGNOSIS — R053 Chronic cough: Secondary | ICD-10-CM | POA: Diagnosis present

## 2020-07-03 DIAGNOSIS — D696 Thrombocytopenia, unspecified: Secondary | ICD-10-CM

## 2020-07-03 DIAGNOSIS — Z8546 Personal history of malignant neoplasm of prostate: Secondary | ICD-10-CM | POA: Diagnosis present

## 2020-07-03 DIAGNOSIS — R634 Abnormal weight loss: Secondary | ICD-10-CM | POA: Diagnosis present

## 2020-07-03 MED ORDER — IOHEXOL 300 MG/ML  SOLN
85.0000 mL | Freq: Once | INTRAMUSCULAR | Status: AC | PRN
  Administered 2020-07-03: 85 mL via INTRAVENOUS

## 2020-10-31 ENCOUNTER — Inpatient Hospital Stay
Admission: EM | Admit: 2020-10-31 | Discharge: 2020-11-02 | DRG: 683 | Disposition: A | Discharge status: Home / Self Care | Payer: Medicare Other | Attending: Internal Medicine | Admitting: Internal Medicine

## 2020-10-31 ENCOUNTER — Other Ambulatory Visit: Payer: Self-pay

## 2020-10-31 ENCOUNTER — Inpatient Hospital Stay: Payer: Medicare Other

## 2020-10-31 DIAGNOSIS — E782 Mixed hyperlipidemia: Secondary | ICD-10-CM

## 2020-10-31 DIAGNOSIS — Z7984 Long term (current) use of oral hypoglycemic drugs: Secondary | ICD-10-CM

## 2020-10-31 DIAGNOSIS — N3000 Acute cystitis without hematuria: Secondary | ICD-10-CM | POA: Diagnosis present

## 2020-10-31 DIAGNOSIS — N281 Cyst of kidney, acquired: Secondary | ICD-10-CM | POA: Diagnosis present

## 2020-10-31 DIAGNOSIS — N19 Unspecified kidney failure: Secondary | ICD-10-CM

## 2020-10-31 DIAGNOSIS — E785 Hyperlipidemia, unspecified: Secondary | ICD-10-CM | POA: Diagnosis present

## 2020-10-31 DIAGNOSIS — E871 Hypo-osmolality and hyponatremia: Secondary | ICD-10-CM

## 2020-10-31 DIAGNOSIS — Z87891 Personal history of nicotine dependence: Secondary | ICD-10-CM

## 2020-10-31 DIAGNOSIS — G20 Parkinson's disease: Secondary | ICD-10-CM

## 2020-10-31 DIAGNOSIS — F319 Bipolar disorder, unspecified: Secondary | ICD-10-CM | POA: Diagnosis present

## 2020-10-31 DIAGNOSIS — Z9079 Acquired absence of other genital organ(s): Secondary | ICD-10-CM

## 2020-10-31 DIAGNOSIS — M109 Gout, unspecified: Secondary | ICD-10-CM | POA: Diagnosis present

## 2020-10-31 DIAGNOSIS — I214 Non-ST elevation (NSTEMI) myocardial infarction: Secondary | ICD-10-CM | POA: Diagnosis not present

## 2020-10-31 DIAGNOSIS — R338 Other retention of urine: Secondary | ICD-10-CM | POA: Diagnosis not present

## 2020-10-31 DIAGNOSIS — A419 Sepsis, unspecified organism: Secondary | ICD-10-CM | POA: Diagnosis not present

## 2020-10-31 DIAGNOSIS — N179 Acute kidney failure, unspecified: Secondary | ICD-10-CM | POA: Diagnosis present

## 2020-10-31 DIAGNOSIS — E1140 Type 2 diabetes mellitus with diabetic neuropathy, unspecified: Secondary | ICD-10-CM | POA: Diagnosis present

## 2020-10-31 DIAGNOSIS — E869 Volume depletion, unspecified: Secondary | ICD-10-CM | POA: Diagnosis present

## 2020-10-31 DIAGNOSIS — Z923 Personal history of irradiation: Secondary | ICD-10-CM

## 2020-10-31 DIAGNOSIS — Z20822 Contact with and (suspected) exposure to covid-19: Secondary | ICD-10-CM | POA: Diagnosis present

## 2020-10-31 DIAGNOSIS — N320 Bladder-neck obstruction: Secondary | ICD-10-CM

## 2020-10-31 DIAGNOSIS — J9602 Acute respiratory failure with hypercapnia: Secondary | ICD-10-CM | POA: Diagnosis not present

## 2020-10-31 DIAGNOSIS — E119 Type 2 diabetes mellitus without complications: Secondary | ICD-10-CM

## 2020-10-31 DIAGNOSIS — R4182 Altered mental status, unspecified: Secondary | ICD-10-CM | POA: Diagnosis not present

## 2020-10-31 DIAGNOSIS — K5900 Constipation, unspecified: Secondary | ICD-10-CM | POA: Diagnosis present

## 2020-10-31 DIAGNOSIS — N390 Urinary tract infection, site not specified: Secondary | ICD-10-CM

## 2020-10-31 DIAGNOSIS — Z8546 Personal history of malignant neoplasm of prostate: Secondary | ICD-10-CM

## 2020-10-31 DIAGNOSIS — I10 Essential (primary) hypertension: Secondary | ICD-10-CM | POA: Diagnosis present

## 2020-10-31 DIAGNOSIS — Z79899 Other long term (current) drug therapy: Secondary | ICD-10-CM

## 2020-10-31 DIAGNOSIS — J9601 Acute respiratory failure with hypoxia: Secondary | ICD-10-CM | POA: Diagnosis not present

## 2020-10-31 DIAGNOSIS — N139 Obstructive and reflux uropathy, unspecified: Secondary | ICD-10-CM | POA: Diagnosis present

## 2020-10-31 LAB — BASIC METABOLIC PANEL
Anion gap: 11 (ref 5–15)
Anion gap: 12 (ref 5–15)
Anion gap: 9 (ref 5–15)
BUN: 87 mg/dL — ABNORMAL HIGH (ref 8–23)
BUN: 79 mg/dL — ABNORMAL HIGH (ref 8–23)
BUN: 73 mg/dL — ABNORMAL HIGH (ref 8–23)
CO2: 24 mmol/L (ref 22–32)
CO2: 22 mmol/L (ref 22–32)
CO2: 27 mmol/L (ref 22–32)
Calcium: 9.9 mg/dL (ref 8.9–10.3)
Calcium: 10.1 mg/dL (ref 8.9–10.3)
Calcium: 9.6 mg/dL (ref 8.9–10.3)
Chloride: 91 mmol/L — ABNORMAL LOW (ref 98–111)
Chloride: 96 mmol/L — ABNORMAL LOW (ref 98–111)
Chloride: 97 mmol/L — ABNORMAL LOW (ref 98–111)
Creatinine, Ser: 5.45 mg/dL — ABNORMAL HIGH (ref 0.61–1.24)
Creatinine, Ser: 4.69 mg/dL — ABNORMAL HIGH (ref 0.61–1.24)
Creatinine, Ser: 3.74 mg/dL — ABNORMAL HIGH (ref 0.61–1.24)
GFR, Estimated: 10 mL/min — ABNORMAL LOW (ref >60)
GFR, Estimated: 12 mL/min — ABNORMAL LOW (ref >60)
GFR, Estimated: 15 mL/min — ABNORMAL LOW (ref >60)
Glucose, Bld: 260 mg/dL — ABNORMAL HIGH (ref 70–99)
Glucose, Bld: 153 mg/dL — ABNORMAL HIGH (ref 70–99)
Glucose, Bld: 166 mg/dL — ABNORMAL HIGH (ref 70–99)
Potassium: 4.9 mmol/L (ref 3.5–5.1)
Potassium: 4.6 mmol/L (ref 3.5–5.1)
Potassium: 4 mmol/L (ref 3.5–5.1)
Sodium: 126 mmol/L — ABNORMAL LOW (ref 135–145)
Sodium: 130 mmol/L — ABNORMAL LOW (ref 135–145)
Sodium: 133 mmol/L — ABNORMAL LOW (ref 135–145)

## 2020-10-31 LAB — URINALYSIS, COMPLETE (UACMP) WITH MICROSCOPIC
Bilirubin Urine: NEGATIVE
Glucose, UA: NEGATIVE mg/dL
Ketones, ur: NEGATIVE mg/dL
Nitrite: NEGATIVE
Protein, ur: NEGATIVE mg/dL
RBC / HPF: >50 RBC/hpf — ABNORMAL HIGH (ref 0–5)
Specific Gravity, Urine: 1.01 (ref 1.005–1.030)
pH: 5 (ref 5.0–8.0)

## 2020-10-31 LAB — CBC
HCT: 32.7 % — ABNORMAL LOW (ref 39.0–52.0)
Hemoglobin: 10.8 g/dL — ABNORMAL LOW (ref 13.0–17.0)
MCH: 32.5 pg (ref 26.0–34.0)
MCHC: 33 g/dL (ref 30.0–36.0)
MCV: 98.5 fL (ref 80.0–100.0)
Platelets: 177 10*3/uL (ref 150–400)
RBC: 3.32 MIL/uL — ABNORMAL LOW (ref 4.22–5.81)
RDW: 12 % (ref 11.5–15.5)
WBC: 20.1 10*3/uL — ABNORMAL HIGH (ref 4.0–10.5)
nRBC: 0 % (ref 0.0–0.2)

## 2020-10-31 LAB — GLUCOSE, CAPILLARY: Glucose-Capillary: 220 mg/dL — ABNORMAL HIGH (ref 70–99)

## 2020-10-31 MED ORDER — INSULIN ASPART 100 UNIT/ML IJ SOLN
0.0000 [IU] | Freq: Three times a day (TID) | INTRAMUSCULAR | Status: DC
  Administered 2020-10-31: 5 [IU] via SUBCUTANEOUS
  Administered 2020-11-01 (×2): 2 [IU] via SUBCUTANEOUS
  Administered 2020-11-01: 5 [IU] via SUBCUTANEOUS
  Administered 2020-11-02: 2 [IU] via SUBCUTANEOUS
  Filled 2020-10-31 (×4): qty 1

## 2020-10-31 MED ORDER — SODIUM CHLORIDE 0.9 % IV BOLUS
500.0000 mL | Freq: Once | INTRAVENOUS | Status: AC
  Administered 2020-10-31: 500 mL via INTRAVENOUS

## 2020-10-31 MED ORDER — HEPARIN SODIUM (PORCINE) 5000 UNIT/ML IJ SOLN
5000.0000 [IU] | Freq: Three times a day (TID) | INTRAMUSCULAR | Status: DC
  Administered 2020-10-31 – 2020-11-02 (×5): 5000 [IU] via SUBCUTANEOUS
  Filled 2020-10-31 (×5): qty 1

## 2020-10-31 MED ORDER — ACETAMINOPHEN 650 MG RE SUPP: 650.0000 mg | Freq: Four times a day (QID) | RECTAL | Status: DC | PRN

## 2020-10-31 MED ORDER — POLYETHYLENE GLYCOL 3350 17 G PO PACK: 17.0000 g | PACK | Freq: Every day | ORAL | Status: DC | PRN

## 2020-10-31 MED ORDER — ONDANSETRON HCL 4 MG PO TABS: 4.0000 mg | ORAL_TABLET | Freq: Four times a day (QID) | ORAL | Status: DC | PRN

## 2020-10-31 MED ORDER — SODIUM CHLORIDE 0.9 % IV SOLN
1.0000 g | Freq: Once | INTRAVENOUS | Status: AC
  Administered 2020-10-31: 1 g via INTRAVENOUS
  Filled 2020-10-31: qty 10

## 2020-10-31 MED ORDER — ONDANSETRON HCL 4 MG/2ML IJ SOLN: 4.0000 mg | Freq: Four times a day (QID) | INTRAMUSCULAR | Status: DC | PRN

## 2020-10-31 MED ORDER — SODIUM CHLORIDE 0.9 % IV SOLN: INTRAVENOUS | Status: DC

## 2020-10-31 MED ORDER — ACETAMINOPHEN 325 MG PO TABS: 650.0000 mg | ORAL_TABLET | Freq: Four times a day (QID) | ORAL | Status: DC | PRN

## 2020-10-31 NOTE — ED Notes (Signed)
Dr Bernardo Heater has come in and deactivated patient's urinary sphincter device.  Patient voided large amount, plus there are a couple soaked towels.  Urine to lab.

## 2020-10-31 NOTE — ED Provider Notes (Signed)
[facility] Emergency Department Provider Note   ____________________________________________    I have reviewed the triage vital signs and the nursing notes.   HISTORY  Chief Complaint Constipation and Urinary Retention     HPI Craig Wilson is a 80 y.o. male with extensive past medical history as detailed below who presents with urinary retention.  Patient reports he has a urinary sphincter for incontinence that was recently replaced.  Reviewed patient's records via care everywhere.  Patient's urinary sphincter was replaced in February but had significant postoperative swelling so was only recently activated on 25 May.  It sounds like patient has had difficulty urinating since that time.  He complains of swelling and discomfort in his lower abdomen.  No fevers or chills  Past Medical History:  Diagnosis Date  . Anemia   . Bipolar disorder ([place])   . Chronic diarrhea   . Colon polyps   . Depression   . Dermatophytosis   . Diabetic neuropathy ([place])   . DJD (degenerative joint disease)   . Dyspnea   . Gout   . Gross hematuria   . HLD (hyperlipidemia)   . HTN (hypertension)   . Hyperkeratosis   . Onychomycosis   . Parkinson disease ([place])   . Prostate cancer ([place])   . Sleep apnea   . Tremor   . Type 2 diabetes mellitus [facility])     Patient Active Problem List   Diagnosis Date Noted  . Acute kidney injury ([place]) 10/31/2020  . 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 OSA (obstructive sleep apnea) 11/03/2019  . RBBB (right bundle branch block) 11/03/2019  . 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 Other emphysema ([place]) 06/27/2017  . Acquired trigger finger 04/10/2017  . Elevated coronary artery calcium score 03/04/2017  . Sepsis ([place]) 07/01/2016  . CAP (community acquired pneumonia) 07/01/2016  . Influenza A 07/01/2016  . DDD (degenerative disc disease), lumbar 03/11/2016  . Senile purpura ([place]) 03/11/2016  . 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 Mass of left side of neck 10/19/2015  . Thrombocytopenia ([place]) 09/07/2015  . Bilateral edema of lower extremity 05/10/2015  . Bipolar disorder, in partial remission, most recent episode depressed ([place]) 05/10/2015  . 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 Edema of lower extremity 05/10/2015  . RLS (restless legs syndrome) 03/29/2015  . Adenomatous polyp 02/24/2015  . Anemia 02/24/2015  . Affective bipolar disorder ([place]) 02/24/2015  . Chronic diarrhea 02/24/2015  . Diabetic neuropathy ([place]) 02/24/2015  . Arthritis, degenerative 02/24/2015  . 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 BP (high blood pressure) 02/24/2015  . CA of prostate ([place]) 02/24/2015  . Incontinence 02/01/2015  . History of prostate cancer 02/01/2015  . Gross hematuria 02/01/2015  . SOB (shortness of breath) 12/16/2014  . Pain in the chest 12/16/2014  . Family history of premature CAD 12/16/2014  . Parkinson's disease ([place]) 12/16/2014  . Essential hypertension 12/16/2014  . Unsteady gait 12/16/2014  . Hyperlipidemia 12/16/2014  . 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 Difficulty in walking 10/10/2014  . Difficulty sleeping 10/10/2014  . Breath shortness 05/06/2014  . Amnesia 02/28/2014  . Diabetes mellitus, type 2 ([place]) 02/15/2014  . Has a tremor 02/01/2006  . Tremor 02/01/2006    Past Surgical History:  Procedure Laterality Date  . cataract surgery Bilateral   . COLONOSCOPY W/ BIOPSIES    . FOOT SURGERY    . PROSTATE SURGERY     removed  . status post implantation  of artificial urinary sphincter     . TONSILLECTOMY      Prior to Admission medications   Medication Sig Start Date End Date Taking? Authorizing Provider  atorvastatin (LIPITOR) 40 MG tablet Take 40 mg by mouth daily.    [provider]  carbidopa-levodopa (SINEMET IR) 25-100 MG tablet  01/13/19   [provider]  lithium carbonate (LITHOBID) 300 MG CR tablet Take 300 mg by mouth 2 (two)  times daily.     [provider]  losartan (COZAAR) 50 MG tablet TAKE 1 TABLET BY MOUTH  DAILY 11/30/19   Minna Merritts, MD  metFORMIN (GLUCOPHAGE) 500 MG tablet Take 500 mg by mouth 2 (two) times daily with a meal.    [provider]  mirabegron ER (MYRBETRIQ) 25 MG TB24 tablet Take 1 tablet (25 mg total) by mouth daily. 03/11/19   McGowan, Larene Beach A, PA-C  MITIGARE 0.6 MG CAPS Take 1 capsule by mouth daily.  01/07/16   [provider]  omeprazole (PRILOSEC) 40 MG capsule Take 40 mg by mouth daily.  01/18/16   [provider]  rOPINIRole (REQUIP) 2 MG tablet daily.  01/22/15   [provider]  traZODone (DESYREL) 50 MG tablet 50 mg at bedtime.  12/16/14   [provider]     Allergies Carbidopa w-levodopa, Carbidopa-levodopa, Latex, and Solifenacin  Family History  Problem Relation Age of Onset  . Heart disease Mother   . Heart attack Father 13  . Hypertension Father   . Hyperlipidemia Father   . Kidney cancer Neg Hx   . Prostate cancer Neg Hx   . Bladder Cancer Neg Hx     Social History Social History   Tobacco Use  . Smoking status: Former Smoker    Quit date: 10/24/1999    Years since quitting: 21.0  . Smokeless tobacco: Never Used  Substance Use Topics  . Alcohol use: No    Alcohol/week: 0.0 standard drinks  . Drug use: No    Review of Systems  Constitutional: No fever/chills Eyes: No visual changes.  ENT: No sore throat. Cardiovascular: Denies chest pain. Respiratory: Denies shortness of breath. Gastrointestinal: As above Genitourinary: As above Musculoskeletal: Negative for back pain. Skin: Negative for rash. Neurological: Negative for headaches or weakness   ____________________________________________   PHYSICAL EXAM:  VITAL SIGNS: ED Triage Vitals  Enc Vitals Group     BP 10/31/20 1337 (!) 141/74     Pulse Rate 10/31/20 1337 91     Resp 10/31/20 1337 18     Temp 10/31/20 1337 98.8 F (37.1 C)      Temp Source 10/31/20 1337 Oral     SpO2 10/31/20 1337 98 %     Weight --      Height --      Head Circumference --      Peak Flow --      Pain Score 10/31/20 1337 7     Pain Loc --      Pain Edu? --      Excl. in [place]? --     Constitutional: Alert and oriented. No acute distress. Pleasant and interactive  Nose: No congestion/rhinnorhea. Mouth/Throat: Mucous membranes are moist.   Neck:  Painless ROM Cardiovascular: Normal rate, regular rhythm. Grossly normal heart sounds.  Good peripheral circulation. Respiratory: Normal respiratory effort.  No retractions. Lungs CTAB. Gastrointestinal: Distended lower abdomen consistent with enlarged bladder Genitourinary: Penis normal, attempted to deactivate sphincter device with some brief success Musculoskeletal:  Warm and well perfused Neurologic:  Normal speech and language. No gross focal neurologic deficits are appreciated.  Skin:  Skin is warm, dry and intact. No rash noted. Psychiatric: Mood and affect are normal. Speech and behavior are normal.  ____________________________________________   LABS (all labs ordered are listed, but only abnormal results are displayed)  Labs Reviewed  URINALYSIS, COMPLETE (UACMP) WITH MICROSCOPIC - Abnormal; Notable for the following components:      Result Value   Color, Urine YELLOW (*)    APPearance HAZY (*)    Hgb urine dipstick LARGE (*)    Leukocytes,Ua SMALL (*)    RBC / HPF >50 (*)    Bacteria, UA RARE (*)    All other components within normal limits  CBC - Abnormal; Notable for the following components:   WBC 20.1 (*)    RBC 3.32 (*)    Hemoglobin 10.8 (*)    HCT 32.7 (*)    All other components within normal limits  BASIC METABOLIC PANEL - Abnormal; Notable for the following components:   Sodium 126 (*)    Chloride 91 (*)    Glucose, Bld 260 (*)    BUN 87 (*)    Creatinine, Ser 5.45 (*)    GFR, Estimated 10 (*)    All other components within normal limits  URINE CULTURE   SARS CORONAVIRUS 2 (TAT 6-24 HRS)   ____________________________________________  EKG  None ____________________________________________  RADIOLOGY  None ____________________________________________   PROCEDURES  Procedure(s) performed: No  Procedures   Critical Care performed: No ____________________________________________   INITIAL IMPRESSION / ASSESSMENT AND PLAN / ED COURSE  Pertinent labs & imaging results that were available during my care of the patient were reviewed by me and considered in my medical decision making (see chart for details).  Patient presents with urinary retention secondary to urinary sphincter difficulties.  It appears the patient is having a hard time opening the sphincter via the mechanism in his scrotum.  Consulted Dr. Bernardo Heater who saw the patient in the ED and was able to deactivate the device  Patient's lab work is notable for a creatinine of 5.45 and a white blood cell count of 20.  He is afebrile, no tachycardia.  Urinalysis suspicious for UTI, will treat with IV Rocephin, IV fluids, have admitted to the hospitalist service   ____________________________________________   FINAL CLINICAL IMPRESSION(S) / ED DIAGNOSES  Final diagnoses:  Acute kidney injury ([place])  Lower urinary tract infectious disease        Note:  This document was prepared using Dragon voice recognition software and may include unintentional dictation errors.   Lavonia Drafts, MD 10/31/20 [phone]

## 2020-10-31 NOTE — ED Triage Notes (Signed)
Pt come with c/o constipation for three days and urinary retention. Pt states he has been able to dribble some but not enough. Pt states some blood in urine.  Pt states he has a urinary device AMS Spincter 800 Urinary implant. Pt states he can't be cath

## 2020-10-31 NOTE — Consult Note (Signed)
Urology Consult  Requesting physician: Dr. Corky Downs  Chief Complaint: Urinary retention  History of Present Illness: Craig Wilson is a 80 y.o. male with a history of prostate cancer status post radical prostatectomy in early 2000 and subsequent radiation therapy.  History of bladder neck contracture.  He underwent artificial urinary sphincter in 2006 and replacement at [facility] February 2022.  Due to scrotal swelling and difficulty palpating the scrotal pump his sphincter has been activated and deactivated several times.  It was last activated at [facility] 10/25/2020 and the patient presented to the ED today complaining of inability to urinate except for voiding minimal amounts.  Estimated bladder volume by bladder scan in the ED was >962.  Denies fever or chills.  Past Medical History:  Diagnosis Date  . Anemia   . Bipolar disorder ([place])   . Chronic diarrhea   . Colon polyps   . Depression   . Dermatophytosis   . Diabetic neuropathy ([place])   . DJD (degenerative joint disease)   . Dyspnea   . Gout   . Gross hematuria   . HLD (hyperlipidemia)   . HTN (hypertension)   . Hyperkeratosis   . Onychomycosis   . Parkinson disease ([place])   . Prostate cancer ([place])   . Sleep apnea   . Tremor   . Type 2 diabetes mellitus ([place])     Past Surgical History:  Procedure Laterality Date  . cataract surgery Bilateral   . COLONOSCOPY W/ BIOPSIES    . FOOT SURGERY    . PROSTATE SURGERY     removed  . status post implantation of artificial urinary sphincter     . TONSILLECTOMY      Home Medications:  No outpatient medications have been marked as taking for the 10/31/20 encounter [facility] Encounter).    Allergies:  Allergies  Allergen Reactions  . Carbidopa W-Levodopa Other (See Comments)    irrational behavior , felt zombie like  . Carbidopa-Levodopa Other (See Comments)    irrational behavior , felt zombie like  . Latex Other (See Comments)    Internal such as catheters. Latex  gloves are okay  . Solifenacin Other (See Comments)    Family History  Problem Relation Age of Onset  . Heart disease Mother   . Heart attack Father 66  . Hypertension Father   . Hyperlipidemia Father   . Kidney cancer Neg Hx   . Prostate cancer Neg Hx   . Bladder Cancer Neg Hx     Social History:  reports that he quit smoking about 21 years ago. He has never used smokeless tobacco. He reports that he does not drink alcohol and does not use drugs.  ROS: A complete review of systems was performed.  All systems are negative except for pertinent findings as noted.  Physical Exam:  Vital signs in last 24 hours: Temp:  [98.8 F (37.1 C)] 98.8 F (37.1 C) (05/31 1337) Pulse Rate:  [91] 91 (05/31 1337) Resp:  [18] 18 (05/31 1337) BP: (141)/(74) 141/74 (05/31 1337) SpO2:  [98 %] 98 % (05/31 1337) Constitutional:  Alert and oriented, No acute distress HEENT: [place] AT, moist mucus membranes.  Trachea midline, no masses GI: Abdomen is soft bladder palpable GU: Phallus without lesion, no erythema.  No scrotal erythema, tenderness or fluctuance.  The scrotal pump was compressed several times followed by immediate voiding.  It was allowed to refill ~ halfway and the sphincter was deactivated.  Fairly significant volume of urine  was spontaneously void with additional amount via [place].  After approximately 5 minutes there was a soft depression in the scrotal pump indicating a deactivated sphincter. Skin: No rashes, bruises or suspicious lesions Lymph: No cervical or inguinal adenopathy Neurologic: Grossly intact, no focal deficits, moving all 4 extremities Psychiatric: Normal mood and affect  Impression:   1.  Urinary retention  Patient has been unable to adequately manipulate the scrotal pump to empty the bladder  Plan:   Will keep sphincter deactivated  They state is difficult for him to get to [facility] and will see if Dr. Matilde Sprang will assume care  Dr. Corky Downs message me that patient's  creatinine was 5.45 and he will be admitted to the hospitalist service for AKI.  A small Foley catheter (71 [place]) can be placed 24-48 hours however patient should be incontinent with a deactivated sphincter and a condom catheter can also be placed to monitor urinary output   10/31/2020, 3:17 PM  John Giovanni,  MD

## 2020-10-31 NOTE — ED Triage Notes (Addendum)
First Nurse Note:  C/O being unable to void x 3 days.  Also c/o constipation.  Patient has a urinary control system implanted.

## 2020-10-31 NOTE — ED Notes (Signed)
Pt provided meal tray at this time, repositioned in bed by this RN and Miquel Dunn, Therapist, sports.

## 2020-10-31 NOTE — ED Notes (Signed)
Bladder scan >962

## 2020-11-01 DIAGNOSIS — N179 Acute kidney failure, unspecified: Secondary | ICD-10-CM | POA: Diagnosis not present

## 2020-11-01 LAB — CBC WITH DIFFERENTIAL/PLATELET
Abs Immature Granulocytes: 0.08 10*3/uL — ABNORMAL HIGH (ref 0.00–0.07)
Basophils Absolute: 0 10*3/uL (ref 0.0–0.1)
Basophils Relative: 0 %
Eosinophils Absolute: 0.5 10*3/uL (ref 0.0–0.5)
Eosinophils Relative: 3 %
HCT: 31.1 % — ABNORMAL LOW (ref 39.0–52.0)
Hemoglobin: 10.5 g/dL — ABNORMAL LOW (ref 13.0–17.0)
Immature Granulocytes: 1 %
Lymphocytes Relative: 5 %
Lymphs Abs: 0.8 10*3/uL (ref 0.7–4.0)
MCH: 33.5 pg (ref 26.0–34.0)
MCHC: 33.8 g/dL (ref 30.0–36.0)
MCV: 99.4 fL (ref 80.0–100.0)
Monocytes Absolute: 1 10*3/uL (ref 0.1–1.0)
Monocytes Relative: 6 %
Neutro Abs: 12.7 10*3/uL — ABNORMAL HIGH (ref 1.7–7.7)
Neutrophils Relative %: 85 %
Platelets: 184 10*3/uL (ref 150–400)
RBC: 3.13 MIL/uL — ABNORMAL LOW (ref 4.22–5.81)
RDW: 12.2 % (ref 11.5–15.5)
WBC: 15 10*3/uL — ABNORMAL HIGH (ref 4.0–10.5)
nRBC: 0 % (ref 0.0–0.2)

## 2020-11-01 LAB — COMPREHENSIVE METABOLIC PANEL
ALT: 15 U/L (ref 0–44)
AST: 11 U/L — ABNORMAL LOW (ref 15–41)
Albumin: 2.7 g/dL — ABNORMAL LOW (ref 3.5–5.0)
Alkaline Phosphatase: 63 U/L (ref 38–126)
Anion gap: 7 (ref 5–15)
BUN: 73 mg/dL — ABNORMAL HIGH (ref 8–23)
CO2: 26 mmol/L (ref 22–32)
Calcium: 9.7 mg/dL (ref 8.9–10.3)
Chloride: 102 mmol/L (ref 98–111)
Creatinine, Ser: 3.27 mg/dL — ABNORMAL HIGH (ref 0.61–1.24)
GFR, Estimated: 18 mL/min — ABNORMAL LOW (ref >60)
Glucose, Bld: 81 mg/dL (ref 70–99)
Potassium: 4 mmol/L (ref 3.5–5.1)
Sodium: 135 mmol/L (ref 135–145)
Total Bilirubin: 0.7 mg/dL (ref 0.3–1.2)
Total Protein: 5.8 g/dL — ABNORMAL LOW (ref 6.5–8.1)

## 2020-11-01 LAB — GLUCOSE, CAPILLARY
Glucose-Capillary: 120 mg/dL — ABNORMAL HIGH (ref 70–99)
Glucose-Capillary: 202 mg/dL — ABNORMAL HIGH (ref 70–99)
Glucose-Capillary: 141 mg/dL — ABNORMAL HIGH (ref 70–99)
Glucose-Capillary: 142 mg/dL — ABNORMAL HIGH (ref 70–99)

## 2020-11-01 LAB — BASIC METABOLIC PANEL
Anion gap: 7 (ref 5–15)
BUN: 68 mg/dL — ABNORMAL HIGH (ref 8–23)
CO2: 25 mmol/L (ref 22–32)
Calcium: 9.4 mg/dL (ref 8.9–10.3)
Chloride: 102 mmol/L (ref 98–111)
Creatinine, Ser: 2.76 mg/dL — ABNORMAL HIGH (ref 0.61–1.24)
GFR, Estimated: 22 mL/min — ABNORMAL LOW (ref >60)
Glucose, Bld: 128 mg/dL — ABNORMAL HIGH (ref 70–99)
Potassium: 4 mmol/L (ref 3.5–5.1)
Sodium: 134 mmol/L — ABNORMAL LOW (ref 135–145)

## 2020-11-01 LAB — MAGNESIUM: Magnesium: 3.7 mg/dL — ABNORMAL HIGH (ref 1.7–2.4)

## 2020-11-01 LAB — SARS CORONAVIRUS 2 (TAT 6-24 HRS): SARS Coronavirus 2: NEGATIVE

## 2020-11-01 MED ORDER — SODIUM CHLORIDE 0.9 % IV SOLN: 1.0000 g | Freq: Once | INTRAVENOUS | Status: DC

## 2020-11-01 MED ORDER — ROPINIROLE HCL 1 MG PO TABS
2.0000 mg | ORAL_TABLET | Freq: Every day | ORAL | Status: DC
  Administered 2020-11-01 (×2): 2 mg via ORAL
  Filled 2020-11-01 (×2): qty 2

## 2020-11-01 MED ORDER — CARBIDOPA-LEVODOPA 25-100 MG PO TABS
1.0000 | ORAL_TABLET | Freq: Two times a day (BID) | ORAL | Status: DC
  Administered 2020-11-01 – 2020-11-02 (×4): 1 via ORAL
  Filled 2020-11-01 (×4): qty 1

## 2020-11-01 MED ORDER — ATORVASTATIN CALCIUM 20 MG PO TABS
40.0000 mg | ORAL_TABLET | Freq: Every evening | ORAL | Status: DC
  Administered 2020-11-01: 40 mg via ORAL
  Filled 2020-11-01: qty 2

## 2020-11-01 MED ORDER — SODIUM CHLORIDE 0.9 % IV SOLN
1.0000 g | INTRAVENOUS | Status: DC
  Administered 2020-11-01: 1 g via INTRAVENOUS
  Filled 2020-11-01: qty 10
  Filled 2020-11-01: qty 1

## 2020-11-01 MED ORDER — TRAZODONE HCL 50 MG PO TABS
50.0000 mg | ORAL_TABLET | Freq: Every day | ORAL | Status: DC
  Administered 2020-11-01 (×2): 50 mg via ORAL
  Filled 2020-11-01 (×2): qty 1

## 2020-11-01 MED ORDER — HYDRALAZINE HCL 20 MG/ML IJ SOLN: 10.0000 mg | Freq: Four times a day (QID) | INTRAMUSCULAR | Status: DC | PRN

## 2020-11-01 NOTE — H&P (Addendum)
History and Physical    Craig Wilson:[id number] DOB: [birth year] DOA: 10/31/2020  PCP: Craig Hector, MD  Patient coming from: Home   Chief Complaint:  Chief Complaint  Patient presents with  . Constipation  . Urinary Retention     HPI:    80 year old male with past medical history of radical prostatectomy in the early 2000's and subsequent radiation therapy resulting in bladder neck contracture with artificial urinary sphincter placement in 2006, hypertension, hyperlipidemia, diabetes mellitus type 2 who presents to [facility] emergency department with an inability to urinate.  Of note, patient's new artificial sphincter (which replaced the old one in February 2022) was activated 6 days prior to arrival by [facility] urology on 5/25.  Patient explains that since this new sphincter was activated he has been unable to urinate.  This has been associated with increasing abdominal pain, dull in quality, starting in the lower abdomen and radiating diffusely.  Worse with movement and improved with rest.  Patient is also suffering from associated inability to move his bowels.  Over this span of time patient is also suffered from generalized weakness and decreasing appetite.  Upon further questioning patient denies fevers, sick contacts, nausea, vomiting.    Due to continued inability to urinate patient eventually presented to [facility] emergency department for evaluation.  Upon evaluation in the emergency department patient was found to clinically have abdominal distention with severe acute kidney injury with creatinine of 5.45.  Patient was noted to have severe bladder outlet obstruction with bladder scan in the emergency department revealing greater than 962 cc of urine.  Dr. Bernardo [facility] with Urology was contacted by the emergency department provider.   Patient was promptly evaluated the bedside and the patient's artificial sphincter was deactivated resulting in an immediate release of the bladder  outlet obstruction resulting in well over 1 L of urine output.  External condom catheter was then placed for continued urine collection.  Hospitalist group was then called to assess patient for admission the [facility] for management of associated acute kidney injury.  Review of Systems:   Review of Systems  Constitutional: Positive for malaise/fatigue.  Gastrointestinal: Positive for abdominal pain.  Musculoskeletal: Positive for back pain.  All other systems reviewed and are negative.   Past Medical History:  Diagnosis Date  . Anemia   . Bipolar disorder ([place])   . Chronic diarrhea   . Colon polyps   . Depression   . Dermatophytosis   . Diabetic neuropathy ([place])   . DJD (degenerative joint disease)   . Dyspnea   . Gout   . Gross hematuria   . HLD (hyperlipidemia)   . HTN (hypertension)   . Hyperkeratosis   . Onychomycosis   . Parkinson disease (Craig Wilson)   . Prostate cancer ([place])   . Sleep apnea   . Tremor   . Type 2 diabetes mellitus ([place])     Past Surgical History:  Procedure Laterality Date  . cataract surgery Bilateral   . COLONOSCOPY W/ BIOPSIES    . FOOT SURGERY    . PROSTATE SURGERY     removed  . status post implantation of artificial urinary sphincter     . TONSILLECTOMY       reports that he quit smoking about 21 years ago. He has never used smokeless tobacco. He reports that he does not drink alcohol and does not use drugs.  Allergies  Allergen Reactions  . Carbidopa W-Levodopa Other (See Comments)    irrational behavior ,  felt zombie like  . Carbidopa-Levodopa Other (See Comments)    irrational behavior , felt zombie like  . Latex Other (See Comments)    Internal such as catheters. Latex gloves are okay  . Solifenacin Other (See Comments)    Family History  Problem Relation Age of Onset  . Heart disease Mother   . Heart attack Father 4  . Hypertension Father   . Hyperlipidemia Father   . Kidney cancer Neg Hx   . Prostate cancer Neg Hx   .  Bladder Cancer Neg Hx      Prior to Admission medications   Medication Sig Start Date End Date Taking? Authorizing Provider  atorvastatin (LIPITOR) 40 MG tablet Take 40 mg by mouth every evening.   Yes [provider]  carbidopa-levodopa (SINEMET IR) 25-100 MG tablet Take 1 tablet by mouth in the morning and at bedtime.   Yes [provider]  lithium carbonate 300 MG capsule Take 300 mg by mouth 2 (two) times daily.   Yes [provider]  losartan (COZAAR) 50 MG tablet TAKE 1 TABLET BY MOUTH  DAILY Patient taking differently: Take 50 mg by mouth daily. 11/30/19  Yes Gollan, Kathlene November, MD  metFORMIN (GLUCOPHAGE) 500 MG tablet Take 500 mg by mouth 2 (two) times daily with a meal.   Yes [provider]  rOPINIRole (REQUIP) 2 MG tablet Take 2 mg by mouth at bedtime.   Yes [provider]  traZODone (DESYREL) 50 MG tablet Take 50-100 mg by mouth at bedtime.   Yes [provider]    Physical Exam: Vitals:   10/31/20 1337 10/31/20 1600 10/31/20 1800 10/31/20 2106  BP: (!) 141/74 (!) 122/50 136/65 130/61  Pulse: 91  78 83  Resp: 18 18 20 18   Temp: 98.8 F (37.1 C) 98.5 F (36.9 C)  97.9 F (36.6 C)  TempSrc: Oral   Oral  SpO2: 98% 98% 98% 99%    Constitutional: Awake alert and oriented x3, no associated distress.   Skin: no rashes, no lesions, poor skin turgor noted. Eyes: Pupils are equally reactive to light.  No evidence of scleral icterus or conjunctival pallor.  ENMT: Dry eye mucous membranes noted.  Posterior pharynx clear of any exudate or lesions.   Neck: normal, supple, no masses, no thyromegaly.  No evidence of jugular venous distension.   Respiratory: clear to auscultation bilaterally, no wheezing, no crackles. Normal respiratory effort. No accessory muscle use.  Cardiovascular: Regular rate and rhythm, no murmurs / rubs / gallops. No extremity edema. 2+ pedal pulses. No carotid bruits.  Chest:   Nontender without crepitus or  deformity.   Back:   Nontender without crepitus or deformity. Abdomen: Abdomen is somewhat protuberant but soft with mild lower abdominal tenderness.  No evidence of intra-abdominal masses.  Positive bowel sounds noted in all quadrants.   Musculoskeletal: No joint deformity upper and lower extremities. Good ROM, no contractures. Normal muscle tone.  Neurologic: CN 2-12 grossly intact. Sensation intact.  Patient moving all 4 extremities spontaneously.  Patient is following all commands.  Patient is responsive to verbal stimuli.   Psychiatric: Patient exhibits normal mood with appropriate affect.  Patient seems to possess insight as to their current situation.     Labs on Admission: I have personally reviewed following labs and imaging studies -   CBC: Recent Labs  Lab 10/31/20 1412  WBC 20.1*  HGB 10.8*  HCT 32.7*  MCV 98.5  PLT 892   Basic Metabolic  Panel: Recent Labs  Lab 10/31/20 1412 10/31/20 1850 10/31/20 2241  NA 126* 130* 133*  K 4.9 4.6 4.0  CL 91* 96* 97*  CO2 24 22 27   GLUCOSE 260* 153* 166*  BUN 87* 79* 73*  CREATININE 5.45* 4.69* 3.74*  CALCIUM 9.9 10.1 9.6   GFR: CrCl cannot be calculated (Unknown ideal weight.). Liver Function Tests: No results for input(s): AST, ALT, ALKPHOS, BILITOT, PROT, ALBUMIN in the last 168 hours. No results for input(s): LIPASE, AMYLASE in the last 168 hours. No results for input(s): AMMONIA in the last 168 hours. Coagulation Profile: No results for input(s): INR, PROTIME in the last 168 hours. Cardiac Enzymes: No results for input(s): CKTOTAL, CKMB, CKMBINDEX, TROPONINI in the last 168 hours. BNP (last 3 results) No results for input(s): PROBNP in the last 8760 hours. HbA1C: No results for input(s): HGBA1C in the last 72 hours. CBG: Recent Labs  Lab 10/31/20 2148  GLUCAP 220*   Lipid Profile: No results for input(s): CHOL, HDL, LDLCALC, TRIG, CHOLHDL, LDLDIRECT in the last 72 hours. Thyroid Function Tests: No results  for input(s): TSH, T4TOTAL, FREET4, T3FREE, THYROIDAB in the last 72 hours. Anemia Panel: No results for input(s): VITAMINB12, FOLATE, FERRITIN, TIBC, IRON, RETICCTPCT in the last 72 hours. Urine analysis:    Component Value Date/Time   COLORURINE YELLOW (A) 10/31/2020 1502   APPEARANCEUR HAZY (A) 10/31/2020 1502   APPEARANCEUR Clear 03/11/2019 1354   LABSPEC 1.010 10/31/2020 1502   PHURINE 5.0 10/31/2020 1502   GLUCOSEU NEGATIVE 10/31/2020 1502   HGBUR LARGE (A) 10/31/2020 1502   BILIRUBINUR NEGATIVE 10/31/2020 1502   BILIRUBINUR Negative 03/11/2019 1354   KETONESUR NEGATIVE 10/31/2020 1502   PROTEINUR NEGATIVE 10/31/2020 1502   UROBILINOGEN 0.2 03/23/2010 1424   NITRITE NEGATIVE 10/31/2020 1502   LEUKOCYTESUR SMALL (A) 10/31/2020 1502    Radiological Exams on Admission - Personally Reviewed: US RENAL  Result Date: 10/31/2020 CLINICAL DATA:  Renal failure EXAM: RENAL / URINARY TRACT ULTRASOUND COMPLETE COMPARISON:  07/03/2020 FINDINGS: Right Kidney: Renal measurements: 10.8 x 4.9 by 6.6 cm = volume: 181.6 mL. There is increased renal cortical echotexture consistent with medical renal disease. Stable right renal cysts, measuring up to 5.1 x 5.8 x 5.4 cm in the upper pole. No hydronephrosis. Left Kidney: Renal measurements: 12.4 x 5.6 by 6.5 cm = volume: 238.4 mL. Grossly normal echogenicity. Stable 1 cm cyst lateral aspect left kidney. No hydronephrosis. Bladder: Bladder is significantly distended, volume measuring nearly 800 cc. No filling defects or irregularities. Other: None. IMPRESSION: 1. Increased right renal cortical echotexture consistent with medical renal disease. 2. Bilateral renal cysts. 3. Distended urinary bladder without focal abnormality. Electronically Signed   By: Randa Ngo M.D.   On: 10/31/2020 20:22    Assessment/Plan Principal Problem:   Acute kidney injury ([place]) secondary to obstructive uropathy from bladder outlet obstruction   Substantial acute kidney  injury that has likely primarily developed due to obstructive uropathy  Now that primary cause of this obstruction has been addressed renal injury should gradually improve  Strict input and output monitoring  Gentle intravenous hydration  Minimize nephrotoxic agent is much as possible  Monitor renal function and electrolytes with serial chemistries  Obtaining renal ultrasound   Patient is already been evaluated by Dr. Bernardo [facility] with neurology who has deactivated the artificial urinary sphincter -their assistance is appreciated  Per Dr.  Bernardo [facility], patient to have outpatient follow-up with Dr. Matilde Sprang with Urology arranged prior to discharge as it has been difficult  for patient to continue to follow-up with [facility]  Active Problems: Acute cystitis without hematuria   Urinalysis somewhat suggestive of urinary tract infection  Considering advanced renal injury, leukocytosis and low abdominal tenderness on examination will place patient on empiric intravenous antibiotics for now and await final urine cultures  If cultures are negative we will quickly de-escalate/discontinue antibiotics    Hyponatremia   Modest hyponatremia noted secondary to relative volume depletion over the past several days due to poor oral intake  Gentle hydration with intravenous isotonic fluids  Serial chemistries to monitor sodium levels    Parkinson's disease ([place])   Continue home regimen of Sinemet    Essential hypertension   Holding home regimen of Cozaar for now  As needed intravenous antihypertensives for markedly elevated blood pressure.    Hyperlipidemia   Continue home regimen of statin therapy  Diabetes mellitus type 2 without complication without long-term use of insulin   Accu-Cheks before every meal and nightly with slight scale insulin  Hemoglobin A1c pending  Holding home regimen of metformin  Code Status:  Full code Family Communication: deferred   Status is:  Inpatient  Remains inpatient appropriate because:Ongoing diagnostic testing needed not appropriate for outpatient work up, IV treatments appropriate due to intensity of illness or inability to take PO and Inpatient level of care appropriate due to severity of illness   Dispo: The patient is from: Home              Anticipated d/c is to: Home              Patient currently is not medically stable to d/c.   Difficult to place patient No        Vernelle Emerald MD [facility] Pager [phone]  If 7PM-7AM, please contact night-coverage [email or web address] Use universal [place] password for that web site. If you do not have the password, please call the [facility] operator.  11/01/2020, 12:42 AM

## 2020-11-01 NOTE — Plan of Care (Signed)
Pt. Orient and alert. No sign of respiratory distress. Pt. Denies any pain

## 2020-11-01 NOTE — Progress Notes (Signed)
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 Patient's new artificial sphincter (which replaced the old one in February 2022) was activated 6 days prior to arrival by [facility] urology on 5/25. Patient explains that since this new sphincter was activated he has been unable to urinate.  This has been associated with increasing abdominal pain, dull in quality, starting in the lower abdomen and radiating diffusely.  Worse with movement and improved with rest.  Patient is also suffering from associated inability to move his bowels.  Over this span of time patient is also suffered from generalized weakness and decreasing appetite.  Upon evaluation in the emergency department patient was found to clinically have abdominal distention with severe acute kidney injury with creatinine of 5.45.  Patient was noted to have severe bladder outlet obstruction with bladder scan in the emergency department revealing greater than 962 cc of urine.  Dr. Bernardo Heater with Urology was contacted by the emergency department provider.   Patient was promptly evaluated the bedside and the patient's artificial sphincter was deactivated resulting in an immediate release of the bladder outlet obstruction resulting in well over 1 L of urine output.  External condom catheter was then placed for continued urine collection.  Hospitalist group was then called to assess patient for admission the hospital for management of associated acute kidney injury.  New events last 24 hours / Subjective: Patient reports  feeling better, has no physical complaints today.  Denies any chest pain, shortness of breath, nausea, vomiting.  Assessment & Plan:   Principal Problem:   Acute kidney injury ([place]) Active Problems:   Parkinson's disease ([place])   Essential hypertension   Hyperlipidemia   Type 2 diabetes mellitus without complication, without long-term current use of insulin (HCC)   Bladder outlet obstruction   Hyponatremia   Acute cystitis without hematuria   AKI, secondary to obstructive uropathy from bladder outlet obstruction -Improving now that his obstruction has been relieved by urology -Renal ultrasound showed increased right renal cortical echotexture consistent with medical renal disease. Bilateral renal cysts. Distended urinary bladder without focal abnormality. -Follow-up with Dr. Wendy Poet with urology as outpatient  UTI -Urinalysis showed small leukocytes, rare bacteria.  Presented with leukocytosis WBC 20 -Urine culture pending -Continue Rocephin -WBC improving  Parkinson's disease -Continue Sinemet  Essential hypertension -Cozaar on hold due to AKI  Hyperlipidemia -Continue statin  Diabetes mellitus type 2 -Hold metformin in setting of AKI -Continue sliding scale insulin   DVT prophylaxis:  heparin injection 5,000 Units Start: 10/31/20 2200  Code Status:     Code Status Orders  (From admission, onward)         Start     Ordered   10/31/20 1814  Full code  Continuous        10/31/20 1815        Code Status History    Date Active Date Inactive Code Status Order ID Comments User Context   07/02/2016 0203 07/03/2016 1716 Full Code [id number]  Lance Coon, MD Inpatient   Advance Care Planning Activity     Family Communication: No family at bedside  Disposition Plan:  Status is: Inpatient  Remains inpatient appropriate because:Inpatient level of care appropriate due to severity of illness   Dispo: The patient is from: Home              Anticipated d/c is  to: Home              Patient currently is not medically stable to d/c.   Difficult to place patient No      Consultants:   Urology  Procedures:   None  Antimicrobials:  Anti-infectives (From admission, onward)   Start     Dose/Rate Route Frequency Ordered Stop   11/01/20 1500  cefTRIAXone (ROCEPHIN) 1 g in sodium chloride 0.9 % 100 mL IVPB        1 g 200 mL/hr over 30 Minutes Intravenous Every 24 hours 11/01/20 0755     11/01/20 0845  cefTRIAXone (ROCEPHIN) 1 g in sodium chloride 0.9 % 100 mL IVPB  Status:  Discontinued        1 g 200 mL/hr over 30 Minutes Intravenous  Once 11/01/20 0755 11/01/20 0755   10/31/20 1530  cefTRIAXone (ROCEPHIN) 1 g in sodium chloride 0.9 % 100 mL IVPB        1 g 200 mL/hr over 30 Minutes Intravenous  Once 10/31/20 1527 10/31/20 1807        Objective: Vitals:   10/31/20 2106 11/01/20 0345 11/01/20 0753 11/01/20 1204  BP: 130/61 (!) 113/50 (!) 131/46 (!) 123/47  Pulse: 83 72 69 82  Resp: 18  18 18   Temp: 97.9 F (36.6 C) 98.2 F (36.8 C) 97.6 F (36.4 C) 98 F (36.7 C)  TempSrc: Oral Oral Oral Oral  SpO2: 99% 97% 99% 97%    Intake/Output Summary (Last 24 hours) at 11/01/2020 1444 Last data filed at 11/01/2020 1400 Gross per 24 hour  Intake 1216.19 ml  Output 1700 ml  Net -483.81 ml   There were no vitals filed for this visit.  Examination:  General exam: Appears calm and comfortable  Respiratory system: Clear to auscultation. Respiratory effort normal. No respiratory distress. No conversational dyspnea.  Cardiovascular system: S1 & S2 heard, RRR. No murmurs. No pedal edema. Gastrointestinal system: Abdomen is nondistended, soft and nontender. Normal bowel sounds heard. Central nervous system: Alert and oriented. No focal neurological deficits. Speech clear.  Extremities: Symmetric in appearance  Skin: No rashes, lesions or ulcers on exposed skin  Psychiatry: Judgement and insight appear normal. Mood & affect appropriate.    Data Reviewed: I have personally reviewed following labs and imaging studies  CBC: Recent Labs  Lab 10/31/20 1412 11/01/20 0201  WBC 20.1* 15.0*  NEUTROABS  --  12.7*  HGB 10.8* 10.5*  HCT 32.7* 31.1*  MCV 98.5 99.4  PLT 177 161   Basic Metabolic Panel: Recent Labs  Lab 10/31/20 1412 10/31/20 1850 10/31/20 2241 11/01/20 0201 11/01/20 0525  NA 126* 130* 133* 135 134*  K 4.9 4.6 4.0 4.0 4.0  CL 91* 96* 97* 102 102  CO2 24 22 27 26 25   GLUCOSE 260* 153* 166* 81 128*  BUN 87* 79* 73* 73* 68*  CREATININE 5.45* 4.69* 3.74* 3.27* 2.76*  CALCIUM 9.9 10.1 9.6 9.7 9.4  MG  --   --   --  3.7*  --    GFR: CrCl cannot be calculated (Unknown ideal weight.). Liver Function Tests: Recent Labs  Lab 11/01/20 0201  AST 11*  ALT 15  ALKPHOS 63  BILITOT 0.7  PROT  5.8*  ALBUMIN 2.7*   No results for input(s): LIPASE, AMYLASE in the last 168 hours. No results for input(s): AMMONIA in the last 168 hours. Coagulation Profile: No results for input(s): INR, PROTIME in the last 168 hours. Cardiac Enzymes: No results for input(s): CKTOTAL, CKMB, CKMBINDEX, TROPONINI in the last 168 hours. BNP (last 3 results) No results for input(s): PROBNP in the last 8760 hours. HbA1C: No results for input(s): HGBA1C in the last 72 hours. CBG: Recent Labs  Lab 10/31/20 2148 11/01/20 0751 11/01/20 1147  GLUCAP 220* 120* 202*   Lipid Profile: No results for input(s): CHOL, HDL, LDLCALC, TRIG, CHOLHDL, LDLDIRECT in the last 72 hours. Thyroid Function Tests: No results for input(s): TSH, T4TOTAL, FREET4, T3FREE, THYROIDAB in the last 72 hours. Anemia Panel: No results for input(s): VITAMINB12, FOLATE, FERRITIN, TIBC, IRON, RETICCTPCT in the last 72 hours. Sepsis Labs: No results for input(s): PROCALCITON, LATICACIDVEN in the last 168 hours.  Recent Results (from the past 240 hour(s))  SARS CORONAVIRUS 2 (TAT 6-24 HRS) Nasopharyngeal Nasopharyngeal Swab     Status: None   Collection  Time: 10/31/20  3:24 PM   Specimen: Nasopharyngeal Swab  Result Value Ref Range Status   SARS Coronavirus 2 NEGATIVE NEGATIVE Final    Comment: (NOTE) SARS-CoV-2 target nucleic acids are NOT DETECTED.  The SARS-CoV-2 RNA is generally detectable in upper and lower respiratory specimens during the acute phase of infection. Negative results do not preclude SARS-CoV-2 infection, do not rule out co-infections with other pathogens, and should not be used as the sole basis for treatment or other patient management decisions. Negative results must be combined with clinical observations, patient history, and epidemiological information. The expected result is Negative.  Fact Sheet for Patients: SugarRoll.be  Fact Sheet for Healthcare Providers: https://www.woods-mathews.com/  This test is not yet approved or cleared by the [place] FDA and  has been authorized for detection and/or diagnosis of SARS-CoV-2 by FDA under an Emergency Use Authorization (EUA). This EUA will remain  in effect (meaning this test can be used) for the duration of the COVID-19 declaration under Se ction 564(b)(1) of the Act, 21 U.S.C. section 360bbb-3(b)(1), unless the authorization is terminated or revoked sooner.  Performed at [facility], [place] [address], [place], [place] [address]       Radiology Studies: US RENAL  Result Date: 10/31/2020 CLINICAL DATA:  Renal failure EXAM: RENAL / URINARY TRACT ULTRASOUND COMPLETE COMPARISON:  07/03/2020 FINDINGS: Right Kidney: Renal measurements: 10.8 x 4.9 by 6.6 cm = volume: 181.6 mL. There is increased renal cortical echotexture consistent with medical renal disease. Stable right renal cysts, measuring up to 5.1 x 5.8 x 5.4 cm in the upper pole. No hydronephrosis. Left Kidney: Renal measurements: 12.4 x 5.6 by 6.5 cm = volume: 238.4 mL. Grossly normal echogenicity. Stable 1 cm cyst lateral aspect left kidney. No  hydronephrosis. Bladder: Bladder is significantly distended, volume measuring nearly 800 cc. No filling defects or irregularities. Other: None. IMPRESSION: 1. Increased right renal cortical echotexture consistent with medical renal disease. 2. Bilateral renal cysts. 3. Distended urinary bladder without focal abnormality. Electronically Signed   By: Randa Ngo M.D.   On: 10/31/2020 20:22      Scheduled Meds: . atorvastatin  40 mg Oral QPM  . carbidopa-levodopa  1 tablet Oral BID  . heparin  5,000 Units Subcutaneous Q8H  . insulin aspart  0-15 Units Subcutaneous TID AC & HS  . rOPINIRole  2 mg Oral QHS  .  traZODone  50 mg Oral QHS   Continuous Infusions: . cefTRIAXone (ROCEPHIN)  IV       LOS: 1 day      Time spent: 25 minutes   Dessa Phi, DO [facility] 11/01/2020, 2:44 PM   Available via Epic secure chat 7am-7pm After these hours, please refer to coverage provider listed on [email or web address]

## 2020-11-01 NOTE — Plan of Care (Signed)

## 2020-11-02 ENCOUNTER — Telehealth: Payer: Self-pay | Admitting: Urology

## 2020-11-02 DIAGNOSIS — N179 Acute kidney failure, unspecified: Secondary | ICD-10-CM | POA: Diagnosis not present

## 2020-11-02 LAB — CBC
HCT: 30.4 % — ABNORMAL LOW (ref 39.0–52.0)
Hemoglobin: 9.7 g/dL — ABNORMAL LOW (ref 13.0–17.0)
MCH: 32.2 pg (ref 26.0–34.0)
MCHC: 31.9 g/dL (ref 30.0–36.0)
MCV: 101 fL — ABNORMAL HIGH (ref 80.0–100.0)
Platelets: 153 10*3/uL (ref 150–400)
RBC: 3.01 MIL/uL — ABNORMAL LOW (ref 4.22–5.81)
RDW: 12.1 % (ref 11.5–15.5)
WBC: 6.8 10*3/uL (ref 4.0–10.5)
nRBC: 0 % (ref 0.0–0.2)

## 2020-11-02 LAB — BASIC METABOLIC PANEL
Anion gap: 6 (ref 5–15)
BUN: 45 mg/dL — ABNORMAL HIGH (ref 8–23)
CO2: 25 mmol/L (ref 22–32)
Calcium: 9.4 mg/dL (ref 8.9–10.3)
Chloride: 108 mmol/L (ref 98–111)
Creatinine, Ser: 1.3 mg/dL — ABNORMAL HIGH (ref 0.61–1.24)
GFR, Estimated: 55 mL/min — ABNORMAL LOW (ref >60)
Glucose, Bld: 147 mg/dL — ABNORMAL HIGH (ref 70–99)
Potassium: 4.3 mmol/L (ref 3.5–5.1)
Sodium: 139 mmol/L (ref 135–145)

## 2020-11-02 LAB — HEMOGLOBIN A1C
Hgb A1c MFr Bld: 5.6 % (ref 4.8–5.6)
Mean Plasma Glucose: 114 mg/dL

## 2020-11-02 LAB — GLUCOSE, CAPILLARY: Glucose-Capillary: 149 mg/dL — ABNORMAL HIGH (ref 70–99)

## 2020-11-02 LAB — URINE CULTURE

## 2020-11-02 MED ORDER — POLYETHYLENE GLYCOL 3350 17 G PO PACK
17.0000 g | PACK | Freq: Once | ORAL | Status: AC
  Administered 2020-11-02: 17 g via ORAL
  Filled 2020-11-02: qty 1

## 2020-11-02 NOTE — Telephone Encounter (Signed)
Called patient to schedule a hospital follow up app for him with Dr. Erlene Quan. Had to leave a [employer]

## 2020-11-02 NOTE — Plan of Care (Signed)

## 2020-11-02 NOTE — Discharge Summary (Signed)
Physician Discharge Summary  Craig Wilson:[id number] DOB: 07/18/1940 DOA: 10/31/2020  PCP: Adin Hector, MD  Admit date: 10/31/2020 Discharge date: 11/02/2020  Admitted From: Home Disposition:  Home  Recommendations for Outpatient Follow-up:  1. Follow up with PCP in 1 week 2. Follow up with Urology Dr. Matilde Sprang 3. Repeat BMP in 1 week. 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 Patient's new artificial sphincter(which replaced the old one in February 2022)was activated 6 days prior to arrival [facility] on 5/25. Patient explains that since this new sphincter was activated he has been unable to urinate. This has been associated with increasing abdominal pain, dull in quality, starting in the lower abdomen and radiating diffusely. Worse with movement and improved with rest. Patient is also suffering from associated inability to move his bowels. Over thisspan of time patient is also suffered from generalized weakness and decreasing appetite.  Upon evaluation in the emergency department patient was found to clinically have abdominal distention with severe acute kidney injury with creatinine of 5.45. Patient was noted to have severe bladder outlet obstruction with bladder scan in the emergency department revealing greater than 962 cc of urine. Dr. Bernardo Heater with Urologywas contacted by the emergency department provider.Patient was promptly evaluated the bedside and the  patient's artificial sphincter was deactivated resulting in an immediate release of the bladder outlet obstruction resulting in well over 1 L of urine output. External condom catheter was then placed for continued urine collection. Hospitalist group was then called to assess patient for admission the hospital for management of associated acute kidney injury.  Patient's AKI continued to improve 5.45 --> 1.3 and WBC improved 20.1 --> 6.8. He was treated for suspected UTI with rocephin. Urine culture revealed multiple species. He remained afebrile, feeling well, without any new symptoms or complaints. He remained stable for discharge home.   Discharge Diagnoses:  Principal Problem:   Acute kidney injury ([place]) Active Problems:   Parkinson's disease ([place])   Essential hypertension   Hyperlipidemia   Type 2 diabetes mellitus without complication, without long-term current use of insulin (HCC)   Bladder outlet obstruction   Hyponatremia   Acute cystitis without hematuria   AKI, secondary to obstructive uropathy from bladder outlet obstruction -Improving now that his obstruction has been relieved by urology -Renal ultrasound showed increased right renal cortical echotexture consistent with medical renal disease. Bilateral renal cysts. Distended urinary bladder without focal abnormality. -Follow-up with Dr. Wendy Poet with urology as outpatient  Asymptomatic pyuria  -Urinalysis showed small leukocytes, rare bacteria.  Presented with leukocytosis WBC 20 -Urine culture multiple species -Leukocytosis resolved -Stop rocephin   Parkinson's disease -Continue Sinemet  Essential hypertension -Cozaar on hold due to AKI  Hyperlipidemia -Continue statin  Diabetes mellitus type 2 -Hold metformin in setting of AKI -Continue sliding scale insulin   Discharge Instructions  Discharge Instructions    Call MD for:  difficulty breathing, headache or visual disturbances   Complete by: As  directed    Call MD for:  extreme fatigue   Complete by: As directed  Call MD for:  persistant dizziness or light-headedness   Complete by: As directed    Call MD for:  persistant nausea and vomiting   Complete by: As directed    Call MD for:  severe uncontrolled pain   Complete by: As directed    Call MD for:  temperature >100.4   Complete by: As directed    Discharge instructions   Complete by: As directed    Cozaar and metformin on hold until follow up with PCP. May resume as long as kidney function remains improved and stable, as directed by PCP.   You were cared for by a hospitalist during your hospital stay. If you have any questions about your discharge medications or the care you received while you were in the hospital after you are discharged, you can call the unit and ask to speak with the hospitalist on call if the hospitalist that took care of you is not available. Once you are discharged, your primary care physician will handle any further medical issues. Please note that NO REFILLS for any discharge medications will be authorized once you are discharged, as it is imperative that you return to your primary care physician (or establish a relationship with a primary care physician if you do not have one) for your aftercare needs so that they can reassess your need for medications and monitor your lab values.   Increase activity slowly   Complete by: As directed      Allergies as of 11/02/2020      Reactions   Carbidopa W-levodopa Other (See Comments)   irrational behavior , felt zombie like   Carbidopa-levodopa Other (See Comments)   irrational behavior , felt zombie like   Latex Other (See Comments)   Internal such as catheters. Latex gloves are okay   Solifenacin Other (See Comments)      Medication List    STOP taking these medications   losartan 50 MG tablet Commonly known as: COZAAR   metFORMIN 500 MG tablet Commonly known as: GLUCOPHAGE     TAKE these  medications   atorvastatin 40 MG tablet Commonly known as: LIPITOR Take 40 mg by mouth every evening.   carbidopa-levodopa 25-100 MG tablet Commonly known as: SINEMET IR Take 1 tablet by mouth in the morning and at bedtime.   lithium carbonate 300 MG capsule Take 300 mg by mouth 2 (two) times daily.   rOPINIRole 2 MG tablet Commonly known as: REQUIP Take 2 mg by mouth at bedtime.   traZODone 50 MG tablet Commonly known as: DESYREL Take 50-100 mg by mouth at bedtime.       Allergies  Allergen Reactions  . Carbidopa W-Levodopa Other (See Comments)    irrational behavior , felt zombie like  . Carbidopa-Levodopa Other (See Comments)    irrational behavior , felt zombie like  . Latex Other (See Comments)    Internal such as catheters. Latex gloves are okay  . Solifenacin Other (See Comments)    Consultations:  Urology    Procedures/Studies: US RENAL  Result Date: 10/31/2020 CLINICAL DATA:  Renal failure EXAM: RENAL / URINARY TRACT ULTRASOUND COMPLETE COMPARISON:  07/03/2020 FINDINGS: Right Kidney: Renal measurements: 10.8 x 4.9 by 6.6 cm = volume: 181.6 mL. There is increased renal cortical echotexture consistent with medical renal disease. Stable right renal cysts, measuring up to 5.1 x 5.8 x 5.4 cm in the upper pole. No hydronephrosis. Left Kidney: Renal measurements: 12.4 x 5.6 by 6.5 cm = volume: 238.4 mL.  Grossly normal echogenicity. Stable 1 cm cyst lateral aspect left kidney. No hydronephrosis. Bladder: Bladder is significantly distended, volume measuring nearly 800 cc. No filling defects or irregularities. Other: None. IMPRESSION: 1. Increased right renal cortical echotexture consistent with medical renal disease. 2. Bilateral renal cysts. 3. Distended urinary bladder without focal abnormality. Electronically Signed   By: Randa Ngo M.D.   On: 10/31/2020 20:22       Discharge Exam: Vitals:   11/02/20 0516 11/02/20 0835  BP: (!) 143/60 (!) 149/55  Pulse: 75 80   Resp: 16 16  Temp: 97.8 F (36.6 C) (!) 97 F (36.1 C)  SpO2: 94% 94%    General: Pt is alert, awake, not in acute distress Cardiovascular: RRR, S1/S2 +, no edema Respiratory: CTA bilaterally, no wheezing, no rhonchi, no respiratory distress, no conversational dyspnea  Abdominal: Soft, NT, ND, bowel sounds + Extremities: no edema, no cyanosis Psych: Normal mood and affect, stable judgement and insight     The results of significant diagnostics from this hospitalization (including imaging, microbiology, ancillary and laboratory) are listed below for reference.     Microbiology: Recent Results (from the past 240 hour(s))  Urine culture     Status: Abnormal   Collection Time: 10/31/20  3:02 PM   Specimen: Urine, Random  Result Value Ref Range Status   Specimen Description   Final    URINE, RANDOM Performed at [facility], [address], [place], [place] [address]    Special Requests   Final    NONE Performed at [facility], [address] [place], [place] [address]    Culture MULTIPLE SPECIES PRESENT, SUGGEST RECOLLECTION (A)  Final   Report Status 11/02/2020 FINAL  Final  SARS CORONAVIRUS 2 (TAT 6-24 HRS) Nasopharyngeal Nasopharyngeal Swab     Status: None   Collection Time: 10/31/20  3:24 PM   Specimen: Nasopharyngeal Swab  Result Value Ref Range Status   SARS Coronavirus 2 NEGATIVE NEGATIVE Final    Comment: (NOTE) SARS-CoV-2 target nucleic acids are NOT DETECTED.  The SARS-CoV-2 RNA is generally detectable in upper and lower respiratory specimens during the acute phase of infection. Negative results do not preclude SARS-CoV-2 infection, do not rule out co-infections with other pathogens, and should not be used as the sole basis for treatment or other patient management decisions. Negative results must be combined with clinical observations, patient history, and epidemiological information. The expected result is Negative.  Fact Sheet for  Patients: SugarRoll.be  Fact Sheet for Healthcare Providers: https://www.woods-mathews.com/  This test is not yet approved or cleared by the [place] FDA and  has been authorized for detection and/or diagnosis of SARS-CoV-2 by FDA under an Emergency Use Authorization (EUA). This EUA will remain  in effect (meaning this test can be used) for the duration of the COVID-19 declaration under Se ction 564(b)(1) of the Act, 21 U.S.C. section 360bbb-3(b)(1), unless the authorization is terminated or revoked sooner.  Performed at [facility], [place] [address], [place], [place] [address]      Labs: BNP (last 3 results) No results for input(s): BNP in the last 8760 hours. Basic Metabolic Panel: Recent Labs  Lab 10/31/20 1850 10/31/20 2241 11/01/20 0201 11/01/20 0525 11/02/20 0405  NA 130* 133* 135 134* 139  K 4.6 4.0 4.0 4.0 4.3  CL 96* 97* 102 102 108  CO2 22 27 26 25 25   GLUCOSE 153* 166* 81 128* 147*  BUN 79* 73* 73* 68* 45*  CREATININE  4.69* 3.74* 3.27* 2.76* 1.30*  CALCIUM 10.1 9.6 9.7 9.4 9.4  MG  --   --  3.7*  --   --    Liver Function Tests: Recent Labs  Lab 11/01/20 0201  AST 11*  ALT 15  ALKPHOS 63  BILITOT 0.7  PROT 5.8*  ALBUMIN 2.7*   No results for input(s): LIPASE, AMYLASE in the last 168 hours. No results for input(s): AMMONIA in the last 168 hours. CBC: Recent Labs  Lab 10/31/20 1412 11/01/20 0201 11/02/20 0405  WBC 20.1* 15.0* 6.8  NEUTROABS  --  12.7*  --   HGB 10.8* 10.5* 9.7*  HCT 32.7* 31.1* 30.4*  MCV 98.5 99.4 101.0*  PLT 177 184 153   Cardiac Enzymes: No results for input(s): CKTOTAL, CKMB, CKMBINDEX, TROPONINI in the last 168 hours. BNP: Invalid input(s): POCBNP CBG: Recent Labs  Lab 11/01/20 0751 11/01/20 1147 11/01/20 1626 11/01/20 2124 11/02/20 0729  GLUCAP 120* 202* 141* 142* 149*   D-Dimer No results for input(s): DDIMER in the last 72 hours. Hgb A1c Recent Labs     10/31/20 1412  HGBA1C 5.6   Lipid Profile No results for input(s): CHOL, HDL, LDLCALC, TRIG, CHOLHDL, LDLDIRECT in the last 72 hours. Thyroid function studies No results for input(s): TSH, T4TOTAL, T3FREE, THYROIDAB in the last 72 hours.  Invalid input(s): FREET3 Anemia work up No results for input(s): VITAMINB12, FOLATE, FERRITIN, TIBC, IRON, RETICCTPCT in the last 72 hours. Urinalysis    Component Value Date/Time   COLORURINE YELLOW (A) 10/31/2020 1502   APPEARANCEUR HAZY (A) 10/31/2020 1502   APPEARANCEUR Clear 03/11/2019 1354   LABSPEC 1.010 10/31/2020 1502   PHURINE 5.0 10/31/2020 1502   GLUCOSEU NEGATIVE 10/31/2020 1502   HGBUR LARGE (A) 10/31/2020 1502   BILIRUBINUR NEGATIVE 10/31/2020 1502   BILIRUBINUR Negative 03/11/2019 1354   KETONESUR NEGATIVE 10/31/2020 1502   PROTEINUR NEGATIVE 10/31/2020 1502   UROBILINOGEN 0.2 03/23/2010 1424   NITRITE NEGATIVE 10/31/2020 1502   LEUKOCYTESUR SMALL (A) 10/31/2020 1502   Sepsis Labs Invalid input(s): PROCALCITONIN,  WBC,  LACTICIDVEN Microbiology Recent Results (from the past 240 hour(s))  Urine culture     Status: Abnormal   Collection Time: 10/31/20  3:02 PM   Specimen: Urine, Random  Result Value Ref Range Status   Specimen Description   Final    URINE, RANDOM Performed at [facility], [address], [place], [place] [address]    Special Requests   Final    NONE Performed at [facility], [address] [place], [place] [address]    Culture MULTIPLE SPECIES PRESENT, SUGGEST RECOLLECTION (A)  Final   Report Status 11/02/2020 FINAL  Final  SARS CORONAVIRUS 2 (TAT 6-24 HRS) Nasopharyngeal Nasopharyngeal Swab     Status: None   Collection Time: 10/31/20  3:24 PM   Specimen: Nasopharyngeal Swab  Result Value Ref Range Status   SARS Coronavirus 2 NEGATIVE NEGATIVE Final    Comment: (NOTE) SARS-CoV-2 target nucleic acids are NOT DETECTED.  The SARS-CoV-2 RNA is generally detectable in upper  and lower respiratory specimens during the acute phase of infection. Negative results do not preclude SARS-CoV-2 infection, do not rule out co-infections with other pathogens, and should not be used as the sole basis for treatment or other patient management decisions. Negative results must be combined with clinical observations, patient history, and epidemiological information. The expected result is Negative.  Fact Sheet for Patients: SugarRoll.be  Fact Sheet for Healthcare Providers: https://www.woods-mathews.com/  This test  is not yet approved or cleared by the [place] and  has been authorized for detection and/or diagnosis of SARS-CoV-2 by FDA under an Emergency Use Authorization (EUA). This EUA will remain  in effect (meaning this test can be used) for the duration of the COVID-19 declaration under Se ction 564(b)(1) of the Act, 21 U.S.C. section 360bbb-3(b)(1), unless the authorization is terminated or revoked sooner.  Performed at [facility], [place] [address] [place], [place] [address]      Patient was seen and examined on the day of discharge and was found to be in stable condition. Time coordinating discharge: 25 minutes including assessment and coordination of care, as well as examination of the patient.   SIGNED:  Dessa Phi, DO [facility] 11/02/2020, 8:57 AM

## 2020-11-03 NOTE — Telephone Encounter (Signed)
1 week hospital follow up appt made with Dr. Erlene Quan. I assured the patient's wife that patient has been under the care of Dr. Erlene Quan here in the [place] office.  They have also been seen at [facility].  Patient's wife states that they are 80 years old and cannot continue to go out of town to the [employer] office.   Additional appointment made to follow up with Dr. Matilde Sprang at the patient's wife's request.   Please advise if this appropriate.

## 2020-11-03 NOTE — Telephone Encounter (Signed)
Patient's wife called the office today.  She is requesting a follow up with Dr. Matilde Sprang.  Patient has an artifical sphinter and has been managed by Dr. Matilde Sprang in [place].  Appt made and communicated to patient.

## 2020-11-05 ENCOUNTER — Inpatient Hospital Stay
Admission: EM | Admit: 2020-11-05 | Discharge: 2020-11-09 | DRG: 871 | Disposition: A | Discharge status: Skilled Nursing Facility | Payer: Medicare Other | Attending: Family Medicine | Admitting: Family Medicine

## 2020-11-05 ENCOUNTER — Inpatient Hospital Stay: Payer: Medicare Other

## 2020-11-05 ENCOUNTER — Emergency Department: Payer: Medicare Other

## 2020-11-05 ENCOUNTER — Other Ambulatory Visit: Payer: Self-pay

## 2020-11-05 DIAGNOSIS — G928 Other toxic encephalopathy: Secondary | ICD-10-CM | POA: Diagnosis present

## 2020-11-05 DIAGNOSIS — Z20822 Contact with and (suspected) exposure to covid-19: Secondary | ICD-10-CM | POA: Diagnosis present

## 2020-11-05 DIAGNOSIS — I5021 Acute systolic (congestive) heart failure: Secondary | ICD-10-CM | POA: Diagnosis present

## 2020-11-05 DIAGNOSIS — G4733 Obstructive sleep apnea (adult) (pediatric): Secondary | ICD-10-CM | POA: Diagnosis present

## 2020-11-05 DIAGNOSIS — Z79899 Other long term (current) drug therapy: Secondary | ICD-10-CM | POA: Diagnosis not present

## 2020-11-05 DIAGNOSIS — Z923 Personal history of irradiation: Secondary | ICD-10-CM

## 2020-11-05 DIAGNOSIS — R338 Other retention of urine: Secondary | ICD-10-CM | POA: Diagnosis not present

## 2020-11-05 DIAGNOSIS — Z9079 Acquired absence of other genital organ(s): Secondary | ICD-10-CM

## 2020-11-05 DIAGNOSIS — Z83438 Family history of other disorder of lipoprotein metabolism and other lipidemia: Secondary | ICD-10-CM

## 2020-11-05 DIAGNOSIS — R6521 Severe sepsis with septic shock: Secondary | ICD-10-CM | POA: Diagnosis present

## 2020-11-05 DIAGNOSIS — Z8249 Family history of ischemic heart disease and other diseases of the circulatory system: Secondary | ICD-10-CM

## 2020-11-05 DIAGNOSIS — Z91040 Latex allergy status: Secondary | ICD-10-CM

## 2020-11-05 DIAGNOSIS — W19XXXA Unspecified fall, initial encounter: Secondary | ICD-10-CM | POA: Diagnosis not present

## 2020-11-05 DIAGNOSIS — I130 Hypertensive heart and chronic kidney disease with heart failure and stage 1 through stage 4 chronic kidney disease, or unspecified chronic kidney disease: Secondary | ICD-10-CM | POA: Diagnosis present

## 2020-11-05 DIAGNOSIS — M1990 Unspecified osteoarthritis, unspecified site: Secondary | ICD-10-CM | POA: Diagnosis present

## 2020-11-05 DIAGNOSIS — N179 Acute kidney failure, unspecified: Secondary | ICD-10-CM | POA: Diagnosis present

## 2020-11-05 DIAGNOSIS — M109 Gout, unspecified: Secondary | ICD-10-CM | POA: Diagnosis present

## 2020-11-05 DIAGNOSIS — D638 Anemia in other chronic diseases classified elsewhere: Secondary | ICD-10-CM | POA: Diagnosis present

## 2020-11-05 DIAGNOSIS — R339 Retention of urine, unspecified: Secondary | ICD-10-CM | POA: Diagnosis present

## 2020-11-05 DIAGNOSIS — J690 Pneumonitis due to inhalation of food and vomit: Secondary | ICD-10-CM | POA: Diagnosis present

## 2020-11-05 DIAGNOSIS — I4510 Unspecified right bundle-branch block: Secondary | ICD-10-CM | POA: Diagnosis present

## 2020-11-05 DIAGNOSIS — N181 Chronic kidney disease, stage 1: Secondary | ICD-10-CM | POA: Diagnosis present

## 2020-11-05 DIAGNOSIS — E1140 Type 2 diabetes mellitus with diabetic neuropathy, unspecified: Secondary | ICD-10-CM | POA: Diagnosis present

## 2020-11-05 DIAGNOSIS — A419 Sepsis, unspecified organism: Secondary | ICD-10-CM | POA: Diagnosis present

## 2020-11-05 DIAGNOSIS — R7989 Other specified abnormal findings of blood chemistry: Secondary | ICD-10-CM | POA: Diagnosis not present

## 2020-11-05 DIAGNOSIS — G9341 Metabolic encephalopathy: Secondary | ICD-10-CM | POA: Diagnosis not present

## 2020-11-05 DIAGNOSIS — J9601 Acute respiratory failure with hypoxia: Secondary | ICD-10-CM | POA: Diagnosis present

## 2020-11-05 DIAGNOSIS — J189 Pneumonia, unspecified organism: Secondary | ICD-10-CM | POA: Diagnosis present

## 2020-11-05 DIAGNOSIS — R778 Other specified abnormalities of plasma proteins: Secondary | ICD-10-CM | POA: Diagnosis not present

## 2020-11-05 DIAGNOSIS — R6520 Severe sepsis without septic shock: Secondary | ICD-10-CM | POA: Diagnosis not present

## 2020-11-05 DIAGNOSIS — Y92009 Unspecified place in unspecified non-institutional (private) residence as the place of occurrence of the external cause: Secondary | ICD-10-CM | POA: Diagnosis not present

## 2020-11-05 DIAGNOSIS — R4182 Altered mental status, unspecified: Secondary | ICD-10-CM

## 2020-11-05 DIAGNOSIS — Z8546 Personal history of malignant neoplasm of prostate: Secondary | ICD-10-CM

## 2020-11-05 DIAGNOSIS — Z888 Allergy status to other drugs, medicaments and biological substances status: Secondary | ICD-10-CM

## 2020-11-05 DIAGNOSIS — G20 Parkinson's disease: Secondary | ICD-10-CM | POA: Diagnosis present

## 2020-11-05 DIAGNOSIS — E785 Hyperlipidemia, unspecified: Secondary | ICD-10-CM | POA: Diagnosis present

## 2020-11-05 DIAGNOSIS — J9602 Acute respiratory failure with hypercapnia: Secondary | ICD-10-CM | POA: Diagnosis present

## 2020-11-05 DIAGNOSIS — Z87891 Personal history of nicotine dependence: Secondary | ICD-10-CM

## 2020-11-05 DIAGNOSIS — R32 Unspecified urinary incontinence: Secondary | ICD-10-CM | POA: Diagnosis not present

## 2020-11-05 DIAGNOSIS — I214 Non-ST elevation (NSTEMI) myocardial infarction: Secondary | ICD-10-CM | POA: Diagnosis present

## 2020-11-05 DIAGNOSIS — E1122 Type 2 diabetes mellitus with diabetic chronic kidney disease: Secondary | ICD-10-CM | POA: Diagnosis present

## 2020-11-05 DIAGNOSIS — Z4659 Encounter for fitting and adjustment of other gastrointestinal appliance and device: Secondary | ICD-10-CM

## 2020-11-05 LAB — CBC WITH DIFFERENTIAL/PLATELET
Abs Immature Granulocytes: 0.46 10*3/uL — ABNORMAL HIGH (ref 0.00–0.07)
Basophils Absolute: 0.1 10*3/uL (ref 0.0–0.1)
Basophils Relative: 1 %
Eosinophils Absolute: 0.2 10*3/uL (ref 0.0–0.5)
Eosinophils Relative: 1 %
HCT: 34.9 % — ABNORMAL LOW (ref 39.0–52.0)
Hemoglobin: 11.1 g/dL — ABNORMAL LOW (ref 13.0–17.0)
Immature Granulocytes: 2 %
Lymphocytes Relative: 3 %
Lymphs Abs: 0.9 10*3/uL (ref 0.7–4.0)
MCH: 32.9 pg (ref 26.0–34.0)
MCHC: 31.8 g/dL (ref 30.0–36.0)
MCV: 103.6 fL — ABNORMAL HIGH (ref 80.0–100.0)
Monocytes Absolute: 1.1 10*3/uL — ABNORMAL HIGH (ref 0.1–1.0)
Monocytes Relative: 4 %
Neutro Abs: 28.1 10*3/uL — ABNORMAL HIGH (ref 1.7–7.7)
Neutrophils Relative %: 89 %
Platelets: 278 10*3/uL (ref 150–400)
RBC: 3.37 MIL/uL — ABNORMAL LOW (ref 4.22–5.81)
RDW: 11.9 % (ref 11.5–15.5)
Smear Review: NORMAL
WBC: 30.9 10*3/uL — ABNORMAL HIGH (ref 4.0–10.5)
nRBC: 0 % (ref 0.0–0.2)

## 2020-11-05 LAB — BLOOD GAS, ARTERIAL
Acid-base deficit: 3.2 mmol/L — ABNORMAL HIGH (ref 0.0–2.0)
Bicarbonate: 24.2 mmol/L (ref 20.0–28.0)
FIO2: 60
MECHVT: 450 mL
Mechanical Rate: 20
O2 Saturation: 94.3 %
PEEP: 5 cmH2O
Patient temperature: 37
RATE: 20 resp/min
pCO2 arterial: 54 mmHg — ABNORMAL HIGH (ref 32.0–48.0)
pH, Arterial: 7.26 — ABNORMAL LOW (ref 7.350–7.450)
pO2, Arterial: 83 mmHg (ref 83.0–108.0)

## 2020-11-05 LAB — TROPONIN I (HIGH SENSITIVITY)
Troponin I (High Sensitivity): 92 ng/L — ABNORMAL HIGH (ref <18)
Troponin I (High Sensitivity): 138 ng/L (ref <18)
Troponin I (High Sensitivity): 67 ng/L — ABNORMAL HIGH (ref <18)

## 2020-11-05 LAB — COMPREHENSIVE METABOLIC PANEL
ALT: 12 U/L (ref 0–44)
AST: 16 U/L (ref 15–41)
Albumin: 3.3 g/dL — ABNORMAL LOW (ref 3.5–5.0)
Alkaline Phosphatase: 81 U/L (ref 38–126)
Anion gap: 11 (ref 5–15)
BUN: 27 mg/dL — ABNORMAL HIGH (ref 8–23)
CO2: 23 mmol/L (ref 22–32)
Calcium: 9.9 mg/dL (ref 8.9–10.3)
Chloride: 103 mmol/L (ref 98–111)
Creatinine, Ser: 1.32 mg/dL — ABNORMAL HIGH (ref 0.61–1.24)
GFR, Estimated: 54 mL/min — ABNORMAL LOW (ref >60)
Glucose, Bld: 334 mg/dL — ABNORMAL HIGH (ref 70–99)
Potassium: 4.2 mmol/L (ref 3.5–5.1)
Sodium: 137 mmol/L (ref 135–145)
Total Bilirubin: 0.6 mg/dL (ref 0.3–1.2)
Total Protein: 6.6 g/dL (ref 6.5–8.1)

## 2020-11-05 LAB — GLUCOSE, CAPILLARY
Glucose-Capillary: 115 mg/dL — ABNORMAL HIGH (ref 70–99)
Glucose-Capillary: 71 mg/dL (ref 70–99)
Glucose-Capillary: 129 mg/dL — ABNORMAL HIGH (ref 70–99)
Glucose-Capillary: 138 mg/dL — ABNORMAL HIGH (ref 70–99)

## 2020-11-05 LAB — STREP PNEUMONIAE URINARY ANTIGEN: Strep Pneumo Urinary Antigen: NEGATIVE

## 2020-11-05 LAB — URINALYSIS, COMPLETE (UACMP) WITH MICROSCOPIC
Bacteria, UA: NONE SEEN
Bilirubin Urine: NEGATIVE
Glucose, UA: 50 mg/dL — AB
Hgb urine dipstick: NEGATIVE
Ketones, ur: NEGATIVE mg/dL
Leukocytes,Ua: NEGATIVE
Nitrite: NEGATIVE
Protein, ur: 30 mg/dL — AB
Specific Gravity, Urine: 1.014 (ref 1.005–1.030)
Squamous Epithelial / HPF: NONE SEEN (ref 0–5)
pH: 6 (ref 5.0–8.0)

## 2020-11-05 LAB — RESP PANEL BY RT-PCR (FLU A&B, COVID) ARPGX2
Influenza A by PCR: NEGATIVE
Influenza B by PCR: NEGATIVE
SARS Coronavirus 2 by RT PCR: NEGATIVE

## 2020-11-05 LAB — PROCALCITONIN: Procalcitonin: 0.41 ng/mL

## 2020-11-05 LAB — MRSA PCR SCREENING: MRSA by PCR: NEGATIVE

## 2020-11-05 LAB — LACTIC ACID, PLASMA
Lactic Acid, Venous: 2.8 mmol/L (ref 0.5–1.9)
Lactic Acid, Venous: 1.8 mmol/L (ref 0.5–1.9)

## 2020-11-05 LAB — PROTIME-INR
INR: 1.2 (ref 0.8–1.2)
Prothrombin Time: 15.1 seconds (ref 11.4–15.2)

## 2020-11-05 LAB — CK: Total CK: 260 U/L (ref 49–397)

## 2020-11-05 LAB — APTT: aPTT: 32 seconds (ref 24–36)

## 2020-11-05 LAB — MAGNESIUM: Magnesium: 2.1 mg/dL (ref 1.7–2.4)

## 2020-11-05 LAB — LITHIUM LEVEL: Lithium Lvl: 0.4 mmol/L — ABNORMAL LOW (ref 0.60–1.20)

## 2020-11-05 MED ORDER — POLYETHYLENE GLYCOL 3350 17 G PO PACK
17.0000 g | PACK | Freq: Every day | ORAL | Status: DC
  Administered 2020-11-05: 17 g
  Filled 2020-11-05 (×2): qty 1

## 2020-11-05 MED ORDER — FENTANYL BOLUS VIA INFUSION
25.0000 ug | INTRAVENOUS | Status: DC | PRN
  Administered 2020-11-05: 25 ug via INTRAVENOUS
  Administered 2020-11-05 – 2020-11-06 (×3): 50 ug via INTRAVENOUS
  Filled 2020-11-05: qty 50

## 2020-11-05 MED ORDER — CHLORHEXIDINE GLUCONATE 0.12% ORAL RINSE (MEDLINE KIT)
15.0000 mL | Freq: Two times a day (BID) | OROMUCOSAL | Status: DC
  Administered 2020-11-05 – 2020-11-09 (×8): 15 mL via OROMUCOSAL

## 2020-11-05 MED ORDER — FENTANYL CITRATE (PF) 100 MCG/2ML IJ SOLN
50.0000 ug | INTRAMUSCULAR | Status: DC | PRN
  Administered 2020-11-05 (×2): 50 ug via INTRAVENOUS
  Filled 2020-11-05 (×2): qty 2

## 2020-11-05 MED ORDER — VANCOMYCIN HCL IN DEXTROSE 1-5 GM/200ML-% IV SOLN
1000.0000 mg | INTRAVENOUS | Status: DC
  Filled 2020-11-05: qty 200

## 2020-11-05 MED ORDER — DOCUSATE SODIUM 50 MG/5ML PO LIQD
100.0000 mg | Freq: Two times a day (BID) | ORAL | Status: DC | PRN
  Filled 2020-11-05: qty 10

## 2020-11-05 MED ORDER — ROCURONIUM BROMIDE 50 MG/5ML IV SOLN
INTRAVENOUS | Status: AC | PRN
  Administered 2020-11-05: 100 mg via INTRAVENOUS

## 2020-11-05 MED ORDER — POLYETHYLENE GLYCOL 3350 17 G PO PACK: 17.0000 g | PACK | Freq: Every day | ORAL | Status: DC | PRN

## 2020-11-05 MED ORDER — NOREPINEPHRINE 4 MG/250ML-% IV SOLN
2.0000 ug/min | INTRAVENOUS | Status: DC
  Administered 2020-11-05: 2 ug/min via INTRAVENOUS
  Filled 2020-11-05: qty 250

## 2020-11-05 MED ORDER — DOCUSATE SODIUM 50 MG/5ML PO LIQD
100.0000 mg | Freq: Two times a day (BID) | ORAL | Status: DC
  Administered 2020-11-05: 100 mg
  Filled 2020-11-05 (×4): qty 10

## 2020-11-05 MED ORDER — SODIUM CHLORIDE 0.9 % IV SOLN
250.0000 mL | INTRAVENOUS | Status: DC
  Administered 2020-11-05 – 2020-11-08 (×2): 250 mL via INTRAVENOUS

## 2020-11-05 MED ORDER — LACTATED RINGERS IV BOLUS
500.0000 mL | Freq: Once | INTRAVENOUS | Status: AC
  Administered 2020-11-05: 500 mL via INTRAVENOUS

## 2020-11-05 MED ORDER — SODIUM CHLORIDE 0.9 % IV SOLN: 2.0000 g | Freq: Once | INTRAVENOUS | Status: DC

## 2020-11-05 MED ORDER — CHLORHEXIDINE GLUCONATE CLOTH 2 % EX PADS
6.0000 | MEDICATED_PAD | Freq: Every day | CUTANEOUS | Status: DC
  Administered 2020-11-06 – 2020-11-07 (×2): 6 via TOPICAL

## 2020-11-05 MED ORDER — IOHEXOL 350 MG/ML SOLN
75.0000 mL | Freq: Once | INTRAVENOUS | Status: AC | PRN
  Administered 2020-11-05: 75 mL via INTRAVENOUS

## 2020-11-05 MED ORDER — ORAL CARE MOUTH RINSE
15.0000 mL | OROMUCOSAL | Status: DC
  Administered 2020-11-05 – 2020-11-06 (×8): 15 mL via OROMUCOSAL

## 2020-11-05 MED ORDER — SODIUM CHLORIDE 0.9 % IV SOLN
2.0000 g | Freq: Two times a day (BID) | INTRAVENOUS | Status: DC
  Administered 2020-11-05 – 2020-11-06 (×2): 2 g via INTRAVENOUS
  Filled 2020-11-05 (×3): qty 2

## 2020-11-05 MED ORDER — METHYLPREDNISOLONE SODIUM SUCC 40 MG IJ SOLR
20.0000 mg | Freq: Two times a day (BID) | INTRAMUSCULAR | Status: DC
  Administered 2020-11-05 – 2020-11-07 (×4): 20 mg via INTRAVENOUS
  Filled 2020-11-05 (×4): qty 1

## 2020-11-05 MED ORDER — PROPOFOL 1000 MG/100ML IV EMUL
0.0000 ug/kg/min | INTRAVENOUS | Status: DC
  Administered 2020-11-05 (×2): 25 ug/kg/min via INTRAVENOUS
  Administered 2020-11-06: 20 ug/kg/min via INTRAVENOUS
  Filled 2020-11-05 (×2): qty 100

## 2020-11-05 MED ORDER — HEPARIN (PORCINE) 25000 UT/250ML-% IV SOLN
850.0000 [IU]/h | INTRAVENOUS | Status: DC
  Administered 2020-11-05: 850 [IU]/h via INTRAVENOUS
  Filled 2020-11-05: qty 250

## 2020-11-05 MED ORDER — SODIUM CHLORIDE 0.9 % IV SOLN
500.0000 mg | INTRAVENOUS | Status: DC
  Administered 2020-11-05 – 2020-11-06 (×2): 500 mg via INTRAVENOUS
  Filled 2020-11-05 (×3): qty 500

## 2020-11-05 MED ORDER — SODIUM CHLORIDE 0.9 % IV SOLN
2.0000 g | Freq: Once | INTRAVENOUS | Status: AC
  Administered 2020-11-05: 2 g via INTRAVENOUS
  Filled 2020-11-05: qty 2

## 2020-11-05 MED ORDER — HEPARIN BOLUS VIA INFUSION
4000.0000 [IU] | Freq: Once | INTRAVENOUS | Status: AC
  Administered 2020-11-05: 4000 [IU] via INTRAVENOUS
  Filled 2020-11-05: qty 4000

## 2020-11-05 MED ORDER — INSULIN ASPART 100 UNIT/ML IJ SOLN
0.0000 [IU] | INTRAMUSCULAR | Status: DC
  Administered 2020-11-05: 11 [IU] via SUBCUTANEOUS
  Administered 2020-11-05 (×2): 2 [IU] via SUBCUTANEOUS
  Administered 2020-11-06: 3 [IU] via SUBCUTANEOUS
  Administered 2020-11-06: 2 [IU] via SUBCUTANEOUS
  Administered 2020-11-06: 3 [IU] via SUBCUTANEOUS
  Administered 2020-11-07: 8 [IU] via SUBCUTANEOUS
  Administered 2020-11-07: 3 [IU] via SUBCUTANEOUS
  Administered 2020-11-07: 5 [IU] via SUBCUTANEOUS
  Administered 2020-11-07 – 2020-11-08 (×2): 2 [IU] via SUBCUTANEOUS
  Administered 2020-11-08 (×2): 3 [IU] via SUBCUTANEOUS
  Administered 2020-11-09: 8 [IU] via SUBCUTANEOUS
  Filled 2020-11-05 (×15): qty 1

## 2020-11-05 MED ORDER — ASPIRIN 81 MG PO CHEW: 324.0000 mg | CHEWABLE_TABLET | Freq: Once | ORAL | Status: DC

## 2020-11-05 MED ORDER — FENTANYL 2500MCG IN NS 250ML (10MCG/ML) PREMIX INFUSION
25.0000 ug/h | INTRAVENOUS | Status: DC
  Administered 2020-11-05: 25 ug/h via INTRAVENOUS
  Filled 2020-11-05: qty 250

## 2020-11-05 MED ORDER — DOCUSATE SODIUM 100 MG PO CAPS: 100.0000 mg | ORAL_CAPSULE | Freq: Two times a day (BID) | ORAL | Status: DC | PRN

## 2020-11-05 MED ORDER — LACTATED RINGERS IV BOLUS (SEPSIS)
1000.0000 mL | Freq: Once | INTRAVENOUS | Status: AC
  Administered 2020-11-05: 1000 mL via INTRAVENOUS

## 2020-11-05 MED ORDER — PROPOFOL 1000 MG/100ML IV EMUL
INTRAVENOUS | Status: AC
  Administered 2020-11-05: 20 ug/kg/min via INTRAVENOUS
  Filled 2020-11-05: qty 100

## 2020-11-05 MED ORDER — VANCOMYCIN HCL 1000 MG/200ML IV SOLN
1000.0000 mg | INTRAVENOUS | Status: DC
  Filled 2020-11-05: qty 200

## 2020-11-05 MED ORDER — IPRATROPIUM-ALBUTEROL 0.5-2.5 (3) MG/3ML IN SOLN
3.0000 mL | Freq: Four times a day (QID) | RESPIRATORY_TRACT | Status: DC | PRN
  Filled 2020-11-05 (×2): qty 3

## 2020-11-05 MED ORDER — ASPIRIN 81 MG PO CHEW
324.0000 mg | CHEWABLE_TABLET | Freq: Once | ORAL | Status: AC
  Administered 2020-11-05: 324 mg
  Filled 2020-11-05: qty 4

## 2020-11-05 MED ORDER — LACTATED RINGERS IV SOLN: INTRAVENOUS | Status: AC

## 2020-11-05 MED ORDER — ETOMIDATE 2 MG/ML IV SOLN
INTRAVENOUS | Status: AC | PRN
  Administered 2020-11-05: 30 mg via INTRAVENOUS

## 2020-11-05 MED ORDER — BUDESONIDE 0.25 MG/2ML IN SUSP
0.2500 mg | Freq: Two times a day (BID) | RESPIRATORY_TRACT | Status: DC
  Administered 2020-11-05 – 2020-11-09 (×7): 0.25 mg via RESPIRATORY_TRACT
  Filled 2020-11-05 (×8): qty 2

## 2020-11-05 MED ORDER — VANCOMYCIN HCL IN DEXTROSE 1-5 GM/200ML-% IV SOLN: 1000.0000 mg | Freq: Once | INTRAVENOUS | Status: DC

## 2020-11-05 MED ORDER — FENTANYL CITRATE (PF) 100 MCG/2ML IJ SOLN: 50.0000 ug | INTRAMUSCULAR | Status: DC | PRN

## 2020-11-05 MED ORDER — VANCOMYCIN HCL 1500 MG/300ML IV SOLN
1500.0000 mg | Freq: Once | INTRAVENOUS | Status: AC
  Administered 2020-11-05: 1500 mg via INTRAVENOUS
  Filled 2020-11-05: qty 300

## 2020-11-05 MED ORDER — HEPARIN SODIUM (PORCINE) 5000 UNIT/ML IJ SOLN
5000.0000 [IU] | Freq: Three times a day (TID) | INTRAMUSCULAR | Status: DC
  Administered 2020-11-05 – 2020-11-09 (×12): 5000 [IU] via SUBCUTANEOUS
  Filled 2020-11-05 (×11): qty 1

## 2020-11-05 MED ORDER — PANTOPRAZOLE SODIUM 40 MG IV SOLR
40.0000 mg | INTRAVENOUS | Status: DC
  Administered 2020-11-05 – 2020-11-06 (×2): 40 mg via INTRAVENOUS
  Filled 2020-11-05 (×3): qty 40

## 2020-11-05 NOTE — ED Notes (Addendum)
Bladder scan >527mls. MD made aware

## 2020-11-05 NOTE — Consult Note (Signed)
PHARMACY -  BRIEF ANTIBIOTIC NOTE   Pharmacy has received consult(s) for Cefepime/Vancomycin from an ED provider.  The patient's profile has been reviewed for ht/wt/allergies/indication/available labs.    One time order(s) placed for Vancomycin 1500mg  IV x 1, Cefepime 2g IV x 1  Further antibiotics/pharmacy consults should be ordered by admitting physician if indicated.                       Thank you,  Lu Duffel, PharmD, BCPS Clinical Pharmacist 11/05/2020 8:36 AM

## 2020-11-05 NOTE — Consult Note (Signed)
H&P Physician requesting consult: Hulan Saas  Chief Complaint: Urinary retention  History of Present Illness: 80 year old male with a history of prostate cancer status post prostatectomy and external radiation in the remote past.  He had post prostatectomy urinary incontinence and had his initial artificial sphincter placed in 2006.  He subsequently had removal and replacement of the sphincter on 07/10/2020 by Ponciano Ort at [facility].  The device was activated on 10/25/2020.  It was subsequently deactivated by Dr. Bernardo Heater due to urinary retention.  His renal function at the time had a creatinine of 5.45 that improved to 1.3.  He was treated with Rocephin while inpatient.  Patient was found down on the floor by his wife.  Patient presented altered but was able to present his name and give a thumbs up.  He had extreme dyspnea and fatigue.  Decision was made to intubate and sedate.  Bladder scan showed greater than 500 cc.  I was asked to assess.  Past Medical History:  Diagnosis Date  . Anemia   . Bipolar disorder ([place])   . Chronic diarrhea   . Colon polyps   . Depression   . Dermatophytosis   . Diabetic neuropathy ([place])   . DJD (degenerative joint disease)   . Dyspnea   . Gout   . Gross hematuria   . HLD (hyperlipidemia)   . HTN (hypertension)   . Hyperkeratosis   . Onychomycosis   . Parkinson disease ([place])   . Prostate cancer ([place])   . Sleep apnea   . Tremor   . Type 2 diabetes mellitus ([place])    Past Surgical History:  Procedure Laterality Date  . cataract surgery Bilateral   . COLONOSCOPY W/ BIOPSIES    . FOOT SURGERY    . PROSTATE SURGERY     removed  . status post implantation of artificial urinary sphincter     . TONSILLECTOMY      Home Medications:  (Not in a hospital admission)  Allergies:  Allergies  Allergen Reactions  . Carbidopa W-Levodopa Other (See Comments)    irrational behavior , felt zombie like  . Carbidopa-Levodopa Other (See  Comments)    irrational behavior , felt zombie like  . Latex Other (See Comments)    Internal such as catheters. Latex gloves are okay  . Solifenacin Other (See Comments)    Family History  Problem Relation Age of Onset  . Heart disease Mother   . Heart attack Father 48  . Hypertension Father   . Hyperlipidemia Father   . Kidney cancer Neg Hx   . Prostate cancer Neg Hx   . Bladder Cancer Neg Hx    Social History:  reports that he quit smoking about 21 years ago. He has never used smokeless tobacco. He reports that he does not drink alcohol and does not use drugs.  ROS: A complete review of systems was performed.  All systems are negative except for pertinent findings as noted. ROS   Physical Exam:  Vital signs in last 24 hours: Temp:  [97.1 F (36.2 C)] 97.1 F (36.2 C) (06/05 0757) Pulse Rate:  [107-131] 131 (06/05 1000) Resp:  [19-40] 21 (06/05 1000) BP: (128-193)/(62-87) 192/86 (06/05 1000) SpO2:  [91 %-97 %] 91 % (06/05 1000) FiO2 (%):  [60 %-80 %] 80 % (06/05 1000) Weight:  [70 kg] 70 kg (06/05 0820) General: Intubated and sedated HEENT: Normocephalic, atraumatic Neck: No JVD or lymphadenopathy Cardiovascular: Tachycardic Lungs: Appropriate rate on the ventilator, chest  expands symmetrically Abdomen: Soft, nontender, nondistended, no abdominal masses Extremities: No edema Neurologic: GCS 3 T Genitourinary: Patient has a circumcised phallus.  Bilateral testicles descended without masses.  Suprapubic area without any erythema.  No crepitus.  Scrotum normal.  The sphincter pump felt deactivated.  Went ahead and deactivated again to ensure complete deactivation.  The bulb remained decompressed.  I was able to express urine by pressing on his bladder in order to send for culture.  Procedure: Under sterile conditions, I gently placed a 14 French Foley catheter into the urethra and into the bladder.  There was no resistance.  There was return of clear yellow  urine.  Laboratory Data:  Results for orders placed or performed during the hospital encounter of 11/05/20 (from the past 24 hour(s))  Blood gas, venous     Status: Abnormal (Preliminary result)   Collection Time: 11/05/20  7:50 AM  Result Value Ref Range   pH, Ven 7.19 (LL) 7.250 - 7.430   pCO2, Ven 70 (H) 44.0 - 60.0 mmHg   pO2, Ven PENDING 32.0 - 45.0 mmHg   Bicarbonate 26.7 20.0 - 28.0 mmol/L   Acid-base deficit 2.6 (H) 0.0 - 2.0 mmol/L   O2 Saturation 32.4 %   Patient temperature 37.0    Collection site VEIN    Sample type VEIN   CBC with Differential/Platelet     Status: Abnormal   Collection Time: 11/05/20  7:53 AM  Result Value Ref Range   WBC 30.9 (H) 4.0 - 10.5 K/uL   RBC 3.37 (L) 4.22 - 5.81 MIL/uL   Hemoglobin 11.1 (L) 13.0 - 17.0 g/dL   HCT 34.9 (L) 39.0 - 52.0 %   MCV 103.6 (H) 80.0 - 100.0 fL   MCH 32.9 26.0 - 34.0 pg   MCHC 31.8 30.0 - 36.0 g/dL   RDW 11.9 11.5 - 15.5 %   Platelets 278 150 - 400 K/uL   nRBC 0.0 0.0 - 0.2 %   Neutrophils Relative % 89 %   Neutro Abs 28.1 (H) 1.7 - 7.7 K/uL   Lymphocytes Relative 3 %   Lymphs Abs 0.9 0.7 - 4.0 K/uL   Monocytes Relative 4 %   Monocytes Absolute 1.1 (H) 0.1 - 1.0 K/uL   Eosinophils Relative 1 %   Eosinophils Absolute 0.2 0.0 - 0.5 K/uL   Basophils Relative 1 %   Basophils Absolute 0.1 0.0 - 0.1 K/uL   WBC Morphology MILD LEFT SHIFT (1-5% METAS, OCC MYELO, OCC BANDS)    Smear Review Normal platelet morphology    Immature Granulocytes 2 %   Abs Immature Granulocytes 0.46 (H) 0.00 - 0.07 K/uL   Polychromasia PRESENT   Comprehensive metabolic panel     Status: Abnormal   Collection Time: 11/05/20  7:54 AM  Result Value Ref Range   Sodium 137 135 - 145 mmol/L   Potassium 4.2 3.5 - 5.1 mmol/L   Chloride 103 98 - 111 mmol/L   CO2 23 22 - 32 mmol/L   Glucose, Bld 334 (H) 70 - 99 mg/dL   BUN 27 (H) 8 - 23 mg/dL   Creatinine, Ser 1.32 (H) 0.61 - 1.24 mg/dL   Calcium 9.9 8.9 - 10.3 mg/dL   Total Protein 6.6 6.5  - 8.1 g/dL   Albumin 3.3 (L) 3.5 - 5.0 g/dL   AST 16 15 - 41 U/L   ALT 12 0 - 44 U/L   Alkaline Phosphatase 81 38 - 126 U/L   Total Bilirubin 0.6  0.3 - 1.2 mg/dL   GFR, Estimated 54 (L) >60 mL/min   Anion gap 11 5 - 15  Troponin I (High Sensitivity)     Status: Abnormal   Collection Time: 11/05/20  7:54 AM  Result Value Ref Range   Troponin I (High Sensitivity) 92 (H) <18 ng/L  Magnesium     Status: None   Collection Time: 11/05/20  7:54 AM  Result Value Ref Range   Magnesium 2.1 1.7 - 2.4 mg/dL  CK     Status: None   Collection Time: 11/05/20  7:54 AM  Result Value Ref Range   Total CK 260 49 - 397 U/L  Resp Panel by RT-PCR (Flu A&B, Covid) Nasopharyngeal Swab     Status: None   Collection Time: 11/05/20  7:54 AM   Specimen: Nasopharyngeal Swab; Nasopharyngeal(NP) swabs in vial transport medium  Result Value Ref Range   SARS Coronavirus 2 by RT PCR NEGATIVE NEGATIVE   Influenza A by PCR NEGATIVE NEGATIVE   Influenza B by PCR NEGATIVE NEGATIVE  Protime-INR     Status: None   Collection Time: 11/05/20  7:54 AM  Result Value Ref Range   Prothrombin Time 15.1 11.4 - 15.2 seconds   INR 1.2 0.8 - 1.2  APTT     Status: None   Collection Time: 11/05/20  7:54 AM  Result Value Ref Range   aPTT 32 24 - 36 seconds  Blood gas, arterial     Status: Abnormal   Collection Time: 11/05/20  9:07 AM  Result Value Ref Range   FIO2 60.00    Delivery systems VENTILATOR    Mode ASSIST CONTROL    VT 450 mL   LHR 20 resp/min   Peep/cpap 5.0 cm H20   pH, Arterial 7.26 (L) 7.350 - 7.450   pCO2 arterial 54 (H) 32.0 - 48.0 mmHg   pO2, Arterial 83 83.0 - 108.0 mmHg   Bicarbonate 24.2 20.0 - 28.0 mmol/L   Acid-base deficit 3.2 (H) 0.0 - 2.0 mmol/L   O2 Saturation 94.3 %   Patient temperature 37.0    Collection site LEFT RADIAL    Sample type ARTHROGRAPHIS SPECIES    Allens test (pass/fail) PASS PASS   Mechanical Rate 20   Lactic acid, plasma     Status: Abnormal   Collection Time: 11/05/20   9:49 AM  Result Value Ref Range   Lactic Acid, Venous 2.8 (HH) 0.5 - 1.9 mmol/L   Recent Results (from the past 240 hour(s))  Urine culture     Status: Abnormal   Collection Time: 10/31/20  3:02 PM   Specimen: Urine, Random  Result Value Ref Range Status   Specimen Description   Final    URINE, RANDOM Performed at [facility], [address] [place], [place] [address]    Special Requests   Final    NONE Performed at [facility], [address] [place], [place] [address]    Culture MULTIPLE SPECIES PRESENT, SUGGEST RECOLLECTION (A)  Final   Report Status 11/02/2020 FINAL  Final  SARS CORONAVIRUS 2 (TAT 6-24 HRS) Nasopharyngeal Nasopharyngeal Swab     Status: None   Collection Time: 10/31/20  3:24 PM   Specimen: Nasopharyngeal Swab  Result Value Ref Range Status   SARS Coronavirus 2 NEGATIVE NEGATIVE Final    Comment: (NOTE) SARS-CoV-2 target nucleic acids are NOT DETECTED.  The SARS-CoV-2 RNA is generally detectable in upper and lower respiratory specimens during the acute phase of infection.  Negative results do not preclude SARS-CoV-2 infection, do not rule out co-infections with other pathogens, and should not be used as the sole basis for treatment or other patient management decisions. Negative results must be combined with clinical observations, patient history, and epidemiological information. The expected result is Negative.  Fact Sheet for Patients: SugarRoll.be  Fact Sheet for Healthcare Providers: https://www.woods-mathews.com/  This test is not yet approved or cleared by the [place] FDA and  has been authorized for detection and/or diagnosis of SARS-CoV-2 by FDA under an Emergency Use Authorization (EUA). This EUA will remain  in effect (meaning this test can be used) for the duration of the COVID-19 declaration under Se ction 564(b)(1) of the Act, 21 U.S.C. section 360bbb-3(b)(1), unless  the authorization is terminated or revoked sooner.  Performed at [facility], [place] [address] [place], [place] [address]   Resp Panel by RT-PCR (Flu A&B, Covid) Nasopharyngeal Swab     Status: None   Collection Time: 11/05/20  7:54 AM   Specimen: Nasopharyngeal Swab; Nasopharyngeal(NP) swabs in vial transport medium  Result Value Ref Range Status   SARS Coronavirus 2 by RT PCR NEGATIVE NEGATIVE Final    Comment: (NOTE) SARS-CoV-2 target nucleic acids are NOT DETECTED.  The SARS-CoV-2 RNA is generally detectable in upper respiratory specimens during the acute phase of infection. The lowest concentration of SARS-CoV-2 viral copies this assay can detect is 138 copies/mL. A negative result does not preclude SARS-Cov-2 infection and should not be used as the sole basis for treatment or other patient management decisions. A negative result may occur with  improper specimen collection/handling, submission of specimen other than nasopharyngeal swab, presence of viral mutation(s) within the areas targeted by this assay, and inadequate number of viral copies(<138 copies/mL). A negative result must be combined with clinical observations, patient history, and epidemiological information. The expected result is Negative.  Fact Sheet for Patients:  EntrepreneurPulse.com.au  Fact Sheet for Healthcare Providers:  IncredibleEmployment.be  This test is no t yet approved or cleared by the [place] FDA and  has been authorized for detection and/or diagnosis of SARS-CoV-2 by FDA under an Emergency Use Authorization (EUA). This EUA will remain  in effect (meaning this test can be used) for the duration of the COVID-19 declaration under Section 564(b)(1) of the Act, 21 U.S.C.section 360bbb-3(b)(1), unless the authorization is terminated  or revoked sooner.       Influenza A by PCR NEGATIVE NEGATIVE Final   Influenza B by PCR NEGATIVE NEGATIVE Final     Comment: (NOTE) The Xpert Xpress SARS-CoV-2/FLU/RSV plus assay is intended as an aid in the diagnosis of influenza from Nasopharyngeal swab specimens and should not be used as a sole basis for treatment. Nasal washings and aspirates are unacceptable for Xpert Xpress SARS-CoV-2/FLU/RSV testing.  Fact Sheet for Patients: EntrepreneurPulse.com.au  Fact Sheet for Healthcare Providers: IncredibleEmployment.be  This test is not yet approved or cleared by the [place] FDA and has been authorized for detection and/or diagnosis of SARS-CoV-2 by FDA under an Emergency Use Authorization (EUA). This EUA will remain in effect (meaning this test can be used) for the duration of the COVID-19 declaration under Section 564(b)(1) of the Act, 21 U.S.C. section 360bbb-3(b)(1), unless the authorization is terminated or revoked.  Performed at [facility], [address] [place], [place] [address]    Creatinine: Recent Labs    10/31/20 1412 10/31/20 1850 10/31/20 2241 11/01/20 0201 11/01/20 0525 11/02/20 0405 11/05/20 0754  CREATININE 5.45*  4.69* 3.74* 3.27* 2.76* 1.30* 1.32*    Impression/Assessment:  Urinary retention Sepsis of unknown origin  Plan:  Sphincter is deactivated and he has a 14 [place] catheter in.  This should not remain in place for more than 3 or 4 days.  If he needs prolonged catheterization recommend transition to a suprapubic tube by interventional radiology under imaging guidance.  Sepsis work-up per primary team/emergency department.  Marton Redwood, III 11/05/2020, 10:20 AM

## 2020-11-05 NOTE — ED Notes (Signed)
CBG 320 - machine would not crossover

## 2020-11-05 NOTE — H&P (Addendum)
NAME:  JOEY LIERMAN, MRN:  [id number], DOB:  1940/12/16, LOS: 0 ADMISSION DATE:  11/05/2020, CONSULTATION DATE: 11/05/2020 REFERRING MD: Hulan Saas, MD CHIEF COMPLAINT: Unresponsiveness   History of present illness   80 year old male with past medical history of prostate cancer s/p radical prostatectomy in the early 2000's and subsequent radiation therapy resulting in bladder neck contracture and urinary incontinence with artificial urinary sphincter placement in 2006, hypertension, hyperlipidemia, diabetes mellitus type 2 who presents to [facility] ED after being found down by wife unresponsive.  Patient is currently intubated and mechanically ventilated on sedation, he is unable to provide history so history was mostly obtained from patient's wife who is currently at the bedside and ED notes.  Per patient's wife, patient was recently discharged on 11/02/2020 following patient for AKI secondary to obstructive uropathy, and asymptomatic pyuria.  Since discharge patient's wife states that he has not been feeling well, has been complaining of generalized weakness, shortness of breath and fever.  He was seen by his primary care physician on 11/03/2020 for suspected acute lower respiratory infection and was sent home on azithromycin and cough syrups.  His wife states this morning he woke up not feeling well and was attempting to go to the bathroom when she noticed that he was unsteady on his feet prior to falling on the floor. Patient's wife initially thought he had taken too much Tussionex with Codeine therefore she called EMS for lift assist back in the bed. EMS was called a second time due to worsening mental status post fall.  ED Course: On arrival to the ED, he was afebrile with blood pressure (!) 183/77 mm Hg and pulse rate (!) 140 beats/min.RR (!) 24, sats initially!) 70% on RA. There were no focal neurological deficits; he was altered  but was able to state his name and give thumbs up .Venous Blood Gas  result:  pO2 pending; pCO2 70; pH 7.19;  HCO3 26.2, %O2 Sat 32.4.  Due to decreased mentation and concerns for hypoxic and hypercapnic respiratory failure patient was intubated in the ED for airway protection.  Labs revealed WBC 30.9, hemoglobin 11.1, hematocrit 34.9, MCV 103.6 otherwise unremarkable CBC, glucose 334, BUN 27, creatinine 1.32 troponin 92>138,  lactic acid 2.8.  Chest x-ray showed right upper lobe opacity concerning for pneumonia without overt edema, effusion, pneumothorax, rib fracture or any other clear acute intrathoracic process.  CT head, CT TLC-spine showed no clear acute intracranial or acute C-spine injury.  CT chest abdomen pelvis showed no evidence of PE but was remarkable for bilateral pneumonia concerning for possible aspiration pneumonia as well as some debris's in the airway. EKG sinus tachycardia with a ventricular rate of 130, right bundle branch block, diffuse nonspecific ST changes.  Due to concern for possible NSTEMI patient was started on heparin drip.  Given his history of artificial urethral sphincter urology was consulted who deactivated sphincter and placed a 14 French catheter. PCCM did for further management.  Past Medical History   Prostate cancer s/p radical prostatectomy and sphincter placement  Bipolar disorder (HCC)  Chronic diarrhea  Colon polyps  Depression  Dermatophytosis  Diabetic neuropathy (HCC)  DJD (degenerative joint disease)  Dyspnea  Gout  Gross hematuria  HLD (hyperlipidemia)  HTN (hypertension)  Hyperkeratosis  Onychomycosis  Parkinson disease ([place])  Prostate cancer ([place])  Sleep apnea  Tremor  Type 2 diabetes mellitus ([place])    Significant Hospital Events   11/05/2020>Admit to ICU with acute hypoxic hypercapneic respiratory failure  Consults:  Urology  Procedures:  11/05/2020: Intubation  Significant Diagnostic Tests:  11/05/2020: Chest Xray>showed right upper lobe opacity concerning for pneumonia without overt edema, effusion,  pneumothorax, rib fracture or any other clear acute intrathoracic process 11/05/2020: Noncontrast CT head> no acute intracranial abnormality 11/05/2020: CTA  Chest, abdomen and pelvis>showed no evidence of PE but was remarkable for bilateral pneumonia concerning for possible aspiration pneumonia as well as some debris's in the airway 11/05/2020: CT L,T and cervical spine >spine showed no clear acute intracranial or acute C-spine injury  Micro Data:  6/5: SARS-CoV-2 PCR>> negative 6/5: Influenza PCR>> negative 6/5: Blood culture x2>> 6/5: Urine Culture>> 6/5: MRSA PCR>>  6/5: Strep pneumo urinary antigen>  Antimicrobials:  Vancomycin: 6/5>> Cefepime: 6/5> Azithromycin:6/5>  OBJECTIVE  Blood pressure 128/77, pulse (!) 121, temperature (!) 97.1 F (36.2 C), temperature source Axillary, resp. rate 20, height 5\' 3"  (1.6 m), weight 70 kg, SpO2 93 %.       No intake or output data in the 24 hours ending 11/05/20 0903 Filed Weights   11/05/20 0820  Weight: 70 kg    Physical Examination  GENERAL:year-old critically ill patient lying in the bed, mechanically ventilated and sedated EYES: Pupils equal, round, reactive to light and accommodation. No scleral icterus. Extraocular muscles intact.  HEENT: Head atraumatic, normocephalic. Oropharynx and nasopharynx clear.  NECK:  Supple, no jugular venous distention. No thyroid enlargement, no tenderness.  LUNGS: Normal breath sounds bilaterally, no wheezing, rales,rhonchi or crepitation. No use of accessory muscles of respiration.  CARDIOVASCULAR: S1, S2 normal. No murmurs, rubs, or gallops.  ABDOMEN: Soft, nontender, nondistended. Bowel sounds present. No organomegaly or mass.  EXTREMITIES: No pedal edema, cyanosis, or clubbing.  NEUROLOGIC: Cranial nerves II through XII are intact.  Muscle strength not assessed. Sensation intact. Gait not checked.  PSYCHIATRIC: Unable to assess SKIN: No obvious rash, lesion, or ulcer.   Labs/imaging that I  havepersonally reviewed  (right click and "Reselect all SmartList Selections" daily)     Labs   CBC: Recent Labs  Lab 10/31/20 1412 11/01/20 0201 11/02/20 0405 11/05/20 0753  WBC 20.1* 15.0* 6.8 30.9*  NEUTROABS  --  12.7*  --  PENDING  HGB 10.8* 10.5* 9.7* 11.1*  HCT 32.7* 31.1* 30.4* 34.9*  MCV 98.5 99.4 101.0* 103.6*  PLT 177 184 153 638    Basic Metabolic Panel: Recent Labs  Lab 10/31/20 2241 11/01/20 0201 11/01/20 0525 11/02/20 0405 11/05/20 0754  NA 133* 135 134* 139 137  K 4.0 4.0 4.0 4.3 4.2  CL 97* 102 102 108 103  CO2 27 26 25 25 23   GLUCOSE 166* 81 128* 147* 334*  BUN 73* 73* 68* 45* 27*  CREATININE 3.74* 3.27* 2.76* 1.30* 1.32*  CALCIUM 9.6 9.7 9.4 9.4 9.9  MG  --  3.7*  --   --  2.1   GFR: Estimated Creatinine Clearance: 37.9 mL/min (A) (by C-G formula based on SCr of 1.32 mg/dL (H)). Recent Labs  Lab 10/31/20 1412 11/01/20 0201 11/02/20 0405 11/05/20 0753 11/05/20 0949  WBC 20.1* 15.0* 6.8 30.9*  --   LATICACIDVEN  --   --   --   --  2.8*    Liver Function Tests: Recent Labs  Lab 11/01/20 0201 11/05/20 0754  AST 11* 16  ALT 15 12  ALKPHOS 63 81  BILITOT 0.7 0.6  PROT 5.8* 6.6  ALBUMIN 2.7* 3.3*   No results for input(s): LIPASE, AMYLASE in the last 168 hours. No results for input(s): AMMONIA in  the last 168 hours.  ABG    Component Value Date/Time   HCO3 26.7 11/05/2020 0750   ACIDBASEDEF 2.6 (H) 11/05/2020 0750   O2SAT 32.4 11/05/2020 0750     Coagulation Profile: No results for input(s): INR, PROTIME in the last 168 hours.  Cardiac Enzymes: Recent Labs  Lab 11/05/20 0754  CKTOTAL 260    HbA1C: Hgb A1c MFr Bld  Date/Time Value Ref Range Status  10/31/2020 02:12 PM 5.6 4.8 - 5.6 % Final    Comment:    (NOTE)         Prediabetes: 5.7 - 6.4         Diabetes: >6.4         Glycemic control for adults with diabetes: <7.0   07/02/2016 05:48 AM 5.7 (H) 4.8 - 5.6 % Final    Comment:    (NOTE)         Pre-diabetes:  5.7 - 6.4         Diabetes: >6.4         Glycemic control for adults with diabetes: <7.0     CBG: Recent Labs  Lab 11/01/20 0751 11/01/20 1147 11/01/20 1626 11/01/20 2124 11/02/20 0729  GLUCAP 120* 202* 141* 142* 149*    Review of Systems:   UNABLE TO ASSESS DUE TO ALTERED MENTAL STATUS, INTUBATED AND SEDATED.  Past Medical History  He,  has a past medical history of Anemia, Bipolar disorder ([place]), Chronic diarrhea, Colon polyps, Depression, Dermatophytosis, Diabetic neuropathy ([place]), DJD (degenerative joint disease), Dyspnea, Gout, Gross hematuria, HLD (hyperlipidemia), HTN (hypertension), Hyperkeratosis, Onychomycosis, Parkinson disease ([place]), Prostate cancer ([place]), Sleep apnea, Tremor, and Type 2 diabetes mellitus ([place]).   Surgical History    Past Surgical History:  Procedure Laterality Date  . cataract surgery Bilateral   . COLONOSCOPY W/ BIOPSIES    . FOOT SURGERY    . PROSTATE SURGERY     removed  . status post implantation of artificial urinary sphincter     . TONSILLECTOMY       Social History   reports that he quit smoking about 21 years ago. He has never used smokeless tobacco. He reports that he does not drink alcohol and does not use drugs.   Family History   His family history includes Heart attack (age of onset: 6) in his father; Heart disease in his mother; Hyperlipidemia in his father; Hypertension in his father. There is no history of Kidney cancer, Prostate cancer, or Bladder Cancer.   Allergies Allergies  Allergen Reactions  . Carbidopa W-Levodopa Other (See Comments)    irrational behavior , felt zombie like  . Carbidopa-Levodopa Other (See Comments)    irrational behavior , felt zombie like  . Latex Other (See Comments)    Internal such as catheters. Latex gloves are okay  . Solifenacin Other (See Comments)     Home Medications  Prior to Admission medications   Medication Sig Start Date End Date Taking? Authorizing Provider  atorvastatin  (LIPITOR) 40 MG tablet Take 40 mg by mouth every evening.    [provider]  carbidopa-levodopa (SINEMET IR) 25-100 MG tablet Take 1 tablet by mouth in the morning and at bedtime.    [provider]  lithium carbonate 300 MG capsule Take 300 mg by mouth 2 (two) times daily.    [provider]  rOPINIRole (REQUIP) 2 MG tablet Take 2 mg by mouth at bedtime.    [provider]  traZODone (DESYREL) 50 MG tablet Take  50-100 mg by mouth at bedtime.    [provider]   Scheduled Meds: . aspirin  324 mg Per Tube Once  . docusate  100 mg Per Tube BID  . heparin  4,000 Units Intravenous Once  . insulin aspart  0-15 Units Subcutaneous Q4H  . pantoprazole (PROTONIX) IV  40 mg Intravenous Q24H  . polyethylene glycol  17 g Per Tube Daily   Continuous Infusions: . azithromycin    . ceFEPime (MAXIPIME) IV    . fentaNYL infusion INTRAVENOUS    . heparin    . lactated ringers 150 mL/hr at 11/05/20 1144  . propofol (DIPRIVAN) infusion 25 mcg/kg/min (11/05/20 1214)  . [START ON 11/06/2020] vancomycin     PRN Meds:.docusate, fentaNYL, fentaNYL (SUBLIMAZE) injection, fentaNYL (SUBLIMAZE) injection, ipratropium-albuterol, polyethylene glycol   Active Hospital Problem list   Acute hypoxic hypercapnic respiratory failure secondary to multifocal/aspiration pneumonia Sepsis AKI NSTEMI Acute metabolic encephalopathy Obstructive uropathy Hypertension Diabetes mellitus/   Assessment & Plan:   Acute Hypoxic / Hypercapnic Respiratory Failure secondary to multifocal Pneumonia superimposed on Aspiration pneumonia CT Chest: Multifocal bilateral ground-glass opacities and consolidationwith debris in the airways is suggestive of aspiration pneumonia -Wean PEEP & FiO2 as tolerated, maintain SpO2 > 90%* -Head of bed elevated 30 degrees, VAP protocol in place -Plateau pressures less than 30 cm H20  -Intermittent chest x-ray & ABG PRN -Daily WUA with SBT as tolerated   -Ensure adequate pulmonary hygiene  -Steroids initiated: solu-medrol 20 mg BID  -Budesonide inhaler nebs BID, bronchodilators PRN -PAD protocol in place: continue Fentanyl drip & Propofol drip  Sepsis without septic shock due to Pneumonia Lactic:2.8, Baseline PCT: 0.41, UA: pending, CT: Multifocal bilateral ground-glass opacities and consolidationwith debris in the airways is suggestive of aspiration pneumonia Initial interventions/workup included:1.5 L of NS/LR & Cefepime/ Vancomycin -Daily CBC -monitor WBC/ fever curve -F/u cultures, trend PCT, lactic -Continue CAP/Aspiration Pna coverage cefepime/ Vancomycin and Azithromycin -IVF hydration as needed:  -Consider vasopressors to maintain MAP> 65,  -Strict I/O's  NSTEMI (dynamic EKG changes with abnormal cardiac enzymes) vs Demand Ischemia in the setting of sepsis as above HTN, HLD -Trend troponins -Serial EKGs -Continue home Atorvastatin 40mg  PO daily, check lipid panel  -Consider Beta-blocker -NTG + morphine PRN chest pain or NTG drip if ongoing chest pain  -Anti-coagulation (Heparin drip per ACS protocol) -Cardiology Consult / Interventional Assessment  Acute Metabolic Encephalopathy due to Hypercapnia -Provide supportive care  Urinary Retention Obstructive uropathy from bladder outlet obstruction PMHx: Prostate Cancer s/p s/p radical prostatectomy with sphincter placement -s/pSphincter is deactivation with 14 French catheter in place by Urology -Per Urology, needs remain in place for more than 3 or 4 days.  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 Hx of obstructive uropathy a above -Monitor I&O's / urinary output -Follow BMP -Ensure adequate renal perfusion -Avoid nephrotoxic agents as able -Replace electrolytes as indicated   Diabetes mellitus -CBGs Q4 while on  steroids -Sliding scale insulin -Follow ICU hyper/hypoglycemia protocol -Hold home Meds      Best practice:  Diet:  Oral Pain/Anxiety/Delirium protocol (if indicated): Yes (RASS goal -1) VAP protocol (if indicated): Yes DVT prophylaxis: Systemic AC GI prophylaxis: PPI Glucose control:  SSI Yes Central venous access:  N/A Arterial line:  N/A Foley:  Yes, and it is still needed Mobility:  bed rest  PT consulted: N/A Last date of multidisciplinary goals  of care discussion [6/5] Code Status:  full code Disposition: ICU  Critical care time:45     Rufina Falco, DNP, CCRN, FNP-C, AGACNP-BC Acute Care Nurse Practitioner  [facility] Pager: [phone] [place] at [facility]  .

## 2020-11-05 NOTE — Consult Note (Signed)
PHARMACY CONSULT NOTE - FOLLOW UP  Pharmacy Consult for Electrolyte Monitoring and Replacement   Recent Labs: Potassium (mmol/L)  Date Value  11/05/2020 4.2  05/10/2014 4.8   Magnesium (mg/dL)  Date Value  11/05/2020 2.1   Calcium (mg/dL)  Date Value  11/05/2020 9.9   Calcium, Total (mg/dL)  Date Value  05/10/2014 10.0   Albumin (g/dL)  Date Value  11/05/2020 3.3 (L)   Sodium (mmol/L)  Date Value  11/05/2020 137  05/10/2014 140    Assessment: 80 yo M presenting with AMS. PMH includes HTN, HLD, diabetes, and prostate cancer s/p prostatectomy. 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 The device was subsequently deactivated d/t urinary retention. Pt was admitted 5/31-6/2 after receiving treatment for an AKI and UTI. Pt is intubated (6/5 >>) and on heparin gtt for elevated troponins. Pharmacy has been consulted to monitor and replace electrolytes.   Goal of Therapy:  Electrolytes WNL  Plan:  - All electrolytes WNL - Continue monitoring with AM labs   Benn Moulder, PharmD Pharmacy Resident  11/05/2020 12:14 PM

## 2020-11-05 NOTE — Progress Notes (Signed)
Patient transported to CT via transport  vent with RN.

## 2020-11-05 NOTE — ED Provider Notes (Signed)
[facility] Emergency Department Provider Note  ____________________________________________   Event Date/Time   First MD Initiated Contact with Patient 11/05/20 [id number]     (approximate)  I have reviewed the triage vital signs and the nursing notes.   HISTORY  Chief Complaint Altered Mental Status (Sepsis alert)   HPI Craig Wilson is a 80 y.o. 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  Per EMS they were called out earlier today after patient reportedly had a fall but patient was not brought to the emergency room for evaluation but was helped back to bed.  On reassess arrival today on the second visit patient seemed a little bit altered but was able to state his name and give a thumbs up.  He is unable provide any history and per EMS wife it seems also very poor historian.  Patient endorses cough to this examiner but denies any acute pain.  However he is extremely dyspneic and sleepy and unable to write any additional history.         Past Medical History:  Diagnosis Date  . Anemia   . Bipolar disorder ([place])   . Chronic diarrhea   . Colon polyps   . Depression   . Dermatophytosis   . Diabetic neuropathy ([place])   . DJD (degenerative joint disease)   . Dyspnea   . Gout   . Gross hematuria   . HLD (hyperlipidemia)   . HTN (hypertension)   . Hyperkeratosis   . Onychomycosis   . Parkinson disease ([place])   . Prostate cancer ([place])   . Sleep apnea   . Tremor   . Type 2 diabetes mellitus [facility])     Patient Active Problem List   Diagnosis Date Noted  . Acute kidney injury ([place]) 10/31/2020  . Bladder outlet obstruction 10/31/2020  . Hyponatremia 10/31/2020  . Acute  cystitis without hematuria 10/31/2020  . 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 OSA (obstructive sleep apnea) 11/03/2019  . RBBB (right bundle branch block) 11/03/2019  . Drug-induced parkinsonism ([place]) 07/21/2019  . Other emphysema ([place]) 06/27/2017  . Acquired trigger finger 04/10/2017  . Elevated coronary artery calcium score 03/04/2017  . Sepsis ([place]) 07/01/2016  . CAP (community acquired pneumonia) 07/01/2016  . Influenza A 07/01/2016  . DDD (degenerative disc disease), lumbar 03/11/2016  . Senile purpura ([place]) 03/11/2016  . 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 Mass of left side of neck 10/19/2015  . Thrombocytopenia ([place]) 09/07/2015  . Bilateral edema of lower extremity 05/10/2015  . Bipolar disorder, in partial remission, most recent episode depressed ([place]) 05/10/2015  . 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 Edema of lower extremity 05/10/2015  . RLS (restless legs syndrome) 03/29/2015  . Adenomatous polyp 02/24/2015  . Anemia 02/24/2015  . Affective bipolar disorder ([place]) 02/24/2015  . Chronic diarrhea 02/24/2015  . Diabetic neuropathy ([place]) 02/24/2015  . Arthritis, degenerative 02/24/2015  . 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 BP (high blood pressure) 02/24/2015  . CA of prostate ([place]) 02/24/2015  . Incontinence 02/01/2015  . History of prostate cancer 02/01/2015  . Gross hematuria 02/01/2015  . SOB (shortness of breath) 12/16/2014  . Pain in the chest 12/16/2014  . Family history of premature CAD 12/16/2014  . Parkinson's  disease ([place]) 12/16/2014  . Essential hypertension 12/16/2014  . Unsteady gait 12/16/2014  . Hyperlipidemia 12/16/2014  . 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 Difficulty in walking 10/10/2014  . Difficulty sleeping 10/10/2014  . Breath shortness 05/06/2014  . Amnesia 02/28/2014  . Type 2 diabetes mellitus without complication, without long-term current use of insulin ([place]) 02/15/2014  . Has a tremor 02/01/2006  .  Tremor 02/01/2006    Past Surgical History:  Procedure Laterality Date  . cataract surgery Bilateral   . COLONOSCOPY W/ BIOPSIES    . FOOT SURGERY    . PROSTATE SURGERY     removed  . status post implantation of artificial urinary sphincter     . TONSILLECTOMY      Prior to Admission medications   Medication Sig Start Date End Date Taking? Authorizing Provider  atorvastatin (LIPITOR) 40 MG tablet Take 40 mg by mouth every evening.    [provider]  carbidopa-levodopa (SINEMET IR) 25-100 MG tablet Take 1 tablet by mouth in the morning and at bedtime.    [provider]  lithium carbonate 300 MG capsule Take 300 mg by mouth 2 (two) times daily.    [provider]  rOPINIRole (REQUIP) 2 MG tablet Take 2 mg by mouth at bedtime.    [provider]  traZODone (DESYREL) 50 MG tablet Take 50-100 mg by mouth at bedtime.    [provider]    Allergies Carbidopa w-levodopa, Carbidopa-levodopa, Latex, and Solifenacin  Family History  Problem Relation Age of Onset  . Heart disease Mother   . Heart attack Father 75  . Hypertension Father   . Hyperlipidemia Father   . Kidney cancer Neg Hx   . Prostate cancer Neg Hx   . Bladder Cancer Neg Hx     Social History Social History   Tobacco Use  . Smoking status: Former Smoker    Quit date: 10/24/1999    Years since quitting: 21.0  . Smokeless tobacco: Never Used  Substance Use Topics  . Alcohol use: No    Alcohol/week: 0.0 standard drinks  . Drug use: No    Review of Systems  Review of Systems  Unable to perform ROS: Mental status change      ____________________________________________   PHYSICAL EXAM:  VITAL SIGNS: ED Triage Vitals  Enc Vitals Group     BP      Pulse      Resp      Temp      Temp src      SpO2      Weight      Height      Head Circumference      Peak Flow      Pain Score      Pain Loc      Pain Edu?      Excl. in [place]?    Vitals:   11/05/20  1030 11/05/20 1045  BP: (!) 145/65 135/64  Pulse: (!) 102 99  Resp: (!) 21 (!) 22  Temp:    SpO2: 95% 96%   Physical Exam Vitals and nursing note reviewed.  Constitutional:      General: He is in acute distress.     Appearance: He is well-developed. He is ill-appearing.  HENT:     Head: Normocephalic and atraumatic.     Right Ear: External ear normal.     Left Ear: External ear normal.     Nose: Nose normal.  Mouth/Throat:     Mouth: Mucous membranes are dry.  Eyes:     Conjunctiva/sclera: Conjunctivae normal.  Cardiovascular:     Rate and Rhythm: Regular rhythm. Tachycardia present.     Heart sounds: No murmur heard.   Pulmonary:     Effort: Tachypnea and respiratory distress present.     Breath sounds: Decreased breath sounds and rhonchi present.  Abdominal:     General: There is no distension.     Palpations: Abdomen is soft.     Tenderness: There is no abdominal tenderness.  Musculoskeletal:     Cervical back: Neck supple.     Right lower leg: No edema.     Left lower leg: No edema.  Skin:    General: Skin is warm and dry.     Capillary Refill: Capillary refill takes 2 to 3 seconds.  Neurological:     General: No focal deficit present.     Mental Status: He is lethargic and confused.     No step-offs or obvious tenderness over the C/T/L-spine.  2+ radial pulses.  2+ DP pulse.  There is a very superficial abrasion over the right knee with no other obvious trauma to the extremities.  Pupils are equally round and symmetric to light.  Patient is able to state his name to give this examiner thumbs up in both hands but does not otherwise participate in exam. ____________________________________________   LABS (all labs ordered are listed, but only abnormal results are displayed)  Labs Reviewed  LACTIC ACID, PLASMA - Abnormal; Notable for the following components:      Result Value   Lactic Acid, Venous 2.8 (*)    All other components within normal limits   COMPREHENSIVE METABOLIC PANEL - Abnormal; Notable for the following components:   Glucose, Bld 334 (*)    BUN 27 (*)    Creatinine, Ser 1.32 (*)    Albumin 3.3 (*)    GFR, Estimated 54 (*)    All other components within normal limits  BLOOD GAS, VENOUS - Abnormal; Notable for the following components:   pH, Ven 7.19 (*)    pCO2, Ven 70 (*)    Acid-base deficit 2.6 (*)    All other components within normal limits  CBC WITH DIFFERENTIAL/PLATELET - Abnormal; Notable for the following components:   WBC 30.9 (*)    RBC 3.37 (*)    Hemoglobin 11.1 (*)    HCT 34.9 (*)    MCV 103.6 (*)    Neutro Abs 28.1 (*)    Monocytes Absolute 1.1 (*)    Abs Immature Granulocytes 0.46 (*)    All other components within normal limits  BLOOD GAS, ARTERIAL - Abnormal; Notable for the following components:   pH, Arterial 7.26 (*)    pCO2 arterial 54 (*)    Acid-base deficit 3.2 (*)    All other components within normal limits  TROPONIN I (HIGH SENSITIVITY) - Abnormal; Notable for the following components:   Troponin I (High Sensitivity) 92 (*)    All other components within normal limits  RESP PANEL BY RT-PCR (FLU A&B, COVID) ARPGX2  CULTURE, BLOOD (SINGLE)  URINE CULTURE  MAGNESIUM  PROCALCITONIN  CK  PROTIME-INR  APTT  LACTIC ACID, PLASMA  CBC WITH DIFFERENTIAL/PLATELET  URINALYSIS, COMPLETE (UACMP) WITH MICROSCOPIC  LITHIUM LEVEL  TROPONIN I (HIGH SENSITIVITY)   ____________________________________________  EKG  Sinus tachycardia with a ventricular rate of 130, right bundle branch block, diffuse nonspecific ST changes. ____________________________________________  [id number]  ED MD interpretation: Chest x-ray remarkable for right upper lobe opacity concerning for pneumonia without overt edema, effusion, pneumothorax, rib fracture or any other clear acute intrathoracic process.  CT head and CT C-spine showed no clear acute intracranial or acute C-spine injury.  CT chest abdomen pelvis  showed no evidence of PE but evidence of bilateral pneumonia concerning for possible aspiration pneumonia as well with some debris's in the airway.  There is also small right pleural effusion.  No significant trauma in the chest abdomen or pelvis or other acute abdominal pelvic process.  T and L unremarkable.   Official radiology report(s): CT Head Wo Contrast  Result Date: 11/05/2020 CLINICAL DATA:  Fall, altered mental status, head trauma, neck trauma; history hypertension, Parkinson's disease, type II diabetes mellitus, prostate cancer EXAM: CT HEAD WITHOUT CONTRAST CT CERVICAL SPINE WITHOUT CONTRAST TECHNIQUE: Multidetector CT imaging of the head and cervical spine was performed following the standard protocol without intravenous contrast. Multiplanar CT image reconstructions of the cervical spine were also generated. COMPARISON:  CT head 01/14/2018 FINDINGS: CT HEAD FINDINGS Brain: Diffuse age-related atrophy. Normal ventricular morphology. No midline shift or mass effect. Otherwise normal appearance of brain parenchyma. No intracranial hemorrhage, mass lesion, or evidence of acute infarction. No extra-axial fluid collections. Vascular: No hyperdense vessels. Mild atherosclerotic calcification of internal carotid arteries at skull base Skull: Demineralized but intact Sinuses/Orbits: Mucosal thickening throughout ethmoid air cells, frontal sinus, nasal passages, maxillary sinuses. Other: N/A CT CERVICAL SPINE FINDINGS Alignment: Minimal retrolisthesis at C4-C5. Remaining alignments normal. Skull base and vertebrae: Slight osseous demineralization. Vertebral body heights maintained. Disc space narrowing C4-C5 with scattered endplate spurs at T5-T7, C5-C6, and C6-C7. Skull base intact. No fracture, additional subluxation, or bone destruction. Soft tissues and spinal canal: Prevertebral soft tissues normal thickness. Endotracheal and nasogastric tubes noted. 10 mm RIGHT thyroid nodule seen Not clinically  significant; no follow-up imaging recommended (ref: J Am Coll Radiol. 2015 Feb;12(2): 143-50). Disc levels:  No specific abnormalities Upper chest: Tips of lung apices clear Other: N/A IMPRESSION: Generalized age-related atrophy. No acute intracranial abnormalities. Degenerative disc disease changes of the cervical spine. No acute cervical spine abnormalities. Electronically Signed   By: Lavonia Dana M.D.   On: 11/05/2020 10:36   CT Angio Chest PE W and/or Wo Contrast  Result Date: 11/05/2020 CLINICAL DATA:  Fall from standing, intubated. History of prostate cancer status post prostatectomy in 2006. EXAM: CT ANGIOGRAPHY CHEST CT ABDOMEN AND PELVIS WITH CONTRAST TECHNIQUE: Multidetector CT imaging of the chest was performed using the standard protocol during bolus administration of intravenous contrast. Multiplanar CT image reconstructions and MIPs were obtained to evaluate the vascular anatomy. Multidetector CT imaging of the abdomen and pelvis was performed using the standard protocol during bolus administration of intravenous contrast. CONTRAST:  64mL OMNIPAQUE IOHEXOL 350 MG/ML SOLN COMPARISON:  CT chest, abdomen, and pelvis dated 07/03/2020. FINDINGS: CTA CHEST FINDINGS Cardiovascular: Satisfactory opacification of the pulmonary arteries to the segmental level. No evidence of pulmonary embolism. Vascular calcifications are seen in the coronary arteries and aortic arch. Normal heart size. No pericardial effusion. Mediastinum/Nodes: An enlarged right hilar lymph node measures 11 mm in short axis (series 5, image 47) and is likely reactive. There is no mediastinal or axillary lymphadenopathy. An endotracheal tube terminates in the midthoracic trachea. An enteric tube terminates at the gastroesophageal junction. Aspirated debris is seen in the trachea, both mainstem bronchi, and multiple bronchi supplying the bilateral lower lobes. Thyroid gland is unremarkable. Lungs/Pleura: Patchy bilateral ground-glass  opacities and areas of consolidation have a lower lobe predominance. There is a small right pleural effusion. There is no left pleural effusion. There is no pneumothorax. Musculoskeletal: Degenerative changes are seen in the spine. No acute or significant osseous findings. Review of the MIP images confirms the above findings. CT ABDOMEN and PELVIS FINDINGS Hepatobiliary: A 1.5 cm cyst is seen in the inferior aspect of the right hepatic lobe. A gallstone is seen in the gallbladder. No gallbladder wall thickening or biliary dilatation. Pancreas: Unremarkable. No pancreatic ductal dilatation or surrounding inflammatory changes. Spleen: Normal in size without focal abnormality. Adrenals/Urinary Tract: Adrenal glands are unremarkable. Bilateral cysts measure up to 7.8 cm on the right and 1.3 cm on the left. Otherwise, the kidneys are normal, without renal calculi, solid mass, or hydronephrosis. Bladder is unremarkable. Stomach/Bowel: Stomach is within normal limits. Appendix appears normal. There is colonic diverticulosis without evidence of diverticulitis. No evidence of bowel wall thickening, distention, or inflammatory changes. Vascular/Lymphatic: Aortic atherosclerosis. No enlarged abdominal or pelvic lymph nodes. Reproductive: The patient is status post prostatectomy. An artificial urinary sphincter is redemonstrated. Other: No abdominal wall hernia or abnormality. No abdominopelvic ascites. Musculoskeletal: Degenerative changes are seen in the spine. Review of the MIP images confirms the above findings. IMPRESSION: 1. Multifocal bilateral ground-glass opacities and consolidation with debris in the airways is suggestive of aspiration pneumonia. Small right pleural effusion. 2. No evidence of pulmonary embolism. 3. No acute traumatic injury in the chest, abdomen, or pelvis. Electronically Signed   By: Zerita Boers M.D.   On: 11/05/2020 10:53   CT Cervical Spine Wo Contrast  Result Date: 11/05/2020 CLINICAL DATA:   Fall, altered mental status, head trauma, neck trauma; history hypertension, Parkinson's disease, type II diabetes mellitus, prostate cancer EXAM: CT HEAD WITHOUT CONTRAST CT CERVICAL SPINE WITHOUT CONTRAST TECHNIQUE: Multidetector CT imaging of the head and cervical spine was performed following the standard protocol without intravenous contrast. Multiplanar CT image reconstructions of the cervical spine were also generated. COMPARISON:  CT head 01/14/2018 FINDINGS: CT HEAD FINDINGS Brain: Diffuse age-related atrophy. Normal ventricular morphology. No midline shift or mass effect. Otherwise normal appearance of brain parenchyma. No intracranial hemorrhage, mass lesion, or evidence of acute infarction. No extra-axial fluid collections. Vascular: No hyperdense vessels. Mild atherosclerotic calcification of internal carotid arteries at skull base Skull: Demineralized but intact Sinuses/Orbits: Mucosal thickening throughout ethmoid air cells, frontal sinus, nasal passages, maxillary sinuses. Other: N/A CT CERVICAL SPINE FINDINGS Alignment: Minimal retrolisthesis at C4-C5. Remaining alignments normal. Skull base and vertebrae: Slight osseous demineralization. Vertebral body heights maintained. Disc space narrowing C4-C5 with scattered endplate spurs at P2-R5, C5-C6, and C6-C7. Skull base intact. No fracture, additional subluxation, or bone destruction. Soft tissues and spinal canal: Prevertebral soft tissues normal thickness. Endotracheal and nasogastric tubes noted. 10 mm RIGHT thyroid nodule seen Not clinically significant; no follow-up imaging recommended (ref: J Am Coll Radiol. 2015 Feb;12(2): 143-50). Disc levels:  No specific abnormalities Upper chest: Tips of lung apices clear Other: N/A IMPRESSION: Generalized age-related atrophy. No acute intracranial abnormalities. Degenerative disc disease changes of the cervical spine. No acute cervical spine abnormalities. Electronically Signed   By: Lavonia Dana M.D.   On:  11/05/2020 10:36   CT ABDOMEN PELVIS W CONTRAST  Result Date: 11/05/2020 CLINICAL DATA:  Fall from standing, intubated. History of prostate cancer status post prostatectomy in 2006. EXAM: CT ANGIOGRAPHY CHEST CT ABDOMEN AND PELVIS WITH CONTRAST TECHNIQUE: Multidetector CT imaging of the chest was performed using the standard protocol  during bolus administration of intravenous contrast. Multiplanar CT image reconstructions and MIPs were obtained to evaluate the vascular anatomy. Multidetector CT imaging of the abdomen and pelvis was performed using the standard protocol during bolus administration of intravenous contrast. CONTRAST:  53mL OMNIPAQUE IOHEXOL 350 MG/ML SOLN COMPARISON:  CT chest, abdomen, and pelvis dated 07/03/2020. FINDINGS: CTA CHEST FINDINGS Cardiovascular: Satisfactory opacification of the pulmonary arteries to the segmental level. No evidence of pulmonary embolism. Vascular calcifications are seen in the coronary arteries and aortic arch. Normal heart size. No pericardial effusion. Mediastinum/Nodes: An enlarged right hilar lymph node measures 11 mm in short axis (series 5, image 47) and is likely reactive. There is no mediastinal or axillary lymphadenopathy. An endotracheal tube terminates in the midthoracic trachea. An enteric tube terminates at the gastroesophageal junction. Aspirated debris is seen in the trachea, both mainstem bronchi, and multiple bronchi supplying the bilateral lower lobes. Thyroid gland is unremarkable. Lungs/Pleura: Patchy bilateral ground-glass opacities and areas of consolidation have a lower lobe predominance. There is a small right pleural effusion. There is no left pleural effusion. There is no pneumothorax. Musculoskeletal: Degenerative changes are seen in the spine. No acute or significant osseous findings. Review of the MIP images confirms the above findings. CT ABDOMEN and PELVIS FINDINGS Hepatobiliary: A 1.5 cm cyst is seen in the inferior aspect of the right  hepatic lobe. A gallstone is seen in the gallbladder. No gallbladder wall thickening or biliary dilatation. Pancreas: Unremarkable. No pancreatic ductal dilatation or surrounding inflammatory changes. Spleen: Normal in size without focal abnormality. Adrenals/Urinary Tract: Adrenal glands are unremarkable. Bilateral cysts measure up to 7.8 cm on the right and 1.3 cm on the left. Otherwise, the kidneys are normal, without renal calculi, solid mass, or hydronephrosis. Bladder is unremarkable. Stomach/Bowel: Stomach is within normal limits. Appendix appears normal. There is colonic diverticulosis without evidence of diverticulitis. No evidence of bowel wall thickening, distention, or inflammatory changes. Vascular/Lymphatic: Aortic atherosclerosis. No enlarged abdominal or pelvic lymph nodes. Reproductive: The patient is status post prostatectomy. An artificial urinary sphincter is redemonstrated. Other: No abdominal wall hernia or abnormality. No abdominopelvic ascites. Musculoskeletal: Degenerative changes are seen in the spine. Review of the MIP images confirms the above findings. IMPRESSION: 1. Multifocal bilateral ground-glass opacities and consolidation with debris in the airways is suggestive of aspiration pneumonia. Small right pleural effusion. 2. No evidence of pulmonary embolism. 3. No acute traumatic injury in the chest, abdomen, or pelvis. Electronically Signed   By: Zerita Boers M.D.   On: 11/05/2020 10:53   CT T-SPINE NO CHARGE  Result Date: 11/05/2020 CLINICAL DATA:  Fall from standing, found down EXAM: CT THORACIC SPINE WITHOUT CONTRAST TECHNIQUE: Multidetector CT images of the thoracic were obtained using the standard protocol without intravenous contrast. COMPARISON:  CT chest dated 07/03/2020. FINDINGS: Alignment: Normal. Vertebrae: No acute fracture or focal pathologic process. Paraspinal and other soft tissues: Patchy bilateral ground-glass opacities and consolidation with a lower lobe  predominance is partially imaged. There is a small right pleural effusion. Please see same day chest CT for further details. Disc levels: Multilevel mild-to-moderate degenerative disc and joint disease. IMPRESSION: No acute osseous injury. Electronically Signed   By: Zerita Boers M.D.   On: 11/05/2020 10:53   CT L-SPINE NO CHARGE  Result Date: 11/05/2020 CLINICAL DATA:  Fall from standing, found down. EXAM: CT LUMBAR SPINE WITHOUT CONTRAST TECHNIQUE: Multidetector CT imaging of the lumbar spine was performed without intravenous contrast administration. Multiplanar CT image reconstructions were also generated. COMPARISON:  CT abdomen pelvis dated 07/03/2020. FINDINGS: Segmentation: 5 lumbar type vertebrae. Alignment: Normal. Vertebrae: No acute fracture or focal pathologic process. Paraspinal and other soft tissues: No acute finding. Disc levels: Multilevel degenerative disc and joint disease up to moderate severity at L5-S1 where there is mild right and moderate left neuroforaminal stenosis. IMPRESSION: No acute osseous injury. Electronically Signed   By: Zerita Boers M.D.   On: 11/05/2020 10:51   DG Chest Port 1 View  Result Date: 11/05/2020 CLINICAL DATA:  80 year old male intubated. EXAM: PORTABLE CHEST 1 VIEW COMPARISON:  Portable chest 0755 hours today and earlier. FINDINGS: Portable AP supine view at 0848 hours. Endotracheal tube tip in good position just below the clavicles. Enteric tube courses in the esophagus but cannot be visualized below the mid cardiac contour. Larger lung volumes. Mediastinal contours are stable. Somewhat coarse bilateral pulmonary opacity is stable. No pneumothorax or new pulmonary opacity identified. Pacer or resuscitation pads project over the chest now. No acute osseous abnormality identified. IMPRESSION: 1. Endotracheal tube tip in good position. Enteric tube courses into the esophagus but cannot be visualized below the mid cardiac contour. Recommend follow-up portable  upper abdominal radiograph. 2. Larger lung volumes with stable coarse bilateral pulmonary opacity. Electronically Signed   By: Genevie Ann M.D.   On: 11/05/2020 09:01   DG Chest Port 1 View  Result Date: 11/05/2020 CLINICAL DATA:  Shortness of breath EXAM: PORTABLE CHEST 1 VIEW COMPARISON:  March 06, 2018 chest radiograph; chest CT July 03, 2020 FINDINGS: There is a subtle area of ill-defined opacity in the right upper lobe. Lungs elsewhere are clear. Heart size and pulmonary vascularity are normal. No adenopathy. There is degenerative change in the thoracic spine. IMPRESSION: Rather subtle area of opacity right upper lobe, a likely focus of pneumonia. Lungs elsewhere clear. Heart size normal. Followup PA and lateral chest radiographs recommended in 3-4 weeks following trial of antibiotic therapy to ensure resolution and exclude underlying malignancy. Electronically Signed   By: Lowella Grip III M.D.   On: 11/05/2020 08:09   DG Knee Complete 4 Views Right  Result Date: 11/05/2020 CLINICAL DATA:  Pain following fall EXAM: RIGHT KNEE - COMPLETE 4+ VIEW COMPARISON:  None. FINDINGS: Frontal, lateral, and bilateral oblique views were obtained. There is no fracture or dislocation. No appreciable joint effusion. There is mild narrowing of the patellofemoral joint. Other joint spaces appear normal. No erosive change. IMPRESSION: Slight narrowing patellofemoral joint. Other joint spaces appear unremarkable. No evident fracture, dislocation, or joint effusion. Electronically Signed   By: Lowella Grip III M.D.   On: 11/05/2020 08:44    ____________________________________________   PROCEDURES  Procedure(s) performed (including Critical Care):  Procedure Name: Intubation Date/Time: 11/05/2020 9:12 AM Performed by: Lucrezia Starch, MD Pre-anesthesia Checklist: Patient identified, Timeout performed, Suction available, Emergency Drugs available and Patient being monitored Oxygen Delivery Method:  Non-rebreather mask Preoxygenation: Pre-oxygenation with 100% oxygen Induction Type: Rapid sequence Laryngoscope Size: Glidescope Tube size: 7.5 mm Number of attempts: 1 Airway Equipment and Method: Video-laryngoscopy Secured at: 25 cm Tube secured with: ETT holder    .Critical Care Performed by: Lucrezia Starch, MD Authorized by: Lucrezia Starch, MD   Critical care provider statement:    Critical care time (minutes):  45   Critical care time was exclusive of:  Separately billable procedures and treating other patients   Critical care was necessary to treat or prevent imminent or life-threatening deterioration of the following conditions:  Respiratory failure, CNS failure or  compromise and cardiac failure   Critical care was time spent personally by me on the following activities:  Discussions with consultants, evaluation of patient's response to treatment, examination of patient, ordering and performing treatments and interventions, ordering and review of laboratory studies, ordering and review of radiographic studies, pulse oximetry, re-evaluation of patient's condition, obtaining history from patient or surrogate and review of old charts     ____________________________________________   INITIAL IMPRESSION / [place] / ED COURSE      Patient presents with above-stated history and exam for assessment of altered mental status and possible hypoxia after being found on the floor today by wife.  Seems he had fallen earlier this evening and was evaluated and assisted back to bed as well.  On arrival he is hypertensive with BP of 183/77, tachycardic to 140, tachypneic at 24 and SPO2 of 94% on 6 L is noted have an SPO2 of 87% on room air.  He is able to follow some very basic commands but appears extremely tired and does not otherwise participate in exam.  He is a small abrasion on his right knee but no other obvious trauma on exam.  However given his poor historian with  tachycardia and tachypnea.   Will obtain full trauma scans.  Given rhonchi with tachypnea and tachycardia will also treat as possible sepsis with IV fluids and broad-spectrum antibiotics as well as obtaining blood culture and lactic acid.  Chest x-ray remarkable for right upper lobe opacity concerning for pneumonia without overt edema, effusion, pneumothorax, rib fracture or any other clear acute intrathoracic process.  VBG remarkable for pH of 7.19 with a PCO2 of 70 and a bicarb of 26.2.  In addition he seemed to have slightly decreased mentation, assessment, and concern for hypoxic and hypercarbic respiratory failure with concern for patient unable to protect his airway he was intubated.  Please see per procedure note above.   CBC remarkable for WBC count of 30.9, hemoglobin 11.1 and normal platelets.  CMP remarkable for glucose of 334 without evidence of acidosis and kidney function at baseline.  No other significant electrolyte or metabolic derangements.  Magnesium is WNL.  Initial troponin is elevated at 92 although certainly could reflect some demand ischemia in the setting of sepsis respiratory failure we will plan to give ASA and start heparin given multiple nonspecific findings on ECG pending CT head patient does not have any evidence of acute hemorrhage.   CT head and CT C-spine showed no clear acute intracranial or acute C-spine injury.  CT chest abdomen pelvis showed no evidence of PE but evidence of bilateral pneumonia concerning for possible aspiration pneumonia as well with some debris's in the airway.  There is also small right pleural effusion.  No significant trauma in the chest abdomen or pelvis or other acute abdominal pelvic process.  T and L unremarkable.   Given no evidence of bleeding ASA and heparin ordered.  In addition given history of artificial urethral sphincter urology (Dr Gloriann Loan) consulted.  Dr. Gloriann Loan was able to deactivated and placed a Foley without difficulty.  I will  admit the patient to medical ICU for further evaluation and management. ____________________________________________   FINAL CLINICAL IMPRESSION(S) / ED DIAGNOSES  Final diagnoses:  Fall  Acute respiratory failure with hypoxia and hypercapnia (HCC)  Altered mental status, unspecified altered mental status type  NSTEMI (non-ST elevated myocardial infarction) ([place])  Urinary retention  Sepsis, due to unspecified organism, unspecified whether acute organ dysfunction present ([place])  Medications  insulin aspart (novoLOG) injection 0-15 Units (11 Units Subcutaneous Given 11/05/20 1020)  fentaNYL (SUBLIMAZE) injection 50 mcg (50 mcg Intravenous Given 11/05/20 0908)  fentaNYL (SUBLIMAZE) injection 50 mcg (has no administration in time range)  lactated ringers infusion (has no administration in time range)  lactated ringers bolus 500 mL (has no administration in time range)  aspirin chewable tablet 324 mg (has no administration in time range)  lactated ringers bolus 1,000 mL (0 mLs Intravenous Stopped 11/05/20 1004)  ceFEPIme (MAXIPIME) 2 g in sodium chloride 0.9 % 100 mL IVPB (0 g Intravenous Stopped 11/05/20 0844)  etomidate (AMIDATE) injection (30 mg Intravenous Given 11/05/20 0835)  vancomycin (VANCOREADY) IVPB 1500 mg/300 mL (1,500 mg Intravenous New Bag/Given 11/05/20 0909)  rocuronium (ZEMURON) injection (100 mg Intravenous Given 11/05/20 0836)  iohexol (OMNIPAQUE) 350 MG/ML injection 75 mL (75 mLs Intravenous Contrast Given 11/05/20 1019)     ED Discharge Orders    None       Note:  This document was prepared using Dragon voice recognition software and may include unintentional dictation errors.   Lucrezia Starch, MD 11/05/20 [phone]

## 2020-11-05 NOTE — ED Notes (Signed)
Pt transported to CT with this RN, RT and CT tech

## 2020-11-05 NOTE — ED Notes (Signed)
Critical result called from lab - troponin 138. MD Tamala Julian made aware

## 2020-11-05 NOTE — ED Notes (Signed)
Pt wife at bedside and updated on POC. MD made aware

## 2020-11-05 NOTE — ED Notes (Signed)
This RN and pt back from CT.

## 2020-11-05 NOTE — Consult Note (Addendum)
Pharmacy Antibiotic Note  Craig Wilson is a 80 y.o. male admitted on 11/05/2020 with CAP. Pt presenting with AMS. PMH includes HTN, HLD, diabetes, and prostate cancer s/p prostatectomy. 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 The device was subsequently deactivated d/t urinary retention. Pt was admitted 5/31-6/2 after receiving treatment for an AKI and UTI. Pt is intubated (6/5 >>) and on heparin gtt for elevated troponins. Pharmacy has been consulted to dose and monitor vancomycin and cefepime.   Plan: - Continue azithromycin 500 mg IV q24h  - Cefepime 2 g IV x 1 dose given in ED. Ordered cefepime 2 g q12h to follow.  - Vancomycin 1500 mg x 1 loading dose given in ED. Ordered 1 g IV q24h to follow.  - Estimated AUC 508, Cmin 13.3   - Used Scr 1.32 to calculate  - Monitor levels with 4th dose - Monitor renal function   Height: 5\' 8"  (172.7 cm) Weight: 70 kg (154 lb 5.2 oz) IBW/kg (Calculated) : 68.4  Temp (24hrs), Avg:97.1 F (36.2 C), Min:97.1 F (36.2 C), Max:97.1 F (36.2 C)  Recent Labs  Lab 10/31/20 1412 10/31/20 1850 10/31/20 2241 11/01/20 0201 11/01/20 0525 11/02/20 0405 11/05/20 0753 11/05/20 0754 11/05/20 0949 11/05/20 1021  WBC 20.1*  --   --  15.0*  --  6.8 30.9*  --   --   --   CREATININE 5.45*   < > 3.74* 3.27* 2.76* 1.30*  --  1.32*  --   --   LATICACIDVEN  --   --   --   --   --   --   --   --  2.8* 1.8   < > = values in this interval not displayed.    Estimated Creatinine Clearance: 41.7 mL/min (A) (by C-G formula based on SCr of 1.32 mg/dL (H)).    Allergies  Allergen Reactions  . Carbidopa W-Levodopa Other (See Comments)    irrational behavior , felt zombie like  . Carbidopa-Levodopa Other (See Comments)    irrational behavior , felt zombie like  . Latex Other (See Comments)    Internal such as catheters. Latex gloves are okay  . Solifenacin Other (See Comments)    Antimicrobials  this admission: 6/5 vancomycin >> 6/5 cefepime >>  Microbiology results: 6/5 BCx: pending 6/5 UCx: pending  Thank you for allowing pharmacy to be a part of this patient's care.  Benn Moulder, PharmD Pharmacy Resident  11/05/2020 12:33 PM

## 2020-11-05 NOTE — ED Notes (Signed)
Critical result called from lab - pH 7.19 and Lactic 2.8. MD Tamala Julian made aware

## 2020-11-05 NOTE — ED Triage Notes (Signed)
Pt to ED via [facility] from home. Per EMS they were called out earlier this morning for lift assist and pt had fallen. They were called again for AMS and pt was found on bathroom floor. Pt sepsis alert. Tachy with HR 120s, RA 70%. Pt responsive to painful stimuli. Family poor historians. Pt breathing labored and shallow. EMS stating bottle of codeine at bedside. Pupils pinpoint.

## 2020-11-05 NOTE — Consult Note (Signed)
CODE SEPSIS - PHARMACY COMMUNICATION  **Broad Spectrum Antibiotics should be administered within 1 hour of Sepsis diagnosis**  Time Code Sepsis Called/Page Received: 1102  Antibiotics Ordered: cefepime and vancomycin   Time of 1st antibiotic administration: 0811  Additional action taken by pharmacy: N/A  If necessary, Name of Provider/Nurse Contacted: N/A  Benn Moulder, PharmD Pharmacy Resident  11/05/2020 11:22 AM

## 2020-11-05 NOTE — Progress Notes (Signed)
Elevated Troponin due to suspected demand ischemia in the setting of worsening bilateral pneumonia and Acute on Chronic Respiratory failure PMHx: HLD, HTN Troponin: 92>138>67, EKG demonstrating known RBBB with T wave inversion in V3 & inferior leads. Suspect demand ischemia, discussed case with Dr. Gillermina Phy at [facility] who agreed with discontinuation of heparin drip. - Stop heparin drip - initiate VTE prophylaxis - continuous cardiac monitoring   Venetia Night, AGACNP-BC Acute Care Nurse Practitioner [facility]   [phone] / [phone] Please see Amion for pager details.

## 2020-11-05 NOTE — Consult Note (Signed)
ANTICOAGULATION CONSULT NOTE - Initial Consult  Pharmacy Consult for heparin Indication: chest pain/ACS  Allergies  Allergen Reactions  . Carbidopa W-Levodopa Other (See Comments)    irrational behavior , felt zombie like  . Carbidopa-Levodopa Other (See Comments)    irrational behavior , felt zombie like  . Latex Other (See Comments)    Internal such as catheters. Latex gloves are okay  . Solifenacin Other (See Comments)    Patient Measurements: Height: 5\' 8"  (172.7 cm) Weight: 70 kg (154 lb 5.2 oz) IBW/kg (Calculated) : 68.4 Heparin Dosing Weight: 70  Vital Signs: Temp: 97.1 F (36.2 C) (06/05 0757) Temp Source: Axillary (06/05 0757) BP: 121/58 (06/05 1115) Pulse Rate: 93 (06/05 1115)  Labs: Recent Labs    11/05/20 0753 11/05/20 0754 11/05/20 1021  HGB 11.1*  --   --   HCT 34.9*  --   --   PLT 278  --   --   APTT  --  32  --   LABPROT  --  15.1  --   INR  --  1.2  --   CREATININE  --  1.32*  --   CKTOTAL  --  260  --   TROPONINIHS  --  92* 138*    Estimated Creatinine Clearance: 41.7 mL/min (A) (by C-G formula based on SCr of 1.32 mg/dL (H)).   Medical History: Past Medical History:  Diagnosis Date  . Anemia   . Bipolar disorder ([place])   . Chronic diarrhea   . Colon polyps   . Depression   . Dermatophytosis   . Diabetic neuropathy ([place])   . DJD (degenerative joint disease)   . Dyspnea   . Gout   . Gross hematuria   . HLD (hyperlipidemia)   . HTN (hypertension)   . Hyperkeratosis   . Onychomycosis   . Parkinson disease ([place])   . Prostate cancer ([place])   . Sleep apnea   . Tremor   . Type 2 diabetes mellitus (HCC)     Medications:  No anticoagulants prior to admission   Assessment: 80 yo M presenting with AMS. PMH includes HTN, HLD, diabetes, and prostate cancer s/p prostatectomy. 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 The device was subsequently deactivated d/t urinary  retention. Pt was admitted 5/31-6/2 after receiving treatment for an AKI and UTI. Pt is intubated. Pharmacy has been consulted to dose and monitor heparin.   Hgb 11.1, plt 278, trops 92>138  Goal of Therapy:  Heparin level 0.3-0.7 units/ml Monitor platelets by anticoagulation protocol: Yes   Plan:  Give 4000 units bolus x 1 Start heparin infusion at 850 units/hr Check anti-Xa level in 8 hours and daily while on heparin Continue to monitor H&H and platelets  Benn Moulder, PharmD Pharmacy Resident  11/05/2020 11:39 AM

## 2020-11-05 NOTE — ED Notes (Signed)
Preparing pt for intubation per MD orders. RT called. Charge RN made aware.

## 2020-11-05 NOTE — ED Notes (Signed)
Urology at bedside.

## 2020-11-06 DIAGNOSIS — R4182 Altered mental status, unspecified: Secondary | ICD-10-CM

## 2020-11-06 DIAGNOSIS — R338 Other retention of urine: Secondary | ICD-10-CM

## 2020-11-06 DIAGNOSIS — A419 Sepsis, unspecified organism: Secondary | ICD-10-CM

## 2020-11-06 DIAGNOSIS — J189 Pneumonia, unspecified organism: Secondary | ICD-10-CM

## 2020-11-06 LAB — URINE CULTURE: Culture: NO GROWTH

## 2020-11-06 LAB — GLUCOSE, CAPILLARY
Glucose-Capillary: 111 mg/dL — ABNORMAL HIGH (ref 70–99)
Glucose-Capillary: 123 mg/dL — ABNORMAL HIGH (ref 70–99)
Glucose-Capillary: 124 mg/dL — ABNORMAL HIGH (ref 70–99)
Glucose-Capillary: 134 mg/dL — ABNORMAL HIGH (ref 70–99)
Glucose-Capillary: 161 mg/dL — ABNORMAL HIGH (ref 70–99)
Glucose-Capillary: 199 mg/dL — ABNORMAL HIGH (ref 70–99)

## 2020-11-06 LAB — CBC WITH DIFFERENTIAL/PLATELET
Abs Immature Granulocytes: 0.19 10*3/uL — ABNORMAL HIGH (ref 0.00–0.07)
Basophils Absolute: 0 10*3/uL (ref 0.0–0.1)
Basophils Relative: 0 %
Eosinophils Absolute: 0 10*3/uL (ref 0.0–0.5)
Eosinophils Relative: 0 %
HCT: 29.4 % — ABNORMAL LOW (ref 39.0–52.0)
Hemoglobin: 9.6 g/dL — ABNORMAL LOW (ref 13.0–17.0)
Immature Granulocytes: 1 %
Lymphocytes Relative: 2 %
Lymphs Abs: 0.4 10*3/uL — ABNORMAL LOW (ref 0.7–4.0)
MCH: 33.3 pg (ref 26.0–34.0)
MCHC: 32.7 g/dL (ref 30.0–36.0)
MCV: 102.1 fL — ABNORMAL HIGH (ref 80.0–100.0)
Monocytes Absolute: 0.2 10*3/uL (ref 0.1–1.0)
Monocytes Relative: 1 %
Neutro Abs: 20 10*3/uL — ABNORMAL HIGH (ref 1.7–7.7)
Neutrophils Relative %: 96 %
Platelets: 184 10*3/uL (ref 150–400)
RBC: 2.88 MIL/uL — ABNORMAL LOW (ref 4.22–5.81)
RDW: 12.2 % (ref 11.5–15.5)
WBC: 20.9 10*3/uL — ABNORMAL HIGH (ref 4.0–10.5)
nRBC: 0 % (ref 0.0–0.2)

## 2020-11-06 LAB — BASIC METABOLIC PANEL
Anion gap: 8 (ref 5–15)
BUN: 24 mg/dL — ABNORMAL HIGH (ref 8–23)
CO2: 22 mmol/L (ref 22–32)
Calcium: 9.7 mg/dL (ref 8.9–10.3)
Chloride: 107 mmol/L (ref 98–111)
Creatinine, Ser: 0.97 mg/dL (ref 0.61–1.24)
GFR, Estimated: >60 mL/min (ref >60)
Glucose, Bld: 167 mg/dL — ABNORMAL HIGH (ref 70–99)
Potassium: 4.6 mmol/L (ref 3.5–5.1)
Sodium: 137 mmol/L (ref 135–145)

## 2020-11-06 LAB — BLOOD GAS, ARTERIAL
Acid-base deficit: 2.3 mmol/L — ABNORMAL HIGH (ref 0.0–2.0)
Bicarbonate: 22.5 mmol/L (ref 20.0–28.0)
FIO2: 55
MECHVT: 450 mL
Mechanical Rate: 20
O2 Saturation: 99.7 %
PEEP: 5 cmH2O
Patient temperature: 37
pCO2 arterial: 38 mmHg (ref 32.0–48.0)
pH, Arterial: 7.38 (ref 7.350–7.450)
pO2, Arterial: 198 mmHg — ABNORMAL HIGH (ref 83.0–108.0)

## 2020-11-06 LAB — BLOOD GAS, VENOUS
Acid-base deficit: 2.6 mmol/L — ABNORMAL HIGH (ref 0.0–2.0)
Bicarbonate: 26.7 mmol/L (ref 20.0–28.0)
O2 Saturation: 32.4 %
Patient temperature: 37
pCO2, Ven: 70 mmHg — ABNORMAL HIGH (ref 44.0–60.0)
pH, Ven: 7.19 — CL (ref 7.250–7.430)

## 2020-11-06 LAB — MAGNESIUM: Magnesium: 1.9 mg/dL (ref 1.7–2.4)

## 2020-11-06 LAB — TRIGLYCERIDES: Triglycerides: 104 mg/dL (ref <150)

## 2020-11-06 LAB — PHOSPHORUS: Phosphorus: 4.2 mg/dL (ref 2.5–4.6)

## 2020-11-06 LAB — CORTISOL: Cortisol, Plasma: 14.4 ug/dL

## 2020-11-06 MED ORDER — POLYVINYL ALCOHOL 1.4 % OP SOLN
1.0000 [drp] | OPHTHALMIC | Status: DC | PRN
  Administered 2020-11-06: 1 [drp] via OPHTHALMIC
  Filled 2020-11-06 (×2): qty 15

## 2020-11-06 MED ORDER — ROPINIROLE HCL 1 MG PO TABS
2.0000 mg | ORAL_TABLET | Freq: Every day | ORAL | Status: DC
  Administered 2020-11-06 – 2020-11-08 (×3): 2 mg via ORAL
  Filled 2020-11-06 (×3): qty 2

## 2020-11-06 MED ORDER — LORATADINE 10 MG PO TABS
10.0000 mg | ORAL_TABLET | Freq: Every day | ORAL | Status: DC
  Administered 2020-11-06 – 2020-11-09 (×4): 10 mg via ORAL
  Filled 2020-11-06 (×4): qty 1

## 2020-11-06 MED ORDER — DOCUSATE SODIUM 100 MG PO CAPS
100.0000 mg | ORAL_CAPSULE | Freq: Two times a day (BID) | ORAL | Status: DC
  Administered 2020-11-07 – 2020-11-09 (×4): 100 mg via ORAL
  Filled 2020-11-06 (×5): qty 1

## 2020-11-06 MED ORDER — AZITHROMYCIN 500 MG PO TABS
500.0000 mg | ORAL_TABLET | Freq: Every day | ORAL | Status: DC
  Administered 2020-11-07 – 2020-11-09 (×3): 500 mg via ORAL
  Filled 2020-11-06 (×3): qty 1

## 2020-11-06 MED ORDER — MIRTAZAPINE 15 MG PO TABS: 7.5000 mg | ORAL_TABLET | Freq: Every day | ORAL | Status: DC

## 2020-11-06 MED ORDER — SODIUM CHLORIDE 0.9 % IV SOLN
3.0000 g | Freq: Four times a day (QID) | INTRAVENOUS | Status: DC
  Administered 2020-11-06 – 2020-11-09 (×11): 3 g via INTRAVENOUS
  Filled 2020-11-06: qty 3
  Filled 2020-11-06 (×5): qty 8
  Filled 2020-11-06: qty 3
  Filled 2020-11-06: qty 8
  Filled 2020-11-06: qty 3
  Filled 2020-11-06: qty 8
  Filled 2020-11-06 (×2): qty 3
  Filled 2020-11-06 (×3): qty 8

## 2020-11-06 MED ORDER — LITHIUM CARBONATE 300 MG PO CAPS
300.0000 mg | ORAL_CAPSULE | Freq: Two times a day (BID) | ORAL | Status: DC
  Administered 2020-11-06 – 2020-11-09 (×6): 300 mg via ORAL
  Filled 2020-11-06 (×7): qty 1

## 2020-11-06 MED ORDER — TRAZODONE HCL 50 MG PO TABS
50.0000 mg | ORAL_TABLET | Freq: Every day | ORAL | Status: DC
  Administered 2020-11-06 – 2020-11-08 (×3): 100 mg via ORAL
  Filled 2020-11-06 (×3): qty 2

## 2020-11-06 MED ORDER — ATORVASTATIN CALCIUM 20 MG PO TABS
40.0000 mg | ORAL_TABLET | Freq: Every evening | ORAL | Status: DC
  Administered 2020-11-06 – 2020-11-08 (×3): 40 mg via ORAL
  Filled 2020-11-06 (×3): qty 2

## 2020-11-06 MED ORDER — ORAL CARE MOUTH RINSE
15.0000 mL | Freq: Two times a day (BID) | OROMUCOSAL | Status: DC
  Administered 2020-11-07 – 2020-11-09 (×4): 15 mL via OROMUCOSAL

## 2020-11-06 MED ORDER — PANTOPRAZOLE SODIUM 40 MG PO TBEC
40.0000 mg | DELAYED_RELEASE_TABLET | Freq: Every day | ORAL | Status: DC
  Administered 2020-11-07 – 2020-11-09 (×3): 40 mg via ORAL
  Filled 2020-11-06 (×3): qty 1

## 2020-11-06 MED ORDER — CARBIDOPA-LEVODOPA 25-100 MG PO TABS
1.0000 | ORAL_TABLET | Freq: Four times a day (QID) | ORAL | Status: DC
  Administered 2020-11-06 – 2020-11-09 (×12): 1 via ORAL
  Filled 2020-11-06 (×13): qty 1

## 2020-11-06 MED ORDER — VANCOMYCIN HCL 1250 MG/250ML IV SOLN
1250.0000 mg | INTRAVENOUS | Status: DC
  Administered 2020-11-06: 1250 mg via INTRAVENOUS
  Filled 2020-11-06: qty 250

## 2020-11-06 NOTE — Progress Notes (Signed)
PHARMACIST - PHYSICIAN COMMUNICATION  CONCERNING: Antibiotic IV to Oral Route Change Policy  RECOMMENDATION: This patient is receiving azithromycin by the intravenous route.  Based on criteria approved by the Pharmacy and Therapeutics Committee, the antibiotic(s) is/are being converted to the equivalent oral dose form(s).   DESCRIPTION: These criteria include:  Patient being treated for a respiratory tract infection, urinary tract infection, cellulitis or clostridium difficile associated diarrhea if on metronidazole  The patient is not neutropenic and does not exhibit a GI malabsorption state  The patient is eating (either orally or via tube) and/or has been taking other orally administered medications for a least 24 hours  The patient is improving clinically and has a Tmax < 100.5  If you have questions about this conversion, please contact the Pharmacy Department   Benita Gutter  11/06/20

## 2020-11-06 NOTE — H&P (Signed)
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  Patient is currently intubated and mechanically ventilated on sedation, he is unable to provide history so history was mostly obtained from patient's wife who is currently at the bedside and ED notes.  Per patient's wife, patient was recently discharged on 11/02/2020 following patient for AKI secondary to obstructive uropathy, and asymptomatic pyuria.  Since discharge patient's wife states that he has not been feeling well, has been complaining of generalized weakness, shortness of breath and fever.  He was seen by his primary care physician on 11/03/2020 for suspected acute lower respiratory infection and was sent home on azithromycin and cough syrups.  His wife states this morning he woke up not feeling well and was attempting to go to the bathroom when she noticed that he was unsteady on his feet prior to falling on the floor. Patient's wife initially thought he had taken too much Tussionex with Codeine therefore she called EMS for lift assist back in the bed. EMS was called a second time due to worsening mental status post fall.    Significant Hospital Events   11/05/2020>Admit to ICU with acute hypoxic hypercapneic respiratory failure 6/6 remains intubated, off pressors, plan for SAT/SBT  Consults:  Urology  Procedures:  11/05/2020: Intubation  Significant Diagnostic Tests:  11/05/2020: Chest Xray>showed  right upper lobe opacity concerning for pneumonia without overt edema, effusion, pneumothorax, rib fracture or any other clear acute intrathoracic process 11/05/2020: Noncontrast CT head> no acute intracranial abnormality 11/05/2020: CTA  Chest, abdomen and pelvis>showed no evidence of PE but was remarkable for bilateral pneumonia concerning for possible aspiration pneumonia as well as some debris's in the airway 11/05/2020: CT L,T and cervical spine >spine showed no clear acute intracranial or acute C-spine injury  Micro Data:  6/5: SARS-CoV-2 PCR>> negative 6/5: Influenza PCR>> negative 6/5: Blood culture x2>> 6/5: Urine Culture>> 6/5: MRSA PCR>>  6/5: Strep pneumo urinary antigen>  Antimicrobials:  Vancomycin: 6/5>> Cefepime: 6/5> Azithromycin:6/5>    INTERVAL HISTORY Remains on vent Remains critically ill Plan for SAT/SBT      OBJECTIVE  Blood pressure (!) 148/61, pulse (!) 53, temperature 97.7 F (36.5 C), temperature source Oral, resp. rate 20, height 5' 8"  (1.727 m), weight 72.1 kg, SpO2 100 %.    Vent Mode: PRVC FiO2 (%):  [55 %-80 %] 55 % Set Rate:  [20 bmp] 20 bmp Vt Set:  [450 mL] 450 mL PEEP:  [5 cmH20] 5 cmH20   Intake/Output Summary (Last 24 hours) at 11/06/2020 0706 Last data filed at 11/06/2020 4627 Gross per 24 hour  Intake 5504.07 ml  Output 950 ml  Net 4554.07 ml   Filed Weights   11/05/20 0820 11/05/20 1348 11/06/20 0407  Weight: 70 kg 67.7 kg 72.1 kg    REVIEW OF SYSTEMS  PATIENT IS UNABLE TO PROVIDE COMPLETE REVIEW OF SYSTEMS DUE TO SEVERE CRITICAL ILLNESS AND TOXIC METABOLIC ENCEPHALOPATHY   PHYSICAL EXAMINATION:  GENERAL:critically ill appearing, +resp distress HEAD: Normocephalic, atraumatic.  EYES: Pupils equal, round, reactive  to light.  No scleral icterus.  MOUTH: Moist mucosal membrane. NECK: Supple. No thyromegaly. No nodules. No JVD.  PULMONARY: +rhonchi, +wheezing CARDIOVASCULAR: S1 and S2. Regular rate and rhythm. No murmurs,  rubs, or gallops.  GASTROINTESTINAL: Soft, nontender, -distended. Positive bowel sounds.  MUSCULOSKELETAL: No swelling, clubbing, or edema.  NEUROLOGIC: obtunded SKIN:intact,warm,dry     ALL OTHER ROS ARE NEGATIVE   Labs/imaging that I havepersonally reviewed  (right click and "Reselect all SmartList Selections" daily)     Labs   CBC: Recent Labs  Lab 10/31/20 1412 11/01/20 0201 11/02/20 0405 11/05/20 0753 11/06/20 0421  WBC 20.1* 15.0* 6.8 30.9* 20.9*  NEUTROABS  --  12.7*  --  28.1* 20.0*  HGB 10.8* 10.5* 9.7* 11.1* 9.6*  HCT 32.7* 31.1* 30.4* 34.9* 29.4*  MCV 98.5 99.4 101.0* 103.6* 102.1*  PLT 177 184 153 278 097    Basic Metabolic Panel: Recent Labs  Lab 11/01/20 0201 11/01/20 0525 11/02/20 0405 11/05/20 0754 11/06/20 0421  NA 135 134* 139 137 137  K 4.0 4.0 4.3 4.2 4.6  CL 102 102 108 103 107  CO2 26 25 25 23 22   GLUCOSE 81 128* 147* 334* 167*  BUN 73* 68* 45* 27* 24*  CREATININE 3.27* 2.76* 1.30* 1.32* 0.97  CALCIUM 9.7 9.4 9.4 9.9 9.7  MG 3.7*  --   --  2.1 1.9  PHOS  --   --   --   --  4.2   GFR: Estimated Creatinine Clearance: 56.8 mL/min (by C-G formula based on SCr of 0.97 mg/dL). Recent Labs  Lab 11/01/20 0201 11/02/20 0405 11/05/20 0753 11/05/20 0754 11/05/20 0949 11/05/20 1021 11/06/20 0421  PROCALCITON  --   --   --  0.41  --   --   --   WBC 15.0* 6.8 30.9*  --   --   --  20.9*  LATICACIDVEN  --   --   --   --  2.8* 1.8  --     Liver Function Tests: Recent Labs  Lab 11/01/20 0201 11/05/20 0754  AST 11* 16  ALT 15 12  ALKPHOS 63 81  BILITOT 0.7 0.6  PROT 5.8* 6.6  ALBUMIN 2.7* 3.3*   No results for input(s): LIPASE, AMYLASE in the last 168 hours. No results for input(s): AMMONIA in the last 168 hours.  ABG    Component Value Date/Time   PHART 7.38 11/06/2020 0500   PCO2ART 38 11/06/2020 0500   PO2ART 198 (H) 11/06/2020 0500   HCO3 22.5 11/06/2020 0500   ACIDBASEDEF 2.3 (H) 11/06/2020 0500   O2SAT 99.7 11/06/2020  0500     Coagulation Profile: Recent Labs  Lab 11/05/20 0754  INR 1.2    Cardiac Enzymes: Recent Labs  Lab 11/05/20 0754  CKTOTAL 260    HbA1C: Hgb A1c MFr Bld  Date/Time Value Ref Range Status  10/31/2020 02:12 PM 5.6 4.8 - 5.6 % Final    Comment:    (NOTE)         Prediabetes: 5.7 - 6.4         Diabetes: >6.4         Glycemic control for adults with diabetes: <7.0   07/02/2016 05:48 AM 5.7 (H) 4.8 - 5.6 % Final    Comment:    (NOTE)         Pre-diabetes: 5.7 - 6.4         Diabetes: >6.4         Glycemic control for  adults with diabetes: <7.0     CBG: Recent Labs  Lab 11/05/20 1353 11/05/20 1541 11/05/20 1943 11/05/20 2329 11/06/20 0321  GLUCAP 115* 67 129* 138* 134*      Active Hospital Problem list   Acute hypoxic hypercapnic respiratory failure secondary to multifocal/aspiration pneumonia Sepsis AKI NSTEMI Acute metabolic encephalopathy Obstructive uropathy Hypertension Diabetes mellitus/   Assessment & Plan:   Severe ACUTE Hypoxic and Hypercapnic Respiratory Failure due to multifocal pneumonia with severe aspiration pneumonitis due to severe encephalopathy   -continue Mechanical Ventilator support   Severe ACUTE Hypoxic and Hypercapnic Respiratory Failure -continue Mechanical Ventilator support -continue Bronchodilator Therapy -Wean Fio2 and PEEP as tolerated -VAP/VENT bundle implementation -will perform SAT/SBT when respiratory parameters are met    Septic shock -use vasopressors to keep MAP>65 as needed -follow ABG and LA -follow up cultures -emperic ABX   ACUTE SYSTOLIC CARDIAC FAILURE/NSTEMIcheck ECHO -oxygen as needed -Lasix as tolerated -follow up cardiac enzymes as indicated -follow up cardiology recs  -Anti-coagulation (Heparin drip per ACS protocol) -Cardiology Consult / Interventional Assessment   NEUROLOGY ACUTE TOXIC METABOLIC ENCEPHALOPATHY -need for sedation -Goal RASS -2 to -3   Urinary  Retention Obstructive uropathy from bladder outlet obstruction PMHx: Prostate Cancer s/p s/p radical prostatectomy with sphincter placement -s/pSphincter is deactivation with 14 French catheter in place by Urology -Per Urology, needs remain in place for more than 3 or 4 days.  If he needs prolonged catheterization recommend transition to a suprapubic tube by interventional radiology under imaging guidance -Urology input appreciated  ACUTE KIDNEY INJURY/Renal Failure -continue Foley Catheter-assess need -Avoid nephrotoxic agents -Follow urine output, BMP -Ensure adequate renal perfusion, optimize oxygenation -Renal dose medications    ENDO/DM - ICU hypoglycemic\Hyperglycemia protocol -check FSBS per protocol        Best practice:  Diet:  Oral Pain/Anxiety/Delirium protocol (if indicated): Yes (RASS goal -1) VAP protocol (if indicated): Yes DVT prophylaxis: Systemic AC GI prophylaxis: PPI Glucose control:  SSI Yes Central venous access:  N/A Arterial line:  N/A Foley:  Yes, and it is still needed Mobility:  bed rest  PT consulted: N/A Last date of multidisciplinary goals of care discussion [6/5] Code Status:  full code Disposition: ICU    DVT/GI PRX  assessed I Assessed the need for Labs I Assessed the need for Foley I Assessed the need for Central Venous Line Family Discussion when available I Assessed the need for Mobilization I made an Assessment of medications to be adjusted accordingly Safety Risk assessment completed  CASE DISCUSSED IN MULTIDISCIPLINARY ROUNDS WITH ICU TEAM     Critical Care Time devoted to patient care services described in this note is 45  minutes.  Critical care was necessary to treat /prevent imminent and life-threatening deterioration. Overall, patient is critically ill, prognosis is guarded.  Patient with Multiorgan failure and at high risk for cardiac arrest and death.    Corrin Parker, M.D.  [facility]  Medical Director [place] Director [facility] Cardio-Pulmonary Department      .

## 2020-11-06 NOTE — Progress Notes (Signed)
PHARMACIST - PHYSICIAN COMMUNICATION  CONCERNING: IV to Oral Route Change Policy  RECOMMENDATION: This patient is receiving pantoprazole by the intravenous route.  Based on criteria approved by the Pharmacy and Therapeutics Committee, the intravenous medication(s) is/are being converted to the equivalent oral dose form(s).   DESCRIPTION: These criteria include:  The patient is eating (either orally or via tube) and/or has been taking other orally administered medications for a least 24 hours  The patient has no evidence of active gastrointestinal bleeding or impaired GI absorption (gastrectomy, short bowel, patient on TNA or NPO).  If you have questions about this conversion, please contact the [place], [facility] 11/06/2020 3:01 PM

## 2020-11-06 NOTE — Progress Notes (Signed)
Pt extubated per MD orders, pt placed on 3Lnc

## 2020-11-06 NOTE — Progress Notes (Signed)
[facility] hospitalist service acceptance note  80 year old male history of prostate cancer status post radical prostatectomy and radiation presents to [facility] after being found down unresponsive by his wife.  Was emergently intubated on admission.  Etiology of unresponsiveness felt to be due to hypoxic and hypercarbic respiratory failure in the setting of multifocal pneumonia with severe aspiration pneumonitis.  Patient was successfully extubated on 6/5.  Currently not in shock, on no drips.  Stable for transfer to [facility] service  [place] to assume primary care of this patient on 6/7 Signout received via secure chat from [facility] attending  Ralene Muskrat MD

## 2020-11-06 NOTE — Consult Note (Signed)
[place] for Electrolyte Monitoring and Replacement   Recent Labs: Potassium (mmol/L)  Date Value  11/06/2020 4.6  05/10/2014 4.8   Magnesium (mg/dL)  Date Value  11/06/2020 1.9   Calcium (mg/dL)  Date Value  11/06/2020 9.7   Calcium, Total (mg/dL)  Date Value  05/10/2014 10.0   Albumin (g/dL)  Date Value  11/05/2020 3.3 (L)   Phosphorus (mg/dL)  Date Value  11/06/2020 4.2   Sodium (mmol/L)  Date Value  11/06/2020 137  05/10/2014 140    Assessment: 80 yo M presenting with AMS. PMH includes HTN, HLD, diabetes, and prostate cancer s/p prostatectomy. 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 The device was subsequently deactivated d/t urinary retention. Pt was admitted 5/31-6/2 after receiving treatment for an AKI and UTI. Patient is currently admitted with pneumonia. Pharmacy has been consulted to monitor and replace electrolytes.   Patient intubated 6/5. Extubated 6/6.   Goal of Therapy:  Electrolytes within normal limits  Plan:  --No electrolyte replacement warranted at this time --Follow-up electrolytes with AM labs tomorrow  Benita Gutter 11/06/2020 1:04 PM

## 2020-11-07 DIAGNOSIS — J9601 Acute respiratory failure with hypoxia: Secondary | ICD-10-CM

## 2020-11-07 DIAGNOSIS — R4182 Altered mental status, unspecified: Secondary | ICD-10-CM

## 2020-11-07 LAB — BASIC METABOLIC PANEL
Anion gap: 7 (ref 5–15)
BUN: 25 mg/dL — ABNORMAL HIGH (ref 8–23)
CO2: 20 mmol/L — ABNORMAL LOW (ref 22–32)
Calcium: 9.5 mg/dL (ref 8.9–10.3)
Chloride: 110 mmol/L (ref 98–111)
Creatinine, Ser: 1.05 mg/dL (ref 0.61–1.24)
GFR, Estimated: >60 mL/min (ref >60)
Glucose, Bld: 131 mg/dL — ABNORMAL HIGH (ref 70–99)
Potassium: 4.3 mmol/L (ref 3.5–5.1)
Sodium: 137 mmol/L (ref 135–145)

## 2020-11-07 LAB — MYCOPLASMA PNEUMONIAE ANTIBODY, IGM: Mycoplasma pneumo IgM: <770 U/mL (ref 0–769)

## 2020-11-07 LAB — GLUCOSE, CAPILLARY
Glucose-Capillary: 116 mg/dL — ABNORMAL HIGH (ref 70–99)
Glucose-Capillary: 150 mg/dL — ABNORMAL HIGH (ref 70–99)
Glucose-Capillary: 272 mg/dL — ABNORMAL HIGH (ref 70–99)
Glucose-Capillary: 246 mg/dL — ABNORMAL HIGH (ref 70–99)
Glucose-Capillary: 188 mg/dL — ABNORMAL HIGH (ref 70–99)

## 2020-11-07 LAB — PHOSPHORUS: Phosphorus: 3.1 mg/dL (ref 2.5–4.6)

## 2020-11-07 LAB — CBC
HCT: 28.4 % — ABNORMAL LOW (ref 39.0–52.0)
Hemoglobin: 8.9 g/dL — ABNORMAL LOW (ref 13.0–17.0)
MCH: 32.7 pg (ref 26.0–34.0)
MCHC: 31.3 g/dL (ref 30.0–36.0)
MCV: 104.4 fL — ABNORMAL HIGH (ref 80.0–100.0)
Platelets: 160 10*3/uL (ref 150–400)
RBC: 2.72 MIL/uL — ABNORMAL LOW (ref 4.22–5.81)
RDW: 12 % (ref 11.5–15.5)
WBC: 16.6 10*3/uL — ABNORMAL HIGH (ref 4.0–10.5)
nRBC: 0 % (ref 0.0–0.2)

## 2020-11-07 LAB — LEGIONELLA PNEUMOPHILA SEROGP 1 UR AG: L. pneumophila Serogp 1 Ur Ag: NEGATIVE

## 2020-11-07 LAB — IRON AND TIBC
Iron: 58 ug/dL (ref 45–182)
Saturation Ratios: 33 % (ref 17.9–39.5)
TIBC: 176 ug/dL — ABNORMAL LOW (ref 250–450)
UIBC: 118 ug/dL

## 2020-11-07 LAB — FOLATE: Folate: 9.6 ng/mL (ref >5.9)

## 2020-11-07 LAB — MAGNESIUM: Magnesium: 2 mg/dL (ref 1.7–2.4)

## 2020-11-07 LAB — RETICULOCYTES
Immature Retic Fract: 16.3 % — ABNORMAL HIGH (ref 2.3–15.9)
RBC.: 2.72 MIL/uL — ABNORMAL LOW (ref 4.22–5.81)
Retic Count, Absolute: 66.9 10*3/uL (ref 19.0–186.0)
Retic Ct Pct: 2.5 % (ref 0.4–3.1)

## 2020-11-07 LAB — VITAMIN B12: Vitamin B-12: 727 pg/mL (ref 180–914)

## 2020-11-07 LAB — FERRITIN: Ferritin: 227 ng/mL (ref 24–336)

## 2020-11-07 NOTE — Progress Notes (Signed)
Pt transferred to room 115, report given to [employer], wife [name] notified.

## 2020-11-07 NOTE — Progress Notes (Signed)
     Referral received for Craig Wilson :goals of care discussion. Chart reviewed and updates received from RN. Patient assessed.    I was able to speak with patient's wife, Kern Gingras. Unfortunately she [place] and had to be taken to the ED. She states she has a concussion, required 8 scalp sutures, and is sore all over. She is unable to visit with patient at this time.   She requested I give her a call tomorrow morning and depending on her condition would like to meet face-to-face, if she is unable to come we will plan to then have further discussions over the phone.   Detailed note and recommendations to follow once GOC has been completed.   Thank you for your referral and allowing [facility] to assist in Craig Wilson's care.   Alda Lea, AGPCNP-BC Palliative Medicine Team  Pager: [phone] Amion: N. [place]   NO CHARGE

## 2020-11-07 NOTE — Progress Notes (Addendum)
PROGRESS NOTE  ICU transfer  Craig Wilson  BPZ:[id number] DOB: 11-Jan-1941 DOA: 11/05/2020 PCP: Adin Hector, MD   Brief Narrative: Taken from prior notes. 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 He was emergently intubated to protect airway and for profound acute hypoxic and hypercarbic respiratory failure. Patient was recently admitted from 5/31-11/02/2020 secondary to AKI with urinary obstruction. Her artificial urinary sphincter was replaced in February 2022 and then activated in the later may 2022 with resulted in urinary obstruction.  It was deactivated.  Creatinine improved and he was discharged home to follow-up with his urologist as an outpatient. Recently started on Zithromax and cough syrup by his PCP where he presented with generalized malaise.  On presentation patient was hypoxic, hypercarbic, leukocytosis, elevated troponin and lactic acid.  Chest x-ray with concern of right upper lobe pneumonia, CTA without any PE but did show bilateral pneumonia and a concern of possible aspiration as there was some debris's in the airway.  Because of his artificial urethral sphincter urology was consulted who deactivated sphincter and placed a Foley catheter.  Patient was successfully extubated on 11/06/2020-appears stable and TRH to resume care on 11/07/2020.  Subjective: Patient continued to have some cough, no other complaints.  Assessment & Plan:   Active Problems:   Sepsis due to pneumonia ([place])  Sepsis secondary to pneumonia.  Initially required intubation.  Concern of aspiration as there was food debris was found in airway.  Clinically seems  improving on Unasyn.  Currently on room air -Continue Unasyn and Zithromax. -Continue with supportive care -Pulmonary toileting -Incentive spirometry and flutter valve. -PT/OT evaluation.  AKI.  Resolved. Foley catheter was removed when asked by nursing staff and indication given was intubation, later noticed the urology note regarding keeping Foley for 3 to 4 days-if unable to void or significant post wired volume then he will need replacement of Foley catheter and follow-up with urology as an outpatient.  Anemia.  Hemoglobin at 8.9 and elevated MCV -Check anemia panel -Monitor hemoglobin -Transfuse if below 7  Parkinson's disease. -Continue Sinemet.  Essential hypertension.  Home dose of Cozaar was discontinued during prior hospitalization due to AKI.  Blood pressure within goal. -Continue to monitor  Hyperlipidemia. -Continue statin  Type 2 diabetes mellitus. -SSI  Elevated troponin.  Most likely secondary to demand ischemia.  No chest pain. Unlikely ACS   Objective: Vitals:   11/07/20 0459 11/07/20 0500 11/07/20 0800 11/07/20 0912  BP:  (!) 134/45 (!) 145/50   Pulse:  74 78   Resp:  16 20   Temp:   98 F (36.7 C)   TempSrc:   Axillary   SpO2:  93% 95% 95%  Weight: 69.8 kg     Height:        Intake/Output Summary (Last 24 hours) at 11/07/2020 1358 Last data filed at 11/07/2020 1150 Gross per 24 hour  Intake 360 ml  Output 2950 ml  Net -2590 ml   Filed Weights   11/05/20 1348 11/06/20 0407 11/07/20 0459  Weight: 67.7 kg 72.1 kg 69.8 kg    Examination:  General exam: Appears calm and comfortable  Respiratory system: Clear to auscultation. Respiratory effort normal. Cardiovascular system: S1 & S2 heard, RRR.  Gastrointestinal  system: Soft, nontender, nondistended, bowel sounds positive. Central nervous system: Alert and oriented. No focal neurological deficits.Symmetric 5 x 5 power. Extremities: No edema, no cyanosis, pulses intact and  symmetrical. Psychiatry: Judgement and insight appear normal.    DVT prophylaxis: Heparin Code Status: Full Family Communication: Updated the wife on phone, apparently she had a fall yesterday and will not be able to come to the hospital today. Disposition Plan:  Status is: Inpatient  Remains inpatient appropriate because:Inpatient level of care appropriate due to severity of illness   Dispo: The patient is from: Home              Anticipated d/c is to: Home              Patient currently is not medically stable to d/c.   Difficult to place patient No              Level of care: Med-Surg  All the records are reviewed and case discussed with Care Management/Social Worker. Management plans discussed with the patient, nursing and they are in agreement.  Consultants:   PCCM  Urology  Procedures:  Antimicrobials:  Unasyn Azithromax  Data Reviewed: I have personally reviewed following labs and imaging studies  CBC: Recent Labs  Lab 11/01/20 0201 11/02/20 0405 11/05/20 0753 11/06/20 0421 11/07/20 0417  WBC 15.0* 6.8 30.9* 20.9* 16.6*  NEUTROABS 12.7*  --  28.1* 20.0*  --   HGB 10.5* 9.7* 11.1* 9.6* 8.9*  HCT 31.1* 30.4* 34.9* 29.4* 28.4*  MCV 99.4 101.0* 103.6* 102.1* 104.4*  PLT 184 153 278 184 016   Basic Metabolic Panel: Recent Labs  Lab 11/01/20 0201 11/01/20 0525 11/02/20 0405 11/05/20 0754 11/06/20 0421 11/07/20 0417  NA 135 134* 139 137 137 137  K 4.0 4.0 4.3 4.2 4.6 4.3  CL 102 102 108 103 107 110  CO2 _0 20*  GLUCOSE 81 128* 147* 334* 167* 131*  BUN 73* 68* 45* 27* 24* 25*  CREATININE 3.27* 2.76* 1.30* 1.32* 0.97 1.05  CALCIUM 9.7 9.4 9.4 9.9 9.7 9.5  MG 3.7*  --   --  2.1 1.9 2.0  PHOS  --   --   --   --  4.2 3.1   GFR: Estimated Creatinine Clearance: 52.5 mL/min (by C-G formula based on SCr of 1.05 mg/dL). Liver Function Tests: Recent Labs  Lab 11/01/20 0201 11/05/20 0754  AST 11* 16  ALT 15 12  ALKPHOS 63 81  BILITOT 0.7  0.6  PROT 5.8* 6.6  ALBUMIN 2.7* 3.3*   No results for input(s): LIPASE, AMYLASE in the last 168 hours. No results for input(s): AMMONIA in the last 168 hours. Coagulation Profile: Recent Labs  Lab 11/05/20 0754  INR 1.2   Cardiac Enzymes: Recent Labs  Lab 11/05/20 0754  CKTOTAL 260   BNP (last 3 results) No results for input(s): PROBNP in the last 8760 hours. HbA1C: No results for input(s): HGBA1C in the last 72 hours. CBG: Recent Labs  Lab 11/06/20 1943 11/06/20 2345 11/07/20 0327 11/07/20 0730 11/07/20 1145  GLUCAP 199* 124* 116* 150* 272*   Lipid Profile: Recent Labs    11/06/20 0421  TRIG 104   Thyroid Function Tests: No results for input(s): TSH, T4TOTAL, FREET4, T3FREE, THYROIDAB in the last 72 hours. Anemia Panel: No results for input(s): VITAMINB12, FOLATE, FERRITIN, TIBC, IRON, RETICCTPCT in the last 72 hours. Sepsis Labs: Recent Labs  Lab 11/05/20 0754 11/05/20 0949 11/05/20 1021  PROCALCITON  0.41  --   --   LATICACIDVEN  --  2.8* 1.8    Recent Results (from the past 240 hour(s))  Urine culture     Status: Abnormal   Collection Time: 10/31/20  3:02 PM   Specimen: Urine, Random  Result Value Ref Range Status   Specimen Description   Final    URINE, RANDOM Performed at [facility], [address] [place], [place] [address]    Special Requests   Final    NONE Performed at [facility], [address] [place], [place] [address]    Culture MULTIPLE SPECIES PRESENT, SUGGEST RECOLLECTION (A)  Final   Report Status 11/02/2020 FINAL  Final  SARS CORONAVIRUS 2 (TAT 6-24 HRS) Nasopharyngeal Nasopharyngeal Swab     Status: None   Collection Time: 10/31/20  3:24 PM   Specimen: Nasopharyngeal Swab  Result Value Ref Range Status   SARS Coronavirus 2 NEGATIVE NEGATIVE Final    Comment: (NOTE) SARS-CoV-2 target nucleic acids are NOT DETECTED.  The SARS-CoV-2 RNA is generally detectable in upper and lower respiratory  specimens during the acute phase of infection. Negative results do not preclude SARS-CoV-2 infection, do not rule out co-infections with other pathogens, and should not be used as the sole basis for treatment or other patient management decisions. Negative results must be combined with clinical observations, patient history, and epidemiological information. The expected result is Negative.  Fact Sheet for Patients: SugarRoll.be  Fact Sheet for Healthcare Providers: https://www.woods-mathews.com/  This test is not yet approved or cleared by the [place] FDA and  has been authorized for detection and/or diagnosis of SARS-CoV-2 by FDA under an Emergency Use Authorization (EUA). This EUA will remain  in effect (meaning this test can be used) for the duration of the COVID-19 declaration under Se ction 564(b)(1) of the Act, 21 U.S.C. section 360bbb-3(b)(1), unless the authorization is terminated or revoked sooner.  Performed at [facility], [place] [address] [place], [place] [address]   Blood culture (routine single)     Status: None (Preliminary result)   Collection Time: 11/05/20  7:53 AM   Specimen: BLOOD  Result Value Ref Range Status   Specimen Description BLOOD LEFT ANTECUBITAL  Final   Special Requests   Final    BOTTLES DRAWN AEROBIC AND ANAEROBIC Blood Culture adequate volume   Culture   Final    NO GROWTH 2 DAYS Performed at [facility], [address], [place],  [address]    Report Status PENDING  Incomplete  Resp Panel by RT-PCR (Flu A&B, Covid) Nasopharyngeal Swab     Status: None   Collection Time: 11/05/20  7:54 AM   Specimen: Nasopharyngeal Swab; Nasopharyngeal(NP) swabs in vial transport medium  Result Value Ref Range Status   SARS Coronavirus 2 by RT PCR NEGATIVE NEGATIVE Final    Comment: (NOTE) SARS-CoV-2 target nucleic acids are NOT DETECTED.  The SARS-CoV-2 RNA is generally detectable in  upper respiratory specimens during the acute phase of infection. The lowest concentration of SARS-CoV-2 viral copies this assay can detect is 138 copies/mL. A negative result does not preclude SARS-Cov-2 infection and should not be used as the sole basis for treatment or other patient management decisions. A negative result may occur with  improper specimen collection/handling, submission of specimen other than nasopharyngeal swab, presence of viral mutation(s) within the areas targeted by this assay, and inadequate number of viral copies(<138 copies/mL). A negative result must be combined with clinical observations,  patient history, and epidemiological information. The expected result is Negative.  Fact Sheet for Patients:  EntrepreneurPulse.com.au  Fact Sheet for Healthcare Providers:  IncredibleEmployment.be  This test is no t yet approved or cleared by the [place] FDA and  has been authorized for detection and/or diagnosis of SARS-CoV-2 by FDA under an Emergency Use Authorization (EUA). This EUA will remain  in effect (meaning this test can be used) for the duration of the COVID-19 declaration under Section 564(b)(1) of the Act, 21 U.S.C.section 360bbb-3(b)(1), unless the authorization is terminated  or revoked sooner.       Influenza A by PCR NEGATIVE NEGATIVE Final   Influenza B by PCR NEGATIVE NEGATIVE Final    Comment: (NOTE) The Xpert Xpress SARS-CoV-2/FLU/RSV plus assay is intended as an aid in the diagnosis of influenza from Nasopharyngeal swab specimens and should not be used as a sole basis for treatment. Nasal washings and aspirates are unacceptable for Xpert Xpress SARS-CoV-2/FLU/RSV testing.  Fact Sheet for Patients: EntrepreneurPulse.com.au  Fact Sheet for Healthcare Providers: IncredibleEmployment.be  This test is not yet approved or cleared by the [place] FDA and has been  authorized for detection and/or diagnosis of SARS-CoV-2 by FDA under an Emergency Use Authorization (EUA). This EUA will remain in effect (meaning this test can be used) for the duration of the COVID-19 declaration under Section 564(b)(1) of the Act, 21 U.S.C. section 360bbb-3(b)(1), unless the authorization is terminated or revoked.  Performed at [facility], [address], [place], [place] [address]   Urine culture     Status: None   Collection Time: 11/05/20 10:21 AM   Specimen: In/Out Cath Urine  Result Value Ref Range Status   Specimen Description   Final    IN/OUT CATH URINE Performed at [facility], [address], [place], [place] [address]    Special Requests   Final    NONE Performed at [facility], [address], [place], [place] [address]    Culture   Final    NO GROWTH Performed at [facility], [place] [address] [place], [place] [address]    Report Status 11/06/2020 FINAL  Final  MRSA PCR Screening     Status: None   Collection Time: 11/05/20  1:54 PM   Specimen: Nasopharyngeal  Result Value Ref Range Status   MRSA by PCR NEGATIVE NEGATIVE Final    Comment:        The GeneXpert MRSA Assay (FDA approved for NASAL specimens only), is one component of a comprehensive MRSA colonization surveillance program. It is not intended to diagnose MRSA infection nor to guide or monitor treatment for MRSA infections. Performed at [facility], [address] [place], [place] [address]      Radiology Studies: DG Abd 1 View  Result Date: 11/05/2020 CLINICAL DATA:  Nasogastric tube placement EXAM: ABDOMEN - 1 VIEW COMPARISON:  Same day CT abdomen pelvis FINDINGS: Nasogastric tube with tip and side port in the stomach. The bowel gas pattern is normal. Surgical clips overlie the pelvis. Radiopaque artificial urinary sphincter reservoir and tubing visualized over the right pelvis. IMPRESSION: Nasogastric tube with tip and side  port in the stomach. Electronically Signed   By: Dahlia Bailiff MD   On: 11/05/2020 14:46    Scheduled Meds: . atorvastatin  40 mg Oral QPM  . azithromycin  500 mg Oral Daily  . budesonide (PULMICORT) nebulizer solution  0.25 mg Nebulization BID  . carbidopa-levodopa  1 tablet Oral  QID  . chlorhexidine gluconate (MEDLINE KIT)  15 mL Mouth Rinse BID  . Chlorhexidine Gluconate Cloth  6 each Topical Q0600  . docusate sodium  100 mg Oral BID  . heparin injection (subcutaneous)  5,000 Units Subcutaneous Q8H  . insulin aspart  0-15 Units Subcutaneous Q4H  . lithium carbonate  300 mg Oral BID  . loratadine  10 mg Oral Daily  . mouth rinse  15 mL Mouth Rinse q12n4p  . methylPREDNISolone (SOLU-MEDROL) injection  20 mg Intravenous Q12H  . pantoprazole  40 mg Oral Daily  . rOPINIRole  2 mg Oral QHS  . traZODone  50-100 mg Oral QHS   Continuous Infusions: . sodium chloride 10 mL/hr at 11/06/20 1132  . ampicillin-sulbactam (UNASYN) IV 3 g (11/07/20 1002)     LOS: 2 days   Time spent: 45 minutes. More than 50% of the time was spent in counseling/coordination of care  Lorella Nimrod, MD [facility]  If 7PM-7AM, please contact night-coverage [email or web address]  11/07/2020, 1:58 PM   This record has been created using Systems analyst. Errors have been sought and corrected,but may not always be located. Such creation errors do not reflect on the standard of care.

## 2020-11-08 ENCOUNTER — Ambulatory Visit: Payer: Medicare Other | Admitting: Urology

## 2020-11-08 DIAGNOSIS — R778 Other specified abnormalities of plasma proteins: Secondary | ICD-10-CM

## 2020-11-08 DIAGNOSIS — R32 Unspecified urinary incontinence: Secondary | ICD-10-CM

## 2020-11-08 DIAGNOSIS — N179 Acute kidney failure, unspecified: Secondary | ICD-10-CM

## 2020-11-08 DIAGNOSIS — G9341 Metabolic encephalopathy: Secondary | ICD-10-CM

## 2020-11-08 DIAGNOSIS — J690 Pneumonitis due to inhalation of food and vomit: Secondary | ICD-10-CM

## 2020-11-08 DIAGNOSIS — Y92009 Unspecified place in unspecified non-institutional (private) residence as the place of occurrence of the external cause: Secondary | ICD-10-CM

## 2020-11-08 DIAGNOSIS — W19XXXA Unspecified fall, initial encounter: Secondary | ICD-10-CM

## 2020-11-08 DIAGNOSIS — R6520 Severe sepsis without septic shock: Secondary | ICD-10-CM

## 2020-11-08 DIAGNOSIS — R7989 Other specified abnormal findings of blood chemistry: Secondary | ICD-10-CM

## 2020-11-08 LAB — GLUCOSE, CAPILLARY
Glucose-Capillary: 105 mg/dL — ABNORMAL HIGH (ref 70–99)
Glucose-Capillary: 105 mg/dL — ABNORMAL HIGH (ref 70–99)
Glucose-Capillary: 110 mg/dL — ABNORMAL HIGH (ref 70–99)
Glucose-Capillary: 150 mg/dL — ABNORMAL HIGH (ref 70–99)
Glucose-Capillary: 161 mg/dL — ABNORMAL HIGH (ref 70–99)
Glucose-Capillary: 164 mg/dL — ABNORMAL HIGH (ref 70–99)
Glucose-Capillary: 59 mg/dL — ABNORMAL LOW (ref 70–99)
Glucose-Capillary: 70 mg/dL (ref 70–99)

## 2020-11-08 LAB — CBC
HCT: 32.5 % — ABNORMAL LOW (ref 39.0–52.0)
Hemoglobin: 10.4 g/dL — ABNORMAL LOW (ref 13.0–17.0)
MCH: 33.1 pg (ref 26.0–34.0)
MCHC: 32 g/dL (ref 30.0–36.0)
MCV: 103.5 fL — ABNORMAL HIGH (ref 80.0–100.0)
Platelets: 154 10*3/uL (ref 150–400)
RBC: 3.14 MIL/uL — ABNORMAL LOW (ref 4.22–5.81)
RDW: 12 % (ref 11.5–15.5)
WBC: 11.9 10*3/uL — ABNORMAL HIGH (ref 4.0–10.5)
nRBC: 0 % (ref 0.0–0.2)

## 2020-11-08 LAB — BASIC METABOLIC PANEL
Anion gap: 8 (ref 5–15)
BUN: 22 mg/dL (ref 8–23)
CO2: 25 mmol/L (ref 22–32)
Calcium: 9.7 mg/dL (ref 8.9–10.3)
Chloride: 109 mmol/L (ref 98–111)
Creatinine, Ser: 1.03 mg/dL (ref 0.61–1.24)
GFR, Estimated: >60 mL/min (ref >60)
Glucose, Bld: 114 mg/dL — ABNORMAL HIGH (ref 70–99)
Potassium: 4.3 mmol/L (ref 3.5–5.1)
Sodium: 142 mmol/L (ref 135–145)

## 2020-11-08 MED ORDER — SALINE SPRAY 0.65 % NA SOLN
1.0000 | NASAL | Status: DC | PRN
  Filled 2020-11-08: qty 44

## 2020-11-08 MED ORDER — FLUTICASONE PROPIONATE 50 MCG/ACT NA SUSP
2.0000 | Freq: Every day | NASAL | Status: DC
  Administered 2020-11-08 – 2020-11-09 (×2): 2 via NASAL
  Filled 2020-11-08: qty 16

## 2020-11-08 NOTE — Evaluation (Signed)
Physical Therapy Evaluation Patient Details Name: Craig Wilson MRN: [id number] DOB: 02-28-1941 Today's Date: 11/08/2020   History of Present Illness  Craig Wilson is a 80 year old male with history of prostate cancer status post radical prostatectomy and radiation presents to [facility] after being found down unresponsive by his wife. Was emergently intubated on admission. Etiology of unresponsiveness felt to be due to hypoxic and hypercarbic respiratory failure in the setting of multifocal pneumonia with severe aspiration pneumonitis.  Clinical Impression  Pt eager to work with PT but ultimately was very limited and much weaker than his baseline.  He struggled to maintain sitting at EOB with very poor core strength/posture and inability to self correct as he shifted back.  He needed some assist with getting to standing and very much struggled with limited ambulation going just bed to recliner.  Quick to fatigue, unsteady and despite wanting to discharge home he understands that at this point he is too weak to go home at this point.  Motivated to work with PT and try to do more, though again very limited.      Follow Up Recommendations SNF;Supervision/Assistance - 24 hour    Equipment Recommendations   (TBD)    Recommendations for Other Services       Precautions / Restrictions Precautions Precautions: Fall Restrictions Weight Bearing Restrictions: No      Mobility  Bed Mobility Overal bed mobility: Needs Assistance Bed Mobility: Supine to Sit     Supine to sit: Min assist;Mod assist     General bed mobility comments: Pt showed good effort but was unable to elevate trunk even with heavy UE/rail use.  Pt struggled to stay upright at EOB w/o back support    Transfers Overall transfer level: Needs assistance Equipment used: Rolling walker (2 wheeled) Transfers: Sit to/from Stand Sit to Stand: Min assist Stand pivot transfers: Min guard       General transfer comment:  elevated bed 2-3", showed good effort but even with cuing for UE he struggled to attain standing w/o assist to shift forward and rise to upright  Ambulation/Gait Ambulation/Gait assistance: Min assist;Mod assist Gait Distance (Feet): 5 Feet Assistive device: Rolling walker (2 wheeled)       General Gait Details: Pt highly reliant on the walker; shuffling, guarded, hesitant gait with limited tolerance and poor balance.  Pt quick to fatigue, unsteady and despite good effort is far from his baseline.  Stairs            Wheelchair Mobility    Modified Rankin (Stroke Patients Only)       Balance Overall balance assessment: Needs assistance Sitting-balance support: Bilateral upper extremity supported Sitting balance-Leahy Scale: Poor Sitting balance - Comments: without back support pt was unable to consistently maintain upright at EOB.  Pt very much struggled to shift forward and despite much cuing to use UEs could not effectively maintain sitting posture more than 20-30 seconds before fading back.   Standing balance support: Bilateral upper extremity supported;Single extremity supported;During functional activity Standing balance-Leahy Scale: Fair Standing balance comment: reliant on UEs/walker, no LOBs with brief standing bout but very poor overall tolerance and safety awareness                             Pertinent Vitals/Pain Pain Assessment: No/denies pain    Home Living Family/patient expects to be discharged to:: Unsure (wants to go home, understands may need to do rehab) Living Arrangements:  Spouse/significant other Available Help at Discharge: Family;Friend(s) Type of Home: House Home Access: Stairs to enter   [employer] of Steps: 1 small step Home Layout: One level Home Equipment: Cane - single point;Grab bars - toilet;Grab bars - tub/shower      Prior Function Level of Independence: Independent               Hand Dominance         Extremity/Trunk Assessment   Upper Extremity Assessment Upper Extremity Assessment: Generalized weakness    Lower Extremity Assessment Lower Extremity Assessment: Generalized weakness       Communication   Communication: No difficulties  Cognition Arousal/Alertness: Awake/alert Behavior During Therapy: WFL for tasks assessed/performed Overall Cognitive Status: Within Functional Limits for tasks assessed                                 General Comments: A&O x 4, pleasant, talkative      General Comments General comments (skin integrity, edema, etc.): Pt is far from his baseline, eager to go home but does acknowledge that this is not safe right now    Exercises Total Joint Exercises Ankle Circles/Pumps: AROM;10 reps;Seated;Both Quad Sets: AROM;10 reps;Seated Other Exercises Other Exercises: transfers, sit<>stand, balance tolerance, grooming, dressing, toileting, therex   Assessment/Plan    PT Assessment Patient needs continued PT services  PT Problem List Decreased strength;Decreased range of motion;Decreased activity tolerance;Decreased balance;Decreased mobility;Decreased knowledge of use of DME;Decreased safety awareness;Decreased cognition       PT Treatment Interventions Stair training;DME instruction;Gait training;Functional mobility training;Therapeutic activities;Therapeutic exercise;Balance training;Cognitive remediation;Patient/family education    PT Goals (Current goals can be found in the Care Plan section)  Acute Rehab PT Goals Patient Stated Goal: to get home PT Goal Formulation: With patient Time For Goal Achievement: 11/22/20 Potential to Achieve Goals: Fair    Frequency Min 2X/week   Barriers to discharge        Co-evaluation               AM-PAC PT "6 Clicks" Mobility  Outcome Measure Help needed turning from your back to your side while in a flat bed without using bedrails?: A Lot Help needed moving from lying on  your back to sitting on the side of a flat bed without using bedrails?: A Lot Help needed moving to and from a bed to a chair (including a wheelchair)?: A Lot Help needed standing up from a chair using your arms (e.g., wheelchair or bedside chair)?: A Lot Help needed to walk in hospital room?: A Lot Help needed climbing 3-5 steps with a railing? : Total 6 Click Score: 11    End of Session Equipment Utilized During Treatment: Gait belt Activity Tolerance: Patient limited by fatigue Patient left: with call bell/phone within reach;with chair alarm set Nurse Communication: Mobility status PT Visit Diagnosis: Muscle weakness (generalized) (M62.81);Difficulty in walking, not elsewhere classified (R26.2);Unsteadiness on feet (R26.81)    Time: 6962-9528 PT Time Calculation (min) (ACUTE ONLY): 34 min   Charges:   PT Evaluation $PT Eval Low Complexity: 1 Low PT Treatments $Therapeutic Activity: 8-22 mins        Kreg Shropshire, DPT 11/08/2020, 1:14 PM

## 2020-11-08 NOTE — Plan of Care (Signed)
Pt orientedx4, NSR on tele, adequate O2 sats on room air. No c/o pain. Sacral foam changed overnight. Male purewick in place with adequate UO, continuous dribble of urine. No BM this shift, passing flatus. IV ABX given w/o issue. Q2hr turns maintained, fall/safety precautions in place, rounding performed.   Problem: Education: Goal: Knowledge of General Education information will improve Description: Including pain rating scale, medication(s)/side effects and non-pharmacologic comfort measures Outcome: Progressing   Problem: Health Behavior/Discharge Planning: Goal: Ability to manage health-related needs will improve Outcome: Progressing   Problem: Clinical Measurements: Goal: Ability to maintain clinical measurements within normal limits will improve Outcome: Progressing Goal: Will remain free from infection Outcome: Progressing Goal: Diagnostic test results will improve Outcome: Progressing Goal: Respiratory complications will improve Outcome: Progressing Goal: Cardiovascular complication will be avoided Outcome: Progressing   Problem: Activity: Goal: Risk for activity intolerance will decrease Outcome: Progressing   Problem: Nutrition: Goal: Adequate nutrition will be maintained Outcome: Progressing   Problem: Coping: Goal: Level of anxiety will decrease Outcome: Progressing   Problem: Elimination: Goal: Will not experience complications related to bowel motility Outcome: Progressing Goal: Will not experience complications related to urinary retention Outcome: Progressing   Problem: Pain Managment: Goal: General experience of comfort will improve Outcome: Progressing   Problem: Safety: Goal: Ability to remain free from injury will improve Outcome: Progressing   Problem: Skin Integrity: Goal: Risk for impaired skin integrity will decrease Outcome: Progressing

## 2020-11-08 NOTE — NC FL2 (Signed)
[place] LEVEL OF CARE SCREENING TOOL     IDENTIFICATION  Patient Name: Craig Wilson Birthdate: 19-Apr-1941 Sex: male Admission Date (Current Location): 11/05/2020  [place] and [place] Number:  Engineering geologist and Address:  [facility], [address], [place], [place] [address]      Provider Number: [id number]  Attending Physician Name and Address:  Mercy Riding, MD  Relative Name and Phone Number:       Current Level of Care: Hospital Recommended Level of Care: [place] Prior Approval Number:    Date Approved/Denied:   PASRR Number: Manual review  Discharge Plan: SNF    Current Diagnoses: Patient Active Problem List   Diagnosis Date Noted  . Acute respiratory failure with hypoxia and hypercapnia (HCC)   . Altered mental status   . Sepsis due to pneumonia ([place]) 11/05/2020  . Acute kidney injury ([place]) 10/31/2020  . Bladder outlet obstruction 10/31/2020  . Hyponatremia 10/31/2020  . Acute cystitis without hematuria 10/31/2020  . 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 OSA (obstructive sleep apnea) 11/03/2019  . RBBB (right bundle branch block) 11/03/2019  . 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 Other emphysema ([place]) 06/27/2017  . Acquired trigger finger 04/10/2017  . Elevated coronary artery calcium score 03/04/2017  . Sepsis ([place]) 07/01/2016  . CAP (community acquired pneumonia) 07/01/2016  . Influenza A 07/01/2016  . DDD (degenerative disc disease), lumbar 03/11/2016  . Senile purpura ([place]) 03/11/2016  . 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 Mass of left side of neck 10/19/2015  . Thrombocytopenia ([place]) 09/07/2015  . Bilateral edema of lower extremity 05/10/2015  . Bipolar disorder, in partial remission, most recent episode depressed ([place]) 05/10/2015  . 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 Edema of lower extremity 05/10/2015  . RLS (restless legs syndrome) 03/29/2015   . Adenomatous polyp 02/24/2015  . Anemia 02/24/2015  . Affective bipolar disorder ([place]) 02/24/2015  . Chronic diarrhea 02/24/2015  . Diabetic neuropathy ([place]) 02/24/2015  . Arthritis, degenerative 02/24/2015  . 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 BP (high blood pressure) 02/24/2015  . CA of prostate ([place]) 02/24/2015  . Incontinence 02/01/2015  . History of prostate cancer 02/01/2015  . Gross hematuria 02/01/2015  . SOB (shortness of breath) 12/16/2014  . Pain in the chest 12/16/2014  . Family history of premature CAD 12/16/2014  . Parkinson's disease ([place]) 12/16/2014  . Essential hypertension 12/16/2014  . Unsteady gait 12/16/2014  . Hyperlipidemia 12/16/2014  . 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 Difficulty in walking 10/10/2014  . Difficulty sleeping 10/10/2014  . Breath shortness 05/06/2014  . Amnesia 02/28/2014  . Type 2 diabetes mellitus without complication, without long-term current use of insulin ([place]) 02/15/2014  . Has a tremor 02/01/2006  . Tremor 02/01/2006    Orientation RESPIRATION BLADDER Height & Weight     Self,Time,Situation,Place  Normal Incontinent,External catheter Weight: 157 lb 13.6 oz (71.6 kg) Height:  _0  (172.7 cm)  BEHAVIORAL SYMPTOMS/MOOD NEUROLOGICAL BOWEL NUTRITION STATUS   (None)  (Parkinsons) Incontinent Diet (Full liquid)  AMBULATORY STATUS COMMUNICATION OF NEEDS Skin   Limited Assist Verbally Skin abrasions,Bruising                       Personal Care Assistance Level of Assistance  Bathing,Feeding,Dressing Bathing Assistance: Limited assistance Feeding assistance: Independent Dressing Assistance: Limited assistance     Functional Limitations Info  Sight,Hearing,Speech Sight Info: Adequate Hearing Info: Adequate Speech  Info: Adequate    SPECIAL CARE FACTORS FREQUENCY  PT (By licensed PT),OT (By licensed OT)     PT Frequency: 5 x week OT Frequency: 5 x week            Contractures Contractures Info: Not  present    Additional Factors Info  Code Status,Allergies,Psychotropic Code Status Info: Full code Allergies Info: Carbidopa W-levodopa, Latex, Solifenacin Psychotropic Info: Bipolar         Current Medications (11/08/2020):  This is the current hospital active medication list Current Facility-Administered Medications  Medication Dose Route Frequency Provider Last Rate Last Admin  . 0.9 %  sodium chloride infusion  250 mL Intravenous Continuous Flora Lipps, MD 10 mL/hr at 11/08/20 1401 250 mL at 11/08/20 1401  . Ampicillin-Sulbactam (UNASYN) 3 g in sodium chloride 0.9 % 100 mL IVPB  3 g Intravenous Q6H Kasa, Kurian, MD 200 mL/hr at 11/08/20 1101 3 g at 11/08/20 1101  . atorvastatin (LIPITOR) tablet 40 mg  40 mg Oral QPM Flora Lipps, MD   40 mg at 11/07/20 1828  . azithromycin (ZITHROMAX) tablet 500 mg  500 mg Oral Daily Benita Gutter, RPH   500 mg at 11/08/20 1102  . budesonide (PULMICORT) nebulizer solution 0.25 mg  0.25 mg Nebulization BID Lang Snow, NP   0.25 mg at 11/08/20 0749  . carbidopa-levodopa (SINEMET IR) 25-100 MG per tablet immediate release 1 tablet  1 tablet Oral QID Flora Lipps, MD   1 tablet at 11/08/20 1441  . chlorhexidine gluconate (MEDLINE KIT) (PERIDEX) 0.12 % solution 15 mL  15 mL Mouth Rinse BID Flora Lipps, MD   15 mL at 11/08/20 0800  . Chlorhexidine Gluconate Cloth 2 % PADS 6 each  6 each Topical T6226 Flora Lipps, MD   6 each at 11/07/20 0436  . docusate (COLACE) 50 MG/5ML liquid 100 mg  100 mg Per Tube BID PRN Flora Lipps, MD      . docusate sodium (COLACE) capsule 100 mg  100 mg Oral BID Lockie Mola B, RPH   100 mg at 11/08/20 1101  . fluticasone (FLONASE) 50 MCG/ACT nasal spray 2 spray  2 spray Each Nare Daily Wendee Beavers T, MD   2 spray at 11/08/20 1400  . heparin injection 5,000 Units  5,000 Units Subcutaneous Q8H Rust-Chester, Britton L, NP   5,000 Units at 11/08/20 1442  . insulin aspart (novoLOG) injection 0-15 Units  0-15 Units  Subcutaneous Q4H Lucrezia Starch, MD   3 Units at 11/08/20 1302  . ipratropium-albuterol (DUONEB) 0.5-2.5 (3) MG/3ML nebulizer solution 3 mL  3 mL Nebulization Q6H PRN Lang Snow, NP      . lithium carbonate capsule 300 mg  300 mg Oral BID Flora Lipps, MD   300 mg at 11/08/20 1101  . loratadine (CLARITIN) tablet 10 mg  10 mg Oral Daily Flora Lipps, MD   10 mg at 11/08/20 1101  . MEDLINE mouth rinse  15 mL Mouth Rinse q12n4p Flora Lipps, MD   15 mL at 11/08/20 1302  . pantoprazole (PROTONIX) EC tablet 40 mg  40 mg Oral Daily Benita Gutter, RPH   40 mg at 11/08/20 1101  . polyethylene glycol (MIRALAX / GLYCOLAX) packet 17 g  17 g Per Tube Daily PRN Flora Lipps, MD      . polyvinyl alcohol (LIQUIFILM TEARS) 1.4 % ophthalmic solution 1 drop  1 drop Both Eyes PRN Flora Lipps, MD   1 drop at 11/06/20 1736  .  rOPINIRole (REQUIP) tablet 2 mg  2 mg Oral QHS Flora Lipps, MD   2 mg at 11/07/20 2049  . sodium chloride (OCEAN) 0.65 % nasal spray 1 spray  1 spray Each Nare PRN Wendee Beavers T, MD      . traZODone (DESYREL) tablet 50-100 mg  50-100 mg Oral QHS Flora Lipps, MD   100 mg at 11/07/20 2049     Discharge Medications: Please see discharge summary for a list of discharge medications.  Relevant Imaging Results:  Relevant Lab Results:   Additional Information SS#: [id number]. Fully vaccinated. Pfizer: 07/29/19, 08/25/19, 02/29/20.  Candie Chroman, LCSW

## 2020-11-08 NOTE — Progress Notes (Deleted)
11/08/2020 7:51 AM   Craig Wilson May 17, 1941 [id number]  Referring provider: Adin Hector, MD [facility] [place],  [place] [address]  No chief complaint on file.   HPI:    PMH: Past Medical History:  Diagnosis Date  . Anemia   . Bipolar disorder ([place])   . Chronic diarrhea   . Colon polyps   . Depression   . Dermatophytosis   . Diabetic neuropathy ([place])   . DJD (degenerative joint disease)   . Dyspnea   . Gout   . Gross hematuria   . HLD (hyperlipidemia)   . HTN (hypertension)   . Hyperkeratosis   . Onychomycosis   . Parkinson disease ([place])   . Prostate cancer ([place])   . Sleep apnea   . Tremor   . Type 2 diabetes mellitus [facility])     Surgical History: Past Surgical History:  Procedure Laterality Date  . cataract surgery Bilateral   . COLONOSCOPY W/ BIOPSIES    . FOOT SURGERY    . PROSTATE SURGERY     removed  . status post implantation of artificial urinary sphincter     . TONSILLECTOMY      Home Medications:  Allergies as of 11/08/2020      Reactions   Carbidopa W-levodopa Other (See Comments)   irrational behavior , felt zombie like   Carbidopa-levodopa Other (See Comments)   irrational behavior , felt zombie like   Latex Other (See Comments)   Internal such as catheters. Latex gloves are okay   Solifenacin Other (See Comments)      Medication List    Notice   This visit is during an admission. Changes to the med list made in this visit will be reflected in the After Visit Summary of the admission.     Allergies:  Allergies  Allergen Reactions  . Carbidopa W-Levodopa Other (See Comments)    irrational behavior , felt zombie like  . Carbidopa-Levodopa Other (See Comments)    irrational behavior , felt zombie like  . Latex Other (See Comments)    Internal such as catheters. Latex gloves are okay  . Solifenacin Other (See Comments)    Family History: Family History  Problem Relation Age of Onset   . Heart disease Mother   . Heart attack Father 38  . Hypertension Father   . Hyperlipidemia Father   . Kidney cancer Neg Hx   . Prostate cancer Neg Hx   . Bladder Cancer Neg Hx     Social History:  reports that he quit smoking about 21 years ago. He has never used smokeless tobacco. He reports that he does not drink alcohol and does not use drugs.   Physical Exam: There were no vitals taken for this visit.  Constitutional:  Alert and oriented, No acute distress. HEENT: [place] AT, moist mucus membranes.  Trachea midline, no masses. Cardiovascular: No clubbing, cyanosis, or edema. Respiratory: Normal respiratory effort, no increased work of breathing. GI: Abdomen is soft, nontender, nondistended, no abdominal masses GU: No CVA tenderness Lymph: No cervical or inguinal lymphadenopathy. Skin: No rashes, bruises or suspicious lesions. Neurologic: Grossly intact, no focal deficits, moving all 4 extremities. Psychiatric: Normal mood and affect.  Laboratory Data: Lab Results  Component Value Date   WBC 11.9 (H) 11/08/2020   HGB 10.4 (L) 11/08/2020   HCT 32.5 (L) 11/08/2020   MCV 103.5 (H) 11/08/2020   PLT 154 11/08/2020    Lab Results  Component Value Date   CREATININE 1.03 11/08/2020    Lab Results  Component Value Date   PSA  09/20/2010    ========== TEST NAME ==========  ========= RESULTS =========  = REFERENCE RANGE =  PROSTATE-SPECIFIC AG.  PSA, Serum (Serial Monitor) Prostate Specific Ag, Serum     [   <0.1 ng/mL           ]           0.0-4.0 Roche ECLIA methodology.                                                                      . According to the American Urological Association, Serum PSA should decrease and remain at undetectable levels after radical prostatectomy. The AUA defines biochemical recurrence as an initial PSA value 0.2 ng/mL or greater followed by a subsequent confirmatory PSA value 0.2 ng/mL or greater. Values obtained with different assay  methods or kits cannot be used interchangeably. Results cannot be interpreted as absolute evidence of the presence or absence of malignant disease.               [facility]            No: [id number]           [address] [place], [place] [address]           Lindon Romp, MD         [phone]   Result(s) reported on 21 Sep 2010 at 12:50PM.     No results found for: TESTOSTERONE  Lab Results  Component Value Date   HGBA1C 5.6 10/31/2020    Urinalysis    Component Value Date/Time   COLORURINE YELLOW (A) 11/05/2020 1021   APPEARANCEUR CLEAR (A) 11/05/2020 1021   APPEARANCEUR Clear 03/11/2019 1354   LABSPEC 1.014 11/05/2020 1021   PHURINE 6.0 11/05/2020 1021   GLUCOSEU 50 (A) 11/05/2020 1021   HGBUR NEGATIVE 11/05/2020 1021   BILIRUBINUR NEGATIVE 11/05/2020 1021   BILIRUBINUR Negative 03/11/2019 1354   KETONESUR NEGATIVE 11/05/2020 1021   PROTEINUR 30 (A) 11/05/2020 1021   UROBILINOGEN 0.2 03/23/2010 1424   NITRITE NEGATIVE 11/05/2020 1021   LEUKOCYTESUR NEGATIVE 11/05/2020 1021    Lab Results  Component Value Date   LABMICR See below: 03/11/2019   WBCUA 0-5 03/11/2019   RBCUA 0-2 03/10/2018   LABEPIT 0-10 03/11/2019   BACTERIA NONE SEEN 11/05/2020    Pertinent Imaging: *** Results for orders placed during the hospital encounter of 11/05/20  DG Abd 1 View  Narrative CLINICAL DATA:  Nasogastric tube placement  EXAM: ABDOMEN - 1 VIEW  COMPARISON:  Same day CT abdomen pelvis  FINDINGS: Nasogastric tube with tip and side port in the stomach. The bowel gas pattern is normal. Surgical clips overlie the pelvis. Radiopaque artificial urinary sphincter reservoir and tubing visualized over the right pelvis.  IMPRESSION: Nasogastric tube with tip and side port in the stomach.   Electronically Signed By: Dahlia Bailiff MD On: 11/05/2020 14:46  No results found for this or any previous visit.  No results found for this or any previous  visit.  No results found for this or any previous visit.  Results for orders placed during the hospital encounter  of 10/31/20  US RENAL  Narrative CLINICAL DATA:  Renal failure  EXAM: RENAL / URINARY TRACT ULTRASOUND COMPLETE  COMPARISON:  07/03/2020  FINDINGS: Right Kidney:  Renal measurements: 10.8 x 4.9 by 6.6 cm = volume: 181.6 mL. There is increased renal cortical echotexture consistent with medical renal disease. Stable right renal cysts, measuring up to 5.1 x 5.8 x 5.4 cm in the upper pole. No hydronephrosis.  Left Kidney:  Renal measurements: 12.4 x 5.6 by 6.5 cm = volume: 238.4 mL. Grossly normal echogenicity. Stable 1 cm cyst lateral aspect left kidney. No hydronephrosis.  Bladder:  Bladder is significantly distended, volume measuring nearly 800 cc. No filling defects or irregularities.  Other:  None.  IMPRESSION: 1. Increased right renal cortical echotexture consistent with medical renal disease. 2. Bilateral renal cysts. 3. Distended urinary bladder without focal abnormality.   Electronically Signed By: Randa Ngo M.D. On: 10/31/2020 20:22  No results found for this or any previous visit.  Results for orders placed during the hospital encounter of 02/10/18  CT HEMATURIA WORKUP  Narrative CLINICAL DATA:  Gross hematuria time 1 3 months ago. History of prostate cancer with prostatectomy and artificial urinary sphincter placement.  EXAM: CT ABDOMEN AND PELVIS WITHOUT AND WITH CONTRAST  TECHNIQUE: Multidetector CT imaging of the abdomen and pelvis was performed following the standard protocol before and following the bolus administration of intravenous contrast.  CONTRAST:  123mL ISOVUE-300 IOPAMIDOL (ISOVUE-300) INJECTION 61%  COMPARISON:  07/28/2014  FINDINGS: Lower chest: Clear lung bases. Normal heart size without pericardial or pleural effusion.  Hepatobiliary: Bilateral hepatic cysts including a 1.4 cm inferior right  hepatic lobe lesion.  Mild motion degradation within the upper abdomen, most significant on the portal venous phase images. 1.0 cm gallstone. No acute cholecystitis. The intrahepatic ducts are upper normal, similar. No common duct dilatation.  Pancreas: Normal, without mass or ductal dilatation.  Spleen: Normal in size, without focal abnormality.  Adrenals/Urinary Tract: Normal adrenal glands. No renal calculi or hydronephrosis. No hydroureter or ureteric calculi. No bladder calculi.  Upper pole 7.2 cm right renal cyst. Bilateral too small to characterize renal lesions, most likely cysts. Moderate renal collecting system opacification on delayed images. Portions of the distal right ureter are incompletely opacified on delayed images. No ureteric or collecting system filling defects.  No enhancing bladder mass or bladder filling defect on delayed images.  Urethral sphincter device is unchanged in position.  Stomach/Bowel: Normal stomach, without wall thickening. Scattered colonic diverticula. Normal terminal ileum and appendix. Normal small bowel.  Vascular/Lymphatic: Aortic and branch vessel atherosclerosis. 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 No abdominopelvic adenopathy.  Reproductive: Prostatectomy.  No locally recurrent disease.  Other: No significant free fluid. Fat containing left inguinal hernia. Tiny periumbilical fat containing hernia.  Musculoskeletal: No acute osseous abnormality.  IMPRESSION: 1.  No acute process or explanation for hematuria. 2. Status post prostatectomy, without recurrent or metastatic disease. 3. 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 Aortic Atherosclerosis (ICD10-I70.0).   Electronically Signed By: Abigail Miyamoto M.D. On: 02/10/2018 10:04  No results found for this or any previous visit.   Assessment & Plan:    There are no diagnoses linked to this encounter.  No follow-ups on file.  Hollice Espy,  MD  [facility] [address] [place], [place] [address] [phone]

## 2020-11-08 NOTE — Progress Notes (Signed)
PROGRESS NOTE  FODAY CONE PFX:[id number] DOB: 1941-04-24   PCP: Adin Hector, MD  Patient is from: Home  DOA: 11/05/2020 LOS: 3  Chief complaints:  Chief Complaint  Patient presents with  . 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  He had emergent intubation for airway protection and profound ARF with hypoxia and hypercapnia.  Chest x-ray concerning for RUL pneumonia.  CTA chest negative for PE but showed bilateral pneumonia raising concern for aspiration with some debris's in the airway.  Patient was treated with antibiotics.  Extubated on 6/6 and transferred to [facility] service on 6/7.  Patient was evaluated by urology who already activated patient's artificial sphincter, and placed Foley catheter, which was removed on 3 days later on 6/7 per urology recommendation.  Subjective: Seen and examined earlier this morning.  No major events overnight of this morning.  Sitting on bedside chair and getting ready to work with therapy.  No complaints other than cough that has been getting better.  Denies shortness of breath.  Denies chest pain or GI symptoms. Has external catheter now.   Objective: Vitals:   11/08/20 0102 11/08/20 0458 11/08/20 0821 11/08/20 1200  BP:  (!) 146/48 (!) 156/60 (!) 152/62  Pulse:  74 78 82  Resp:  19 15 17   Temp:  (!) 97.5 F (36.4 C) 98.5 F (36.9 C) 98.7 F (37.1 C)  TempSrc:  Oral Oral Oral  SpO2:  97% 94% 93%  Weight: 71.6 kg     Height:        Intake/Output Summary (Last 24 hours) at 11/08/2020 1516 Last  data filed at 11/08/2020 1418 Gross per 24 hour  Intake 2147.04 ml  Output 2951 ml  Net -803.96 ml   Filed Weights   11/06/20 0407 11/07/20 0459 11/08/20 0102  Weight: 72.1 kg 69.8 kg 71.6 kg    Examination:  GENERAL: No apparent distress.  Nontoxic. HEENT: MMM.  Vision and hearing grossly intact.  NECK: Supple.  No apparent JVD.  RESP: On RA.  No IWOB.  Rhonchi bilaterally. CVS:  RRR. Heart sounds normal.  ABD/GI/GU: BS+. Abd soft, NTND.  MSK/EXT:  Moves extremities.  Some degree of muscle mass and subcu fat loss. SKIN: no apparent skin lesion or wound NEURO: Awake, alert and oriented appropriately.  No apparent focal neuro deficit. PSYCH: Calm. Normal affect.  Procedures:  Intubation and mechanical ventilation from 6/5-6/6  Microbiology summarized: COVID-19 and influenza PCR nonreactive Blood cultures NGTD Urine cultures NGTD   Assessment & Plan: Severe sepsis secondary to aspiration pneumonia: POA.  Acute respiratory failure with hypoxia and hypercapnia due to the above: Resolved. Acute metabolic encephalopathy  -CTA negative for PE but bilateral opacities and debris's in airway raising concern for aspiration -Intubation and mechanical ventilation from 6/5-6/6 -IV Unasyn and azithromycin>> changed to p.o. Augmentin and azithromycin -Aspiration precaution   Fall at home: No focal neurodeficit.  CT head without acute finding. History of Parkinson's disease -PT/OT eval -Fall precaution -Continue home Sinemet  AKI/azotemia: Likely due to sepsis.  There is also concern about urinary obstruction from artificial sphincter  Recent Labs    10/31/20 1412 10/31/20 1850 10/31/20 2241 11/01/20 0201 11/01/20 0525 11/02/20 0405 11/05/20 0754 11/06/20 0421 11/07/20 0417 11/08/20 0524  BUN 87* 79* 73* 73* 68* 45* 27* 24* 25* 22  CREATININE 5.45* 4.69* 3.74* 3.27* 2.76* 1.30* 1.32* 0.97 1.05 1.03  -Continue monitoring. -Closely monitor for urine output  History of  urinary incontinence with artificial sphincter -Artificial sphincter deactivated. -Needs outpatient follow-up with the urologist at [facility] for further management -Patient is okay to wear depends until he sees his urologist  Normocytic anemia: Likely anemia of chronic disease.  Anemia panel consistent with this.  H&H stable. Recent Labs    04/22/20 0630 10/31/20 1412 11/01/20 0201 11/02/20 0405 11/05/20 0753 11/06/20 0421 11/07/20 0417 11/08/20 0524  HGB 11.4* 10.8* 10.5* 9.7* 11.1* 9.6* 8.9* 10.4*  -Monitor intermittently  Essential hypertension: Normotensive off home losartan. -Continue to monitor  Hyperlipidemia. -Continue statin  DM-2?  A1c 5.6% on 5/31.  Does not seem to be on medication at home. Recent Labs  Lab 11/07/20 2003 11/08/20 0022 11/08/20 0459 11/08/20 0821 11/08/20 1204  GLUCAP 188* 105* 105* 110* 164*  -Discontinue SSI and CBG monitoring  Elevated troponin: Likely demand ischemia from sepsis.  Body mass index is 24 kg/m.         DVT prophylaxis:  heparin injection 5,000 Units Start: 11/05/20 2215  Code Status: Full code Family Communication: Patient and/or RN. Available if any question.  Level of care: Med-Surg Status is: Inpatient  Remains inpatient appropriate because:IV treatments appropriate due to intensity of illness or inability to take PO and Inpatient level of care appropriate due to severity of illness   Dispo: The patient is from: Home              Anticipated d/c is to: SNF              Patient currently is medically stable to d/c.   Difficult to place patient No       Consultants:  Intensivist Urology   Sch Meds:  Scheduled Meds: . atorvastatin  40 mg Oral QPM  . azithromycin  500 mg Oral Daily  . budesonide (PULMICORT) nebulizer solution  0.25 mg Nebulization BID  . carbidopa-levodopa  1 tablet Oral QID  . chlorhexidine gluconate (MEDLINE KIT)  15 mL Mouth Rinse BID  . Chlorhexidine Gluconate Cloth  6 each  Topical Q0600  . docusate sodium  100 mg Oral BID  . fluticasone  2 spray Each Nare Daily  . heparin injection (subcutaneous)  5,000 Units Subcutaneous Q8H  . insulin aspart  0-15 Units Subcutaneous Q4H  . lithium carbonate  300 mg Oral BID  . loratadine  10 mg Oral Daily  . mouth rinse  15 mL Mouth Rinse q12n4p  . pantoprazole  40 mg Oral Daily  . rOPINIRole  2 mg Oral QHS  . traZODone  50-100 mg Oral QHS   Continuous Infusions: . sodium chloride 250 mL (11/08/20 1401)  . ampicillin-sulbactam (UNASYN) IV 3 g (11/08/20 1101)   PRN Meds:.docusate, ipratropium-albuterol, polyethylene glycol, polyvinyl alcohol, sodium chloride  Antimicrobials: Anti-infectives (From admission, onward)   Start     Dose/Rate Route Frequency Ordered Stop   11/07/20 1000  azithromycin (ZITHROMAX) tablet 500 mg        500 mg Oral Daily 11/06/20 1502     11/06/20 2200  Ampicillin-Sulbactam (UNASYN) 3 g in sodium chloride 0.9 % 100 mL IVPB        3 g 200 mL/hr  over 30 Minutes Intravenous Every 6 hours 11/06/20 1450     11/06/20 1100  vancomycin (VANCOREADY) IVPB 1250 mg/250 mL  Status:  Discontinued        1,250 mg 166.7 mL/hr over 90 Minutes Intravenous Every 24 hours 11/06/20 0912 11/06/20 1028   11/06/20 0900  vancomycin (VANCOCIN) IVPB 1000 mg/200 mL premix  Status:  Discontinued        1,000 mg 200 mL/hr over 60 Minutes Intravenous Every 24 hours 11/05/20 1223 11/05/20 1537   11/06/20 0900  vancomycin (VANCOREADY) IVPB 1000 mg/200 mL  Status:  Discontinued        1,000 mg 200 mL/hr over 60 Minutes Intravenous Every 24 hours 11/05/20 1537 11/06/20 0912   11/05/20 2000  ceFEPIme (MAXIPIME) 2 g in sodium chloride 0.9 % 100 mL IVPB  Status:  Discontinued        2 g 200 mL/hr over 30 Minutes Intravenous Every 12 hours 11/05/20 1223 11/06/20 1450   11/05/20 1200  vancomycin (VANCOCIN) IVPB 1000 mg/200 mL premix  Status:  Discontinued        1,000 mg 200 mL/hr over 60 Minutes Intravenous  Once 11/05/20  1152 11/05/20 1205   11/05/20 1200  ceFEPIme (MAXIPIME) 2 g in sodium chloride 0.9 % 100 mL IVPB  Status:  Discontinued        2 g 200 mL/hr over 30 Minutes Intravenous  Once 11/05/20 1152 11/05/20 1206   11/05/20 1200  azithromycin (ZITHROMAX) 500 mg in sodium chloride 0.9 % 250 mL IVPB  Status:  Discontinued        500 mg 250 mL/hr over 60 Minutes Intravenous Every 24 hours 11/05/20 1152 11/06/20 1502   11/05/20 0845  vancomycin (VANCOREADY) IVPB 1500 mg/300 mL        1,500 mg 150 mL/hr over 120 Minutes Intravenous  Once 11/05/20 0835 11/05/20 1134   11/05/20 0800  ceFEPIme (MAXIPIME) 2 g in sodium chloride 0.9 % 100 mL IVPB        2 g 200 mL/hr over 30 Minutes Intravenous  Once 11/05/20 0752 11/05/20 0844       I have personally reviewed the following labs and images: CBC: Recent Labs  Lab 11/02/20 0405 11/05/20 0753 11/06/20 0421 11/07/20 0417 11/08/20 0524  WBC 6.8 30.9* 20.9* 16.6* 11.9*  NEUTROABS  --  28.1* 20.0*  --   --   HGB 9.7* 11.1* 9.6* 8.9* 10.4*  HCT 30.4* 34.9* 29.4* 28.4* 32.5*  MCV 101.0* 103.6* 102.1* 104.4* 103.5*  PLT 153 278 184 160 154   BMP &GFR Recent Labs  Lab 11/02/20 0405 11/05/20 0754 11/06/20 0421 11/07/20 0417 11/08/20 0524  NA 139 137 137 137 142  K 4.3 4.2 4.6 4.3 4.3  CL 108 103 107 110 109  CO2 25 23 22  20* 25  GLUCOSE 147* 334* 167* 131* 114*  BUN 45* 27* 24* 25* 22  CREATININE 1.30* 1.32* 0.97 1.05 1.03  CALCIUM 9.4 9.9 9.7 9.5 9.7  MG  --  2.1 1.9 2.0  --   PHOS  --   --  4.2 3.1  --    Estimated Creatinine Clearance: 53.5 mL/min (by C-G formula based on SCr of 1.03 mg/dL). Liver & Pancreas: Recent Labs  Lab 11/05/20 0754  AST 16  ALT 12  ALKPHOS 81  BILITOT 0.6  PROT 6.6  ALBUMIN 3.3*   No results for input(s): LIPASE, AMYLASE in the last 168 hours. No results for input(s): AMMONIA in the last 168  hours. Diabetic: No results for input(s): HGBA1C in the last 72 hours. Recent Labs  Lab 11/07/20 2003  11/08/20 0022 11/08/20 0459 11/08/20 0821 11/08/20 1204  GLUCAP 188* 105* 105* 110* 164*   Cardiac Enzymes: Recent Labs  Lab 11/05/20 0754  CKTOTAL 260   No results for input(s): PROBNP in the last 8760 hours. Coagulation Profile: Recent Labs  Lab 11/05/20 0754  INR 1.2   Thyroid Function Tests: No results for input(s): TSH, T4TOTAL, FREET4, T3FREE, THYROIDAB in the last 72 hours. Lipid Profile: Recent Labs    11/06/20 0421  TRIG 104   Anemia Panel: Recent Labs    11/07/20 0417 11/07/20 1529  VITAMINB12  --  727  FOLATE 9.6  --   FERRITIN 227  --   TIBC 176*  --   IRON 58  --   RETICCTPCT 2.5  --    Urine analysis:    Component Value Date/Time   COLORURINE YELLOW (A) 11/05/2020 1021   APPEARANCEUR CLEAR (A) 11/05/2020 1021   APPEARANCEUR Clear 03/11/2019 1354   LABSPEC 1.014 11/05/2020 1021   PHURINE 6.0 11/05/2020 1021   GLUCOSEU 50 (A) 11/05/2020 1021   HGBUR NEGATIVE 11/05/2020 1021   BILIRUBINUR NEGATIVE 11/05/2020 1021   BILIRUBINUR Negative 03/11/2019 1354   KETONESUR NEGATIVE 11/05/2020 1021   PROTEINUR 30 (A) 11/05/2020 1021   UROBILINOGEN 0.2 03/23/2010 1424   NITRITE NEGATIVE 11/05/2020 1021   LEUKOCYTESUR NEGATIVE 11/05/2020 1021   Sepsis Labs: Invalid input(s): PROCALCITONIN, [place]  Microbiology: Recent Results (from the past 240 hour(s))  Urine culture     Status: Abnormal   Collection Time: 10/31/20  3:02 PM   Specimen: Urine, Random  Result Value Ref Range Status   Specimen Description   Final    URINE, RANDOM Performed at [facility], [address] [place], [place] [address]    Special Requests   Final    NONE Performed at [facility], [address] [place], [place] [address]    Culture MULTIPLE SPECIES PRESENT, SUGGEST RECOLLECTION (A)  Final   Report Status 11/02/2020 FINAL  Final  SARS CORONAVIRUS 2 (TAT 6-24 HRS) Nasopharyngeal Nasopharyngeal Swab     Status: None   Collection Time:  10/31/20  3:24 PM   Specimen: Nasopharyngeal Swab  Result Value Ref Range Status   SARS Coronavirus 2 NEGATIVE NEGATIVE Final    Comment: (NOTE) SARS-CoV-2 target nucleic acids are NOT DETECTED.  The SARS-CoV-2 RNA is generally detectable in upper and lower respiratory specimens during the acute phase of infection. Negative results do not preclude SARS-CoV-2 infection, do not rule out co-infections with other pathogens, and should not be used as the sole basis for treatment or other patient management decisions. Negative results must be combined with clinical observations, patient history, and epidemiological information. The expected result is Negative.  Fact Sheet for Patients: SugarRoll.be  Fact Sheet for Healthcare Providers: https://www.woods-mathews.com/  This test is not yet approved or cleared by the [place] FDA and  has been authorized for detection and/or diagnosis of SARS-CoV-2 by FDA under an Emergency Use Authorization (EUA). This EUA will remain  in effect (meaning this test can be used) for the duration of the COVID-19 declaration under Se ction 564(b)(1) of the Act, 21 U.S.C. section 360bbb-3(b)(1), unless the authorization is terminated or revoked sooner.  Performed at [facility], [place] [address], [place], [place] [address]   Blood culture (routine single)     Status: None (Preliminary result)  Collection Time: 11/05/20  7:53 AM   Specimen: BLOOD  Result Value Ref Range Status   Specimen Description BLOOD LEFT ANTECUBITAL  Final   Special Requests   Final    BOTTLES DRAWN AEROBIC AND ANAEROBIC Blood Culture adequate volume   Culture   Final    NO GROWTH 3 DAYS Performed at [facility], [address] [place], [place] [address]    Report Status PENDING  Incomplete  Resp Panel by RT-PCR (Flu A&B, Covid) Nasopharyngeal Swab     Status: None   Collection Time: 11/05/20  7:54 AM   Specimen:  Nasopharyngeal Swab; Nasopharyngeal(NP) swabs in vial transport medium  Result Value Ref Range Status   SARS Coronavirus 2 by RT PCR NEGATIVE NEGATIVE Final    Comment: (NOTE) SARS-CoV-2 target nucleic acids are NOT DETECTED.  The SARS-CoV-2 RNA is generally detectable in upper respiratory specimens during the acute phase of infection. The lowest concentration of SARS-CoV-2 viral copies this assay can detect is 138 copies/mL. A negative result does not preclude SARS-Cov-2 infection and should not be used as the sole basis for treatment or other patient management decisions. A negative result may occur with  improper specimen collection/handling, submission of specimen other than nasopharyngeal swab, presence of viral mutation(s) within the areas targeted by this assay, and inadequate number of viral copies(<138 copies/mL). A negative result must be combined with clinical observations, patient history, and epidemiological information. The expected result is Negative.  Fact Sheet for Patients:  EntrepreneurPulse.com.au  Fact Sheet for Healthcare Providers:  IncredibleEmployment.be  This test is no t yet approved or cleared by the [place] FDA and  has been authorized for detection and/or diagnosis of SARS-CoV-2 by FDA under an Emergency Use Authorization (EUA). This EUA will remain  in effect (meaning this test can be used) for the duration of the COVID-19 declaration under Section 564(b)(1) of the Act, 21 U.S.C.section 360bbb-3(b)(1), unless the authorization is terminated  or revoked sooner.       Influenza A by PCR NEGATIVE NEGATIVE Final   Influenza B by PCR NEGATIVE NEGATIVE Final    Comment: (NOTE) The Xpert Xpress SARS-CoV-2/FLU/RSV plus assay is intended as an aid in the diagnosis of influenza from Nasopharyngeal swab specimens and should not be used as a sole basis for treatment. Nasal washings and aspirates are unacceptable for  Xpert Xpress SARS-CoV-2/FLU/RSV testing.  Fact Sheet for Patients: EntrepreneurPulse.com.au  Fact Sheet for Healthcare Providers: IncredibleEmployment.be  This test is not yet approved or cleared by the [place] FDA and has been authorized for detection and/or diagnosis of SARS-CoV-2 by FDA under an Emergency Use Authorization (EUA). This EUA will remain in effect (meaning this test can be used) for the duration of the COVID-19 declaration under Section 564(b)(1) of the Act, 21 U.S.C. section 360bbb-3(b)(1), unless the authorization is terminated or revoked.  Performed at [facility], [address], [place], [place] [address]   Urine culture     Status: None   Collection Time: 11/05/20 10:21 AM   Specimen: In/Out Cath Urine  Result Value Ref Range Status   Specimen Description   Final    IN/OUT CATH URINE Performed at [facility], [address] [place], [place] [address]    Special Requests   Final    NONE Performed at [facility], [address], [place], [place] [address]    Culture   Final    NO GROWTH Performed at [facility], [place]  [address] [place], [place] [address]    Report Status 11/06/2020 FINAL  Final  MRSA PCR Screening     Status: None   Collection Time: 11/05/20  1:54 PM   Specimen: Nasopharyngeal  Result Value Ref Range Status   MRSA by PCR NEGATIVE NEGATIVE Final    Comment:        The GeneXpert MRSA Assay (FDA approved for NASAL specimens only), is one component of a comprehensive MRSA colonization surveillance program. It is not intended to diagnose MRSA infection nor to guide or monitor treatment for MRSA infections. Performed at [facility], [address], [place], [place] [address]     Radiology Studies: No results found.    Viola Placeres T. [place]  If 7PM-7AM, please contact night-coverage [email or web address] 11/08/2020, 3:16 PM

## 2020-11-08 NOTE — TOC Initial Note (Signed)
Transition of Care [facility]) - Initial/Assessment Note    Patient Details  Name: Craig Wilson MRN: [id number] Date of Birth: 12/01/1940  Transition of [facility]) CM/SW Contact:    Shelbie Hutching, RN Phone Number: 11/08/2020, 4:45 PM  Clinical Narrative:                 Patient admitted to the hospital with sepsis.  RNCM met with patient at the bedside.  PT has recommended SNF.  Patient does not want to go but agrees if it will get him better.  Patient prefers [facility]. Bed search started, [facility] pending.  Patient is from home with his wife and at baseline is independent and drives.  Patient is current with PCP and uses [facility] for prescriptions. Patient is fully vaccinated.  Expected Discharge Plan: Skilled Nursing Facility Barriers to Discharge: Continued Medical Work up   Patient Goals and CMS Choice Patient states their goals for this hospitalization and ongoing recovery are:: does not want to go to SNF but will go if it will help him get home CMS Medicare.gov Compare Post Acute Care list provided to:: Patient Choice offered to / list presented to : Patient  Expected Discharge Plan and Services Expected Discharge Plan: [place]   Discharge Planning Services: CM Consult Post Acute Care Choice: [place] arrangements for the past 2 months: Single Family Home                 DME Arranged: N/A DME Agency: NA       HH Arranged: NA          Prior Living Arrangements/Services Living arrangements for the past 2 months: Single Family Home Lives with:: Spouse Patient language and need for interpreter reviewed:: Yes Do you feel safe going back to the place where you live?: Yes      Need for Family Participation in Patient Care: Yes (Comment) (sepsis) Care giver support system in place?: Yes (comment) (wife)   Criminal Activity/Legal Involvement Pertinent to Current Situation/Hospitalization: No - Comment as needed  Activities  of Daily Living      Permission Sought/Granted Permission sought to share information with : Case Manager,Family Chief Financial Officer Permission granted to share information with : Yes, Verbal Permission Granted  Share Information with NAME: [name]  Permission granted to share info w AGENCY: Dietitian granted to share info w Relationship: wife     Emotional Assessment Appearance:: Appears stated age Attitude/Demeanor/Rapport: Engaged Affect (typically observed): Accepting Orientation: : Oriented to Self,Oriented to Place,Oriented to  Time,Oriented to Situation Alcohol / Substance Use: Not Applicable Psych Involvement: No (comment)  Admission diagnosis:  Urinary retention [R33.9] Fall [W19.XXXA] NSTEMI (non-ST elevated myocardial infarction) ([place]) [I21.4] Acute respiratory failure with hypoxia and hypercapnia (HCC) [J96.01, J96.02] Altered mental status, unspecified altered mental status type [R41.82] Sepsis due to pneumonia ([place]) [J18.9, A41.9] Sepsis, due to unspecified organism, unspecified whether acute organ dysfunction present [facility]) [A41.9] Patient Active Problem List   Diagnosis Date Noted  . Acute respiratory failure with hypoxia and hypercapnia (HCC)   . Altered mental status   . Sepsis due to pneumonia ([place]) 11/05/2020  . Acute kidney injury ([place]) 10/31/2020  . Bladder outlet obstruction 10/31/2020  . Hyponatremia 10/31/2020  . Acute cystitis without hematuria 10/31/2020  . 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 OSA (obstructive sleep apnea) 11/03/2019  . RBBB (right bundle branch block) 11/03/2019  . 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 Other emphysema ([place]) 06/27/2017  .  Acquired trigger finger 04/10/2017  . Elevated coronary artery calcium score 03/04/2017  . Sepsis ([place]) 07/01/2016  . CAP (community acquired pneumonia) 07/01/2016  . Influenza A 07/01/2016  . DDD (degenerative disc disease), lumbar 03/11/2016  .  Senile purpura ([place]) 03/11/2016  . 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 Mass of left side of neck 10/19/2015  . Thrombocytopenia ([place]) 09/07/2015  . Bilateral edema of lower extremity 05/10/2015  . Bipolar disorder, in partial remission, most recent episode depressed ([place]) 05/10/2015  . 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 Edema of lower extremity 05/10/2015  . RLS (restless legs syndrome) 03/29/2015  . Adenomatous polyp 02/24/2015  . Anemia 02/24/2015  . Affective bipolar disorder ([place]) 02/24/2015  . Chronic diarrhea 02/24/2015  . Diabetic neuropathy ([place]) 02/24/2015  . Arthritis, degenerative 02/24/2015  . 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 BP (high blood pressure) 02/24/2015  . CA of prostate ([place]) 02/24/2015  . Incontinence 02/01/2015  . History of prostate cancer 02/01/2015  . Gross hematuria 02/01/2015  . SOB (shortness of breath) 12/16/2014  . Pain in the chest 12/16/2014  . Family history of premature CAD 12/16/2014  . Parkinson's disease ([place]) 12/16/2014  . Essential hypertension 12/16/2014  . Unsteady gait 12/16/2014  . Hyperlipidemia 12/16/2014  . 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 Difficulty in walking 10/10/2014  . Difficulty sleeping 10/10/2014  . Breath shortness 05/06/2014  . Amnesia 02/28/2014  . Type 2 diabetes mellitus without complication, without long-term current use of insulin ([place]) 02/15/2014  . Has a tremor 02/01/2006  . Tremor 02/01/2006   PCP:  Adin Hector, MD Pharmacy:   [facility] [phone] [address], [place] [address] Phone: [phone] Fax: [phone]  [facility] [place], [place] [address] [place] [address] Phone: [phone] Fax: [phone]  [facility]  ([place], [place], [address] [place], [place] [address] Phone: [phone] Fax: [phone]     Social Determinants of Health (SDOH) Interventions    Readmission Risk Interventions No flowsheet data found.

## 2020-11-08 NOTE — Evaluation (Signed)
Occupational Therapy Evaluation Patient Details Name: Craig Wilson MRN: [id number] DOB: [birth year] Today's Date: 11/08/2020    History of Present Illness Craig Wilson is a 80 year old male with history of prostate cancer status post radical prostatectomy and radiation presents to [facility] after being found down unresponsive by his wife. Was emergently intubated on admission. Etiology of unresponsiveness felt to be due to hypoxic and hypercarbic respiratory failure in the setting of multifocal pneumonia with severe aspiration pneumonitis.   Clinical Impression   [name] presents today with generalized weakness, reduced endurance, and impaired balance. He was highly motivated to participate in therapy, denied pain, was A&Ox4, pleasant and talkative. Participated in multiple sit<>stand with CGA and RW. After initial instruction for safe technique when using RW, pt was able to perform task without additional VCs. Engaged in grooming, dressing, feeding with IND/SUPV from seated position, required CGA for toilet transfer and clothing management/hygeine, using BSC. Pt has constant urinary incontinence, therapist worked with nurse and nurse tech to find catheter type that would help pt manage urine and stay dry. Worked on standing balance with RW while engaged in functional mobility/standing therex. HR increased from 100 in static standing to 120 with mild physical activity in standing. Trialed standing balance w/o UE support, pt quickly felt unsteady on his feet in this situation. Pt reports 4 falls (2 indoors, 2 outdoors) in previous 6 months. Provided education on exercises for improved balance and recommended HEP for balance. Pt will benefit from ongoing OT while hospitalized. At this point would recommend DC to SNF for STR, given pt's weakness, impaired balance, and limited endurance. If [place] to SNF, pt prefers [facility], which is near his home. [name] does express his strong  interest, however, in returning home rather than going to rehab. Will follow closely over the next few days, to evaluate his improvement while hospitalized, to determine if DC with HHOT would be a safe option.     Follow Up Recommendations  SNF;Home health OT    Equipment Recommendations       Recommendations for Other Services       Precautions / Restrictions Precautions Precautions: Fall Restrictions Weight Bearing Restrictions: No      Mobility Bed Mobility               General bed mobility comments: pt received/left in recliner    Transfers Overall transfer level: Needs assistance Equipment used: Rolling walker (2 wheeled) Transfers: Sit to/from [employer] Transfers Sit to Stand: Min guard Stand pivot transfers: Min guard       General transfer comment: provided initial instruction re: safe technique/hand positioning with RW. Pt able to complete subsequent sit<>stand and t/fs safely w/o cueing    Balance Overall balance assessment: Needs assistance Sitting-balance support: No upper extremity supported;Feet supported Sitting balance-Leahy Scale: Good     Standing balance support: Bilateral upper extremity supported;Single extremity supported;During functional activity Standing balance-Leahy Scale: Fair                             ADL either performed or assessed with clinical judgement   ADL Overall ADL's : Needs assistance/impaired Eating/Feeding: Independent   Grooming: Wash/dry hands;Wash/dry face;Brushing hair;Independent               Lower Body Dressing: Supervision/safety;Sitting/lateral leans   Toilet Transfer: Min guard;BSC;Stand-pivot;RW   Toileting- Clothing Manipulation and Hygiene: Supervision/safety  Vision Baseline Vision/History: Wears glasses Wears Glasses: At all times Patient Visual Report: No change from baseline       Perception     Praxis      Pertinent Vitals/Pain Pain  Assessment: No/denies pain     Hand Dominance     Extremity/Trunk Assessment Upper Extremity Assessment Upper Extremity Assessment: Overall WFL for tasks assessed   Lower Extremity Assessment Lower Extremity Assessment: Overall WFL for tasks assessed       Communication Communication Communication: No difficulties   Cognition Arousal/Alertness: Awake/alert Behavior During Therapy: WFL for tasks assessed/performed Overall Cognitive Status: Within Functional Limits for tasks assessed                                 General Comments: A&O x 4, pleasant, talkative   General Comments  bruising, one small laceration, b/l UE    Exercises Total Joint Exercises Ankle Circles/Pumps: AROM;10 reps;Seated;Both Quad Sets: AROM;10 reps;Seated Other Exercises Other Exercises: transfers, sit<>stand, balance tolerance, grooming, dressing, toileting, therex   Shoulder Instructions      Home Living Family/patient expects to be discharged to:: Private residence Living Arrangements: Spouse/significant other Available Help at Discharge: Family;Friend(s) Type of Home: House Home Access: Stairs to enter [employer] of Steps: 1 small step   Home Layout: One level     Bathroom Shower/Tub: Occupational psychologist: Handicapped height     Home Equipment: [place] - single point;Grab bars - toilet;Grab bars - tub/shower (rollator)          Prior Functioning/Environment Level of Independence: Independent                 OT Problem List: Decreased strength;Decreased activity tolerance;Impaired balance (sitting and/or standing);Decreased knowledge of use of DME or AE      OT Treatment/Interventions: Self-care/ADL training;Therapeutic exercise;Patient/family education;Balance training;Therapeutic activities;DME and/or AE instruction    OT Goals(Current goals can be found in the care plan section) Acute Rehab OT Goals Patient Stated Goal: to get  home OT Goal Formulation: With patient Time For Goal Achievement: 11/22/20 Potential to Achieve Goals: Good ADL Goals Pt Will Perform Lower Body Dressing: with supervision;sit to/from stand Pt Will Transfer to Toilet: with supervision;stand pivot transfer;ambulating Pt/caregiver will Perform Home Exercise Program: Increased strength;Increased ROM;With Supervision (to improve balance and strength)  OT Frequency: Min 2X/week   Barriers to D/C:            Co-evaluation              AM-PAC OT "6 Clicks" Daily Activity     Outcome Measure Help from another person eating meals?: None Help from another person taking care of personal grooming?: None Help from another person toileting, which includes using toliet, bedpan, or urinal?: A Lot Help from another person bathing (including washing, rinsing, drying)?: A Little Help from another person to put on and taking off regular upper body clothing?: None Help from another person to put on and taking off regular lower body clothing?: A Little 6 Click Score: 20   End of Session Equipment Utilized During Treatment: Rolling walker Nurse Communication: Other (comment) (leaking purewick device)  Activity Tolerance: Patient tolerated treatment well Patient left: in chair;with call bell/phone within reach;with chair alarm set;with nursing/sitter in room  OT Visit Diagnosis: Unsteadiness on feet (R26.81);History of falling (Z91.81);Muscle weakness (generalized) (M62.81)  Time: 9802-2179 OT Time Calculation (min): 89 min Charges:  OT General Charges $OT Visit: 1 Visit OT Evaluation $OT Eval High Complexity: 1 High OT Treatments $Self Care/Home Management : 53-67 mins $Therapeutic Activity: 8-22 mins $Therapeutic Exercise: 8-22 mins  Josiah Lobo, PhD, MS, OTR/L 11/08/20, 11:10 AM

## 2020-11-09 LAB — GLUCOSE, CAPILLARY
Glucose-Capillary: 102 mg/dL — ABNORMAL HIGH (ref 70–99)
Glucose-Capillary: 118 mg/dL — ABNORMAL HIGH (ref 70–99)
Glucose-Capillary: 144 mg/dL — ABNORMAL HIGH (ref 70–99)
Glucose-Capillary: 264 mg/dL — ABNORMAL HIGH (ref 70–99)

## 2020-11-09 LAB — RESP PANEL BY RT-PCR (FLU A&B, COVID) ARPGX2
Influenza A by PCR: NEGATIVE
Influenza B by PCR: NEGATIVE
SARS Coronavirus 2 by RT PCR: NEGATIVE

## 2020-11-09 MED ORDER — AMOXICILLIN-POT CLAVULANATE 875-125 MG PO TABS: 1.0000 | ORAL_TABLET | Freq: Two times a day (BID) | ORAL | 0 refills | Status: AC

## 2020-11-09 MED ORDER — HYDROCOD POLST-CPM POLST ER 10-8 MG/5ML PO SUER: 5.0000 mL | Freq: Two times a day (BID) | ORAL | 0 refills | Status: DC

## 2020-11-09 MED ORDER — COLCHICINE 0.6 MG PO TABS
1.2000 mg | ORAL_TABLET | Freq: Once | ORAL | Status: AC
  Administered 2020-11-09: 1.2 mg via ORAL
  Filled 2020-11-09: qty 2

## 2020-11-09 MED ORDER — COLCHICINE 0.6 MG PO TABS
0.6000 mg | ORAL_TABLET | Freq: Once | ORAL | Status: DC
  Filled 2020-11-09: qty 1

## 2020-11-09 MED ORDER — DOCUSATE SODIUM 100 MG PO CAPS: 100.0000 mg | ORAL_CAPSULE | Freq: Two times a day (BID) | ORAL | 0 refills | Status: DC

## 2020-11-09 MED ORDER — POLYETHYLENE GLYCOL 3350 17 G PO PACK
17.0000 g | PACK | Freq: Every day | ORAL | 0 refills | Status: DC | PRN
Start: 2020-11-09 — End: 2021-12-08

## 2020-11-09 NOTE — Progress Notes (Signed)
Occupational Therapy Treatment Patient Details Name: Craig Wilson MRN: [id number] DOB: 08-26-1940 Today's Date: 11/09/2020    History of present illness Craig Wilson is a 80 year old male with history of prostate cancer status post radical prostatectomy and radiation presents to [facility] after being found down unresponsive by his wife. Was emergently intubated on admission. Etiology of unresponsiveness felt to be due to hypoxic and hypercarbic respiratory failure in the setting of multifocal pneumonia with severe aspiration pneumonitis.   OT comments  Had a long, productive session with [name] today, which included consultation with nurse, aide, case manager, and MD. [name] reports that he feels stronger than yesterday, but is still unsteady on his feet. For the most part he did not require physical assistance for performing functional mobility tasks, but did need close supervision for safety, given his weakness, impaired balance, and heavy reliance on UE support for transfers and ambulation. Besides balance and endurance issues, [name] urinary incontinence greatly impairs his ability to engage in ADLs and functional mobility. With his continual urine flow and external catheters proving inadequate to catch the urine, the pt's bottom, LEs, gown, socks, bedding, chair, etc., are constantly wet. Pt was able to change his clothing with extended time and supv for safety, but his garments were quickly wet again. Need to find a type of external catheter or other system for catching urine that will allow the pt to not be always sitting in urine. Mr. [name] expresses fear of falling and concerns about his stability and endurance. Therefore, recommend STR to allow him to build strength and improve balance, prior to returning home with supports to return to PLOF.    Follow Up Recommendations  SNF    Equipment Recommendations  Other (comment);3 in 1 bedside commode (RW)     Recommendations for Other Services Other (comment) (urology consult)    Precautions / Restrictions Precautions Precautions: Fall Restrictions Weight Bearing Restrictions: No       Mobility Bed Mobility Overal bed mobility: Needs Assistance                  Transfers Overall transfer level: Needs assistance Equipment used: Rolling walker (2 wheeled) Transfers: Sit to/from Stand Sit to Stand: Min guard Stand pivot transfers: Min guard       General transfer comment: able to come into standing w/ RW w/ CGA; however, required heavy use of bed rails to come into standing    Balance Overall balance assessment: Needs assistance Sitting-balance support: Bilateral upper extremity supported Sitting balance-Leahy Scale: Good Sitting balance - Comments: Able to maintain upright posture sitting EOB   Standing balance support: Bilateral upper extremity supported;Single extremity supported;During functional activity Standing balance-Leahy Scale: Fair Standing balance comment: reliant on UEs/walker, poor standing balance tolerance. Several small LOB, from which pt was able to self-correct by putting more weight into RW. Pt expresses fear of falling, as well as concern that he does not have the stamina to walk more than a few feet                           ADL either performed or assessed with clinical judgement   ADL Overall ADL's : Needs assistance/impaired         Upper Body Bathing: Minimal assistance   Lower Body Bathing: Minimal assistance   Upper Body Dressing : Minimal assistance   Lower Body Dressing: Contact guard assistance;Sit to/from stand Lower Body Dressing Details (indicate cue  type and reason): donning/doffing socks Toilet Transfer: BSC;RW;Stand-pivot;Minimal assistance   Toileting- Clothing Manipulation and Hygiene: Minimal assistance;Sit to/from stand Toileting - Clothing Manipulation Details (indicate cue type and reason): able to perform  with one-handed UE support     Functional mobility during ADLs: Minimal assistance       Vision Baseline Vision/History: Wears glasses Wears Glasses: At all times Patient Visual Report: No change from baseline     Perception     Praxis      Cognition Arousal/Alertness: Awake/alert Behavior During Therapy: WFL for tasks assessed/performed Overall Cognitive Status: Within Functional Limits for tasks assessed                                 General Comments: a few moments of slight confusion and perseveration        Exercises Other Exercises Other Exercises: transfers, sit<>stand, balance tolerance, dressing, bathing; educ re: falls prevention, AE for LB dressing and bed mobility, management of urinary incontinence; DC planning   Shoulder Instructions       General Comments      Pertinent Vitals/ Pain       Pain Assessment: No/denies pain  Home Living Family/patient expects to be discharged to:: Skilled nursing facility Living Arrangements: Spouse/significant other                                      Prior Functioning/Environment              Frequency  Min 2X/week        Progress Toward Goals  OT Goals(current goals can now be found in the care plan section)  Progress towards OT goals: Progressing toward goals  Acute Rehab OT Goals Patient Stated Goal: to get home OT Goal Formulation: With patient Time For Goal Achievement: 11/22/20 Potential to Achieve Goals: Good  Plan Discharge plan remains appropriate    Co-evaluation                 AM-PAC OT "6 Clicks" Daily Activity     Outcome Measure   Help from another person eating meals?: None Help from another person taking care of personal grooming?: None Help from another person toileting, which includes using toliet, bedpan, or urinal?: A Lot Help from another person bathing (including washing, rinsing, drying)?: A Little Help from another person to put  on and taking off regular upper body clothing?: None Help from another person to put on and taking off regular lower body clothing?: A Little 6 Click Score: 20    End of Session Equipment Utilized During Treatment: Rolling walker  OT Visit Diagnosis: Unsteadiness on feet (R26.81);History of falling (Z91.81);Muscle weakness (generalized) (M62.81)   Activity Tolerance Patient tolerated treatment well   Patient Left in chair;with call bell/phone within reach;with chair alarm set;with family/visitor present   Nurse Communication Other (comment) (mgmt of urinary incontinence)        Time: 2409-7353 OT Time Calculation (min): 110 min  Charges: OT General Charges $OT Visit: 1 Visit OT Treatments $Self Care/Home Management : 68-82 mins $Therapeutic Activity: 8-22 mins $Therapeutic Exercise: 8-22 mins  Josiah Lobo, PhD, MS, OTR/L 11/09/20, 2:35 PM

## 2020-11-09 NOTE — TOC Progression Note (Signed)
Transition of Care [facility]) - Progression Note    Patient Details  Name: Craig Wilson MRN: [id number] Date of Birth: 1941-05-13  Transition of Care [facility]) CM/SW Contact  Shelbie Hutching, RN Phone Number: 11/09/2020, 2:42 PM  Clinical Narrative:    [facility] has been arranged.  Patient is first on the list for pick up once a truck becomes available.    Expected Discharge Plan: [place] Barriers to Discharge: Barriers Resolved  Expected Discharge Plan and Services Expected Discharge Plan: [place]   Discharge Planning Services: CM Consult Post Acute Care Choice: [place] Living arrangements for the past 2 months: Single Family Home Expected Discharge Date: 11/09/20               DME Arranged: N/A DME Agency: NA       HH Arranged: NA           Social Determinants of Health (SDOH) Interventions    Readmission Risk Interventions No flowsheet data found.

## 2020-11-09 NOTE — Evaluation (Signed)
Clinical/Bedside Swallow Evaluation Patient Details  Name: KIREN MCISAAC MRN: [id number] Date of Birth: 1940-06-13  Today's Date: 11/09/2020 Time: SLP Start Time (ACUTE ONLY): 35 SLP Stop Time (ACUTE ONLY): 1 SLP Time Calculation (min) (ACUTE ONLY): 30 min  Past Medical History:  Past Medical History:  Diagnosis Date   Anemia    Bipolar disorder (HCC)    Chronic diarrhea    Colon polyps    Depression    Dermatophytosis    Diabetic neuropathy (HCC)    DJD (degenerative joint disease)    Dyspnea    Gout    Gross hematuria    HLD (hyperlipidemia)    HTN (hypertension)    Hyperkeratosis    Onychomycosis    Parkinson disease ([place])    Prostate cancer ([place])    Sleep apnea    Tremor    Type 2 diabetes mellitus ([place])    Past Surgical History:  Past Surgical History:  Procedure Laterality Date   cataract surgery Bilateral    COLONOSCOPY W/ BIOPSIES     FOOT SURGERY     PROSTATE SURGERY     removed   status post implantation of artificial urinary sphincter      TONSILLECTOMY     HPI:  Per H&P "80 year old male with past medical history of prostate cancer s/p radical prostatectomy in the early 2000's and subsequent radiation therapy 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     Patient is currently intubated and mechanically ventilated on sedation, he is unable to provide history so history was mostly obtained from patient's wife who is currently at the bedside and ED notes.  Per patient's wife, patient was recently discharged on 11/02/2020 following patient for AKI secondary to obstructive uropathy, and asymptomatic pyuria.  Since discharge patient's wife states that he has not been feeling well, has been complaining of generalized weakness, shortness of breath and fever.  He was seen  by his primary care physician on 11/03/2020 for suspected acute lower respiratory infection and was sent home on azithromycin and cough syrups.  His wife states this morning he woke up not feeling well and was attempting to go to the bathroom when she noticed that he was unsteady on his feet prior to falling on the floor. Patient's wife initially thought he had taken too much Tussionex with Codeine therefore she called EMS for lift assist back in the bed. EMS was called a second time due to worsening mental status post fall."   Assessment / Plan / Recommendation Clinical Impression  Pt presents with functional swallowing at bedside with no overt s/s of aspiration with any tested consistency. It should be noted that Pt has a congested cough at baseline (pneumonia) and throughout assessment and conversation.  Pt tolerated thin liquids, purees, and solids with no s/s of aspiration. Vocal quality remained clear and laryngeal elevation appeared adequate. Pt has poor dention had several teeth removed over the past year. Rec Dys 3 diet wityh chopped meats for ease of chewing. Pt is being discherged to nursing facility later today or tomorrow. No ST indicated at discharge unless Pt shows difficulty with new diet. Pt had no follow up questions but did mention concern that at transfer facility, staff needs to be aware that he has a bladder sphincter and shoulld not be catheterized. IConcernes passed to case manager assisting with  transfer. Prognosis good. SLP Visit Diagnosis: Dysphagia, oral phase (R13.11)    Aspiration Risk  Mild aspiration risk    Diet Recommendation Dysphagia 3 (Mech soft)   Liquid Administration via: Cup;Straw Medication Administration: Whole meds with liquid Supervision: Patient able to self feed Compensations: Slow rate;Small sips/bites Postural Changes: Seated upright at 90 degrees;Remain upright for at least 30 minutes after po intake    Other  Recommendations Oral Care  Recommendations: Patient independent with oral care   Follow up Recommendations Skilled Nursing facility      Frequency and Duration            Prognosis        Swallow Study   General Date of Onset: 11/05/20 HPI: Per H&P "80 year old male with past medical history of prostate cancer s/p radical prostatectomy in the early 2000's and subsequent radiation therapy 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     Patient is currently intubated and mechanically ventilated on sedation, he is unable to provide history so history was mostly obtained from patient's wife who is currently at the bedside and ED notes.  Per patient's wife, patient was recently discharged on 11/02/2020 following patient for AKI secondary to obstructive uropathy, and asymptomatic pyuria.  Since discharge patient's wife states that he has not been feeling well, has been complaining of generalized weakness, shortness of breath and fever.  He was seen by his primary care physician on 11/03/2020 for suspected acute lower respiratory infection and was sent home on azithromycin and cough syrups.  His wife states this morning he woke up not feeling well and was attempting to go to the bathroom when she noticed that he was unsteady on his feet prior to falling on the floor. Patient's wife initially thought he had taken too much Tussionex with Codeine therefore she called EMS for lift assist back in the bed. EMS was called a second time due to worsening mental status post fall." Type of Study: Bedside Swallow Evaluation Previous Swallow Assessment: N/A Diet Prior to this Study: Other (Comment) (Full liquid) Temperature Spikes Noted: No History of Recent Intubation: Yes Behavior/Cognition: Alert;Cooperative;Pleasant mood Oral Cavity Assessment:  Within Functional Limits Oral Care Completed by SLP: No Oral Cavity - Dentition: Poor condition;Missing dentition Vision: Functional for self-feeding Self-Feeding Abilities: Able to feed self Patient Positioning: Upright in chair Baseline Vocal Quality: Normal;Hoarse    Oral/Motor/Sensory Function Overall Oral Motor/Sensory Function: Within functional limits   Ice Chips Ice chips: Within functional limits Presentation: Self Fed   Thin Liquid Thin Liquid: Within functional limits Presentation: Cup;Spoon;Straw    Nectar Thick Nectar Thick Liquid: Not tested   Honey Thick Honey Thick Liquid: Not tested   Puree Puree: Within functional limits Presentation: Spoon   Solid     Solid: Within functional limits Presentation: [place] 11/09/2020,12:59 PM

## 2020-11-09 NOTE — Discharge Summary (Signed)
Physician Discharge Summary  Craig Wilson BHA:[id number] DOB: 1941-05-30 DOA: 11/05/2020  PCP: Adin Hector, MD  Admit date: 11/05/2020 Discharge date: 11/09/2020  Admitted From: Home  Disposition:  SNF   Recommendations for Outpatient Follow-up:  Follow up with Urology Dr. Bernardo Heater in 1 week Follow up with PCP Dr. Caryl Comes within 1 week of SNF discharge Continue Augmentin for 5 more days Do not place foley catheter Provide patient with incentive spirometer and flutter valve and utilize every 2 hours while awake      Home Health: N/A  Equipment/Devices: TBD at SNF  Discharge Condition: Fair  CODE STATUS: FULL Diet recommendation: Regular  Brief/Interim Summary: Craig Wilson is an 80 y.o. 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  In the ER chest x-ray showed pneumonia, the patient was intubated due to hypoxia and confusion and started on antibiotics.         PRINCIPAL HOSPITAL DIAGNOSIS: Septic shock due to pneumonia    Discharge Diagnoses:  Septic shock secondary to pneumonia Acute hypoxic and hypercapnic respiratory failure due to pneumonia sepsis Acute metabolic encephalopathy due to sepsis Patient presented with tachypnea, somnolence, hypoxic respiratory failure in the setting of pneumonia with septic shock.  He was admitted to ICU, intubated, placed on Levophed and antibiotics.  Urine and blood cultures were negative, chest x-ray showed pneumonia, antibiotics were narrowed to Unasyn, white count resolved, heart rate and respiratory rate resolved, mentation returned to normal and he was able to take p.o. well.  Discharged to complete 7 days antibiotics with Augmentin.   Parkinson's  disease Continue Sinemet, Requip   Recent AKI  Patient had recently been admitted with creatinine 5 due to obstruction of his artificial urinary sphincter.    Creatinine resolved to normal, new AKI ruled out.     History of radiation and prostatectomy induced urinary incontinence with artificial sphincter The patient's sphincter had been activated 5/25, deactivated 5/31 due to AKI and urinary obstruction.  During this hospitalization, the patient had a Foley catheter for several days, was evaluated here by urology/Dr. Gloriann Loan, who recommended the Foley catheter remain in only briefly.  Recommend the patient follow-up with [facility], or Dr. Bernardo Heater soon as possible.  For now, the patient should not have a Foley catheter placed.  He is okay for condom catheter, collection bag, depends, or other absorbent urine collection.   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  Treated with colchicine prior to discharge.     Discharge Instructions  Discharge Instructions     Increase activity slowly   Complete by: As directed       Allergies as of 11/09/2020       Reactions   Carbidopa W-levodopa Other (See Comments)   irrational behavior , felt zombie like   Carbidopa-levodopa Other (See Comments)   irrational behavior , felt zombie like   Latex Other (See Comments)   Internal such as catheters. Latex gloves are okay   Solifenacin Other (See Comments)        Medication List     STOP taking these medications    mirtazapine 7.5 MG tablet Commonly known as: REMERON  TAKE these medications    amoxicillin-clavulanate 875-125 MG tablet Commonly known as: Augmentin Take 1 tablet by mouth 2 (two) times daily for 5 days.   atorvastatin 40 MG tablet Commonly known as: LIPITOR Take 40 mg by mouth every evening.   benzonatate 200 MG capsule Commonly known as:  TESSALON Take 1 capsule by mouth 3 (three) times daily.   carbidopa-levodopa 25-100 MG tablet Commonly known as: SINEMET IR Take 1 tablet by mouth 4 (four) times daily.   chlorpheniramine-HYDROcodone 10-8 MG/5ML Suer Commonly known as: TUSSIONEX Take 5 mLs by mouth every 12 (twelve) hours.   docusate sodium 100 MG capsule Commonly known as: COLACE Take 1 capsule (100 mg total) by mouth 2 (two) times daily.   lithium carbonate 300 MG capsule Take 300 mg by mouth 2 (two) times daily.   polyethylene glycol 17 g packet Commonly known as: MIRALAX / GLYCOLAX Take 17 g by mouth daily as needed for moderate constipation.   rOPINIRole 2 MG tablet Commonly known as: REQUIP Take 2 mg by mouth at bedtime.   traZODone 50 MG tablet Commonly known as: DESYREL Take 50-100 mg by mouth at bedtime.        Follow-up Information     Stoioff, Ronda Fairly, MD. Schedule an appointment as soon as possible for a visit in 1 week(s).   Specialty: Urology Why: Or with Dr. McDiarmid if Dr. Bernardo Heater unavailable Contact information: [place] [address] [phone]         Adin Hector, MD Follow up.   Specialty: Internal Medicine Why: Within 1 week of SNF discharge Contact information: [address] [facility] [place] [address] [phone]                Allergies  Allergen Reactions   Carbidopa W-Levodopa Other (See Comments)    irrational behavior , felt zombie like   Carbidopa-Levodopa Other (See Comments)    irrational behavior , felt zombie like   Latex Other (See Comments)    Internal such as catheters. Latex gloves are okay   Solifenacin Other (See Comments)    Consultations: Urology Critical care   Procedures/Studies: DG Abd 1 View  Result Date: 11/05/2020 CLINICAL DATA:  Nasogastric tube placement EXAM: ABDOMEN - 1 VIEW COMPARISON:  Same day CT abdomen pelvis FINDINGS: Nasogastric tube with tip and side  port in the stomach. The bowel gas pattern is normal. Surgical clips overlie the pelvis. Radiopaque artificial urinary sphincter reservoir and tubing visualized over the right pelvis. IMPRESSION: Nasogastric tube with tip and side port in the stomach. Electronically Signed   By: Dahlia Bailiff MD   On: 11/05/2020 14:46   CT Head Wo Contrast  Result Date: 11/05/2020 CLINICAL DATA:  Fall, altered mental status, head trauma, neck trauma; history hypertension, Parkinson's disease, type II diabetes mellitus, prostate cancer EXAM: CT HEAD WITHOUT CONTRAST CT CERVICAL SPINE WITHOUT CONTRAST TECHNIQUE: Multidetector CT imaging of the head and cervical spine was performed following the standard protocol without intravenous contrast. Multiplanar CT image reconstructions of the cervical spine were also generated. COMPARISON:  CT head 01/14/2018 FINDINGS: CT HEAD FINDINGS Brain: Diffuse age-related atrophy. Normal ventricular morphology. No midline shift or mass effect. Otherwise normal appearance of brain parenchyma. No intracranial hemorrhage, mass lesion, or evidence of acute infarction. No extra-axial fluid collections. Vascular: No hyperdense vessels. Mild atherosclerotic calcification of internal carotid arteries at skull base Skull: Demineralized but intact Sinuses/Orbits: Mucosal thickening throughout ethmoid air  cells, frontal sinus, nasal passages, maxillary sinuses. Other: N/A CT CERVICAL SPINE FINDINGS Alignment: Minimal retrolisthesis at C4-C5. Remaining alignments normal. Skull base and vertebrae: Slight osseous demineralization. Vertebral body heights maintained. Disc space narrowing C4-C5 with scattered endplate spurs at A1-P3, C5-C6, and C6-C7. Skull base intact. No fracture, additional subluxation, or bone destruction. Soft tissues and spinal canal: Prevertebral soft tissues normal thickness. Endotracheal and nasogastric tubes noted. 10 mm RIGHT thyroid nodule seen Not clinically significant; no follow-up  imaging recommended (ref: J Am Coll Radiol. 2015 Feb;12(2): 143-50). Disc levels:  No specific abnormalities Upper chest: Tips of lung apices clear Other: N/A IMPRESSION: Generalized age-related atrophy. No acute intracranial abnormalities. Degenerative disc disease changes of the cervical spine. No acute cervical spine abnormalities. Electronically Signed   By: Lavonia Dana M.D.   On: 11/05/2020 10:36   CT Angio Chest PE W and/or Wo Contrast  Result Date: 11/05/2020 CLINICAL DATA:  Fall from standing, intubated. History of prostate cancer status post prostatectomy in 2006. EXAM: CT ANGIOGRAPHY CHEST CT ABDOMEN AND PELVIS WITH CONTRAST TECHNIQUE: Multidetector CT imaging of the chest was performed using the standard protocol during bolus administration of intravenous contrast. Multiplanar CT image reconstructions and MIPs were obtained to evaluate the vascular anatomy. Multidetector CT imaging of the abdomen and pelvis was performed using the standard protocol during bolus administration of intravenous contrast. CONTRAST:  25mL OMNIPAQUE IOHEXOL 350 MG/ML SOLN COMPARISON:  CT chest, abdomen, and pelvis dated 07/03/2020. FINDINGS: CTA CHEST FINDINGS Cardiovascular: Satisfactory opacification of the pulmonary arteries to the segmental level. No evidence of pulmonary embolism. Vascular calcifications are seen in the coronary arteries and aortic arch. Normal heart size. No pericardial effusion. Mediastinum/Nodes: An enlarged right hilar lymph node measures 11 mm in short axis (series 5, image 47) and is likely reactive. There is no mediastinal or axillary lymphadenopathy. An endotracheal tube terminates in the midthoracic trachea. An enteric tube terminates at the gastroesophageal junction. Aspirated debris is seen in the trachea, both mainstem bronchi, and multiple bronchi supplying the bilateral lower lobes. Thyroid gland is unremarkable. Lungs/Pleura: Patchy bilateral ground-glass opacities and areas of  consolidation have a lower lobe predominance. There is a small right pleural effusion. There is no left pleural effusion. There is no pneumothorax. Musculoskeletal: Degenerative changes are seen in the spine. No acute or significant osseous findings. Review of the MIP images confirms the above findings. CT ABDOMEN and PELVIS FINDINGS Hepatobiliary: A 1.5 cm cyst is seen in the inferior aspect of the right hepatic lobe. A gallstone is seen in the gallbladder. No gallbladder wall thickening or biliary dilatation. Pancreas: Unremarkable. No pancreatic ductal dilatation or surrounding inflammatory changes. Spleen: Normal in size without focal abnormality. Adrenals/Urinary Tract: Adrenal glands are unremarkable. Bilateral cysts measure up to 7.8 cm on the right and 1.3 cm on the left. Otherwise, the kidneys are normal, without renal calculi, solid mass, or hydronephrosis. Bladder is unremarkable. Stomach/Bowel: Stomach is within normal limits. Appendix appears normal. There is colonic diverticulosis without evidence of diverticulitis. No evidence of bowel wall thickening, distention, or inflammatory changes. Vascular/Lymphatic: Aortic atherosclerosis. No enlarged abdominal or pelvic lymph nodes. Reproductive: The patient is status post prostatectomy. An artificial urinary sphincter is redemonstrated. Other: No abdominal wall hernia or abnormality. No abdominopelvic ascites. Musculoskeletal: Degenerative changes are seen in the spine. Review of the MIP images confirms the above findings. IMPRESSION: 1. Multifocal bilateral ground-glass opacities and consolidation with debris in the airways is suggestive of aspiration pneumonia. Small right pleural effusion. 2. No evidence  of pulmonary embolism. 3. No acute traumatic injury in the chest, abdomen, or pelvis. Electronically Signed   By: Zerita Boers M.D.   On: 11/05/2020 10:53   CT Cervical Spine Wo Contrast  Result Date: 11/05/2020 CLINICAL DATA:  Fall, altered mental  status, head trauma, neck trauma; history hypertension, Parkinson's disease, type II diabetes mellitus, prostate cancer EXAM: CT HEAD WITHOUT CONTRAST CT CERVICAL SPINE WITHOUT CONTRAST TECHNIQUE: Multidetector CT imaging of the head and cervical spine was performed following the standard protocol without intravenous contrast. Multiplanar CT image reconstructions of the cervical spine were also generated. COMPARISON:  CT head 01/14/2018 FINDINGS: CT HEAD FINDINGS Brain: Diffuse age-related atrophy. Normal ventricular morphology. No midline shift or mass effect. Otherwise normal appearance of brain parenchyma. No intracranial hemorrhage, mass lesion, or evidence of acute infarction. No extra-axial fluid collections. Vascular: No hyperdense vessels. Mild atherosclerotic calcification of internal carotid arteries at skull base Skull: Demineralized but intact Sinuses/Orbits: Mucosal thickening throughout ethmoid air cells, frontal sinus, nasal passages, maxillary sinuses. Other: N/A CT CERVICAL SPINE FINDINGS Alignment: Minimal retrolisthesis at C4-C5. Remaining alignments normal. Skull base and vertebrae: Slight osseous demineralization. Vertebral body heights maintained. Disc space narrowing C4-C5 with scattered endplate spurs at V2-Z3, C5-C6, and C6-C7. Skull base intact. No fracture, additional subluxation, or bone destruction. Soft tissues and spinal canal: Prevertebral soft tissues normal thickness. Endotracheal and nasogastric tubes noted. 10 mm RIGHT thyroid nodule seen Not clinically significant; no follow-up imaging recommended (ref: J Am Coll Radiol. 2015 Feb;12(2): 143-50). Disc levels:  No specific abnormalities Upper chest: Tips of lung apices clear Other: N/A IMPRESSION: Generalized age-related atrophy. No acute intracranial abnormalities. Degenerative disc disease changes of the cervical spine. No acute cervical spine abnormalities. Electronically Signed   By: Lavonia Dana M.D.   On: 11/05/2020 10:36    CT ABDOMEN PELVIS W CONTRAST  Result Date: 11/05/2020 CLINICAL DATA:  Fall from standing, intubated. History of prostate cancer status post prostatectomy in 2006. EXAM: CT ANGIOGRAPHY CHEST CT ABDOMEN AND PELVIS WITH CONTRAST TECHNIQUE: Multidetector CT imaging of the chest was performed using the standard protocol during bolus administration of intravenous contrast. Multiplanar CT image reconstructions and MIPs were obtained to evaluate the vascular anatomy. Multidetector CT imaging of the abdomen and pelvis was performed using the standard protocol during bolus administration of intravenous contrast. CONTRAST:  3mL OMNIPAQUE IOHEXOL 350 MG/ML SOLN COMPARISON:  CT chest, abdomen, and pelvis dated 07/03/2020. FINDINGS: CTA CHEST FINDINGS Cardiovascular: Satisfactory opacification of the pulmonary arteries to the segmental level. No evidence of pulmonary embolism. Vascular calcifications are seen in the coronary arteries and aortic arch. Normal heart size. No pericardial effusion. Mediastinum/Nodes: An enlarged right hilar lymph node measures 11 mm in short axis (series 5, image 47) and is likely reactive. There is no mediastinal or axillary lymphadenopathy. An endotracheal tube terminates in the midthoracic trachea. An enteric tube terminates at the gastroesophageal junction. Aspirated debris is seen in the trachea, both mainstem bronchi, and multiple bronchi supplying the bilateral lower lobes. Thyroid gland is unremarkable. Lungs/Pleura: Patchy bilateral ground-glass opacities and areas of consolidation have a lower lobe predominance. There is a small right pleural effusion. There is no left pleural effusion. There is no pneumothorax. Musculoskeletal: Degenerative changes are seen in the spine. No acute or significant osseous findings. Review of the MIP images confirms the above findings. CT ABDOMEN and PELVIS FINDINGS Hepatobiliary: A 1.5 cm cyst is seen in the inferior aspect of the right hepatic lobe. A  gallstone is seen in the  gallbladder. No gallbladder wall thickening or biliary dilatation. Pancreas: Unremarkable. No pancreatic ductal dilatation or surrounding inflammatory changes. Spleen: Normal in size without focal abnormality. Adrenals/Urinary Tract: Adrenal glands are unremarkable. Bilateral cysts measure up to 7.8 cm on the right and 1.3 cm on the left. Otherwise, the kidneys are normal, without renal calculi, solid mass, or hydronephrosis. Bladder is unremarkable. Stomach/Bowel: Stomach is within normal limits. Appendix appears normal. There is colonic diverticulosis without evidence of diverticulitis. No evidence of bowel wall thickening, distention, or inflammatory changes. Vascular/Lymphatic: Aortic atherosclerosis. No enlarged abdominal or pelvic lymph nodes. Reproductive: The patient is status post prostatectomy. An artificial urinary sphincter is redemonstrated. Other: No abdominal wall hernia or abnormality. No abdominopelvic ascites. Musculoskeletal: Degenerative changes are seen in the spine. Review of the MIP images confirms the above findings. IMPRESSION: 1. Multifocal bilateral ground-glass opacities and consolidation with debris in the airways is suggestive of aspiration pneumonia. Small right pleural effusion. 2. No evidence of pulmonary embolism. 3. No acute traumatic injury in the chest, abdomen, or pelvis. Electronically Signed   By: Zerita Boers M.D.   On: 11/05/2020 10:53   US RENAL  Result Date: 10/31/2020 CLINICAL DATA:  Renal failure EXAM: RENAL / URINARY TRACT ULTRASOUND COMPLETE COMPARISON:  07/03/2020 FINDINGS: Right Kidney: Renal measurements: 10.8 x 4.9 by 6.6 cm = volume: 181.6 mL. There is increased renal cortical echotexture consistent with medical renal disease. Stable right renal cysts, measuring up to 5.1 x 5.8 x 5.4 cm in the upper pole. No hydronephrosis. Left Kidney: Renal measurements: 12.4 x 5.6 by 6.5 cm = volume: 238.4 mL. Grossly normal echogenicity. Stable 1  cm cyst lateral aspect left kidney. No hydronephrosis. Bladder: Bladder is significantly distended, volume measuring nearly 800 cc. No filling defects or irregularities. Other: None. IMPRESSION: 1. Increased right renal cortical echotexture consistent with medical renal disease. 2. Bilateral renal cysts. 3. Distended urinary bladder without focal abnormality. Electronically Signed   By: Randa Ngo M.D.   On: 10/31/2020 20:22   CT T-SPINE NO CHARGE  Result Date: 11/05/2020 CLINICAL DATA:  Fall from standing, found down EXAM: CT THORACIC SPINE WITHOUT CONTRAST TECHNIQUE: Multidetector CT images of the thoracic were obtained using the standard protocol without intravenous contrast. COMPARISON:  CT chest dated 07/03/2020. FINDINGS: Alignment: Normal. Vertebrae: No acute fracture or focal pathologic process. Paraspinal and other soft tissues: Patchy bilateral ground-glass opacities and consolidation with a lower lobe predominance is partially imaged. There is a small right pleural effusion. Please see same day chest CT for further details. Disc levels: Multilevel mild-to-moderate degenerative disc and joint disease. IMPRESSION: No acute osseous injury. Electronically Signed   By: Zerita Boers M.D.   On: 11/05/2020 10:53   CT L-SPINE NO CHARGE  Result Date: 11/05/2020 CLINICAL DATA:  Fall from standing, found down. EXAM: CT LUMBAR SPINE WITHOUT CONTRAST TECHNIQUE: Multidetector CT imaging of the lumbar spine was performed without intravenous contrast administration. Multiplanar CT image reconstructions were also generated. COMPARISON:  CT abdomen pelvis dated 07/03/2020. FINDINGS: Segmentation: 5 lumbar type vertebrae. Alignment: Normal. Vertebrae: No acute fracture or focal pathologic process. Paraspinal and other soft tissues: No acute finding. Disc levels: Multilevel degenerative disc and joint disease up to moderate severity at L5-S1 where there is mild right and moderate left neuroforaminal stenosis.  IMPRESSION: No acute osseous injury. Electronically Signed   By: Zerita Boers M.D.   On: 11/05/2020 10:51   DG Chest Port 1 View  Result Date: 11/05/2020 CLINICAL DATA:  80 year old male intubated. EXAM:  PORTABLE CHEST 1 VIEW COMPARISON:  Portable chest 0755 hours today and earlier. FINDINGS: Portable AP supine view at 0848 hours. Endotracheal tube tip in good position just below the clavicles. Enteric tube courses in the esophagus but cannot be visualized below the mid cardiac contour. Larger lung volumes. Mediastinal contours are stable. Somewhat coarse bilateral pulmonary opacity is stable. No pneumothorax or new pulmonary opacity identified. Pacer or resuscitation pads project over the chest now. No acute osseous abnormality identified. IMPRESSION: 1. Endotracheal tube tip in good position. Enteric tube courses into the esophagus but cannot be visualized below the mid cardiac contour. Recommend follow-up portable upper abdominal radiograph. 2. Larger lung volumes with stable coarse bilateral pulmonary opacity. Electronically Signed   By: Genevie Ann M.D.   On: 11/05/2020 09:01   DG Chest Port 1 View  Result Date: 11/05/2020 CLINICAL DATA:  Shortness of breath EXAM: PORTABLE CHEST 1 VIEW COMPARISON:  March 06, 2018 chest radiograph; chest CT July 03, 2020 FINDINGS: There is a subtle area of ill-defined opacity in the right upper lobe. Lungs elsewhere are clear. Heart size and pulmonary vascularity are normal. No adenopathy. There is degenerative change in the thoracic spine. IMPRESSION: Rather subtle area of opacity right upper lobe, a likely focus of pneumonia. Lungs elsewhere clear. Heart size normal. Followup PA and lateral chest radiographs recommended in 3-4 weeks following trial of antibiotic therapy to ensure resolution and exclude underlying malignancy. Electronically Signed   By: Lowella Grip III M.D.   On: 11/05/2020 08:09   DG Knee Complete 4 Views Right  Result Date: 11/05/2020 CLINICAL  DATA:  Pain following fall EXAM: RIGHT KNEE - COMPLETE 4+ VIEW COMPARISON:  None. FINDINGS: Frontal, lateral, and bilateral oblique views were obtained. There is no fracture or dislocation. No appreciable joint effusion. There is mild narrowing of the patellofemoral joint. Other joint spaces appear normal. No erosive change. IMPRESSION: Slight narrowing patellofemoral joint. Other joint spaces appear unremarkable. No evident fracture, dislocation, or joint effusion. Electronically Signed   By: Lowella Grip III M.D.   On: 11/05/2020 08:44      Subjective: No headache, chest pain, abdominal pain.  Still has some cough, still weak and tired.  No confusion, fever, respiratory distress.  Discharge Exam: Vitals:   11/09/20 0805 11/09/20 1126  BP: (!) 186/73 138/62  Pulse: 91 87  Resp: 14 16  Temp: 97.8 F (36.6 C) 98.9 F (37.2 C)  SpO2: 96% 93%   Vitals:   11/09/20 0443 11/09/20 0733 11/09/20 0805 11/09/20 1126  BP:   (!) 186/73 138/62  Pulse:   91 87  Resp:   14 16  Temp:   97.8 F (36.6 C) 98.9 F (37.2 C)  TempSrc:   Oral Oral  SpO2:  96% 96% 93%  Weight: 67.7 kg     Height:        General: Pt is alert, awake, not in acute distress, sitting up on the commode Cardiovascular: RRR, nl S1-S2, no murmurs appreciated.   No LE edema.   Respiratory: Normal respiratory rate and rhythm.  CTAB without rales or wheezes. MSK: Mild redness of the left great toe Abdominal: Abdomen soft and non-tender.  No distension or HSM.   Neuro/Psych: Strength symmetric in upper and lower extremities.  Judgment and insight appear normal.   The results of significant diagnostics from this hospitalization (including imaging, microbiology, ancillary and laboratory) are listed below for reference.     Microbiology: Recent Results (from the past 240 hour(s))  Urine culture     Status: Abnormal   Collection Time: 10/31/20  3:02 PM   Specimen: Urine, Random  Result Value Ref Range Status   Specimen  Description   Final    URINE, RANDOM Performed at [facility], [address], [place], [place] [address]    Special Requests   Final    NONE Performed at [facility], [address] [place], [place] [address]    Culture MULTIPLE SPECIES PRESENT, SUGGEST RECOLLECTION (A)  Final   Report Status 11/02/2020 FINAL  Final  SARS CORONAVIRUS 2 (TAT 6-24 HRS) Nasopharyngeal Nasopharyngeal Swab     Status: None   Collection Time: 10/31/20  3:24 PM   Specimen: Nasopharyngeal Swab  Result Value Ref Range Status   SARS Coronavirus 2 NEGATIVE NEGATIVE Final    Comment: (NOTE) SARS-CoV-2 target nucleic acids are NOT DETECTED.  The SARS-CoV-2 RNA is generally detectable in upper and lower respiratory specimens during the acute phase of infection. Negative results do not preclude SARS-CoV-2 infection, do not rule out co-infections with other pathogens, and should not be used as the sole basis for treatment or other patient management decisions. Negative results must be combined with clinical observations, patient history, and epidemiological information. The expected result is Negative.  Fact Sheet for Patients: SugarRoll.be  Fact Sheet for Healthcare Providers: https://www.woods-mathews.com/  This test is not yet approved or cleared by the [place] FDA and  has been authorized for detection and/or diagnosis of SARS-CoV-2 by FDA under an Emergency Use Authorization (EUA). This EUA will remain  in effect (meaning this test can be used) for the duration of the COVID-19 declaration under Se ction 564(b)(1) of the Act, 21 U.S.C. section 360bbb-3(b)(1), unless the authorization is terminated or revoked sooner.  Performed at [facility], [place] [address], [place], [place] [address]   Blood culture (routine single)     Status: None (Preliminary result)   Collection Time: 11/05/20  7:53 AM   Specimen: BLOOD  Result Value  Ref Range Status   Specimen Description BLOOD LEFT ANTECUBITAL  Final   Special Requests   Final    BOTTLES DRAWN AEROBIC AND ANAEROBIC Blood Culture adequate volume   Culture   Final    NO GROWTH 4 DAYS Performed at [facility], [address], [place], [place] [address]    Report Status PENDING  Incomplete  Resp Panel by RT-PCR (Flu A&B, Covid) Nasopharyngeal Swab     Status: None   Collection Time: 11/05/20  7:54 AM   Specimen: Nasopharyngeal Swab; Nasopharyngeal(NP) swabs in vial transport medium  Result Value Ref Range Status   SARS Coronavirus 2 by RT PCR NEGATIVE NEGATIVE Final    Comment: (NOTE) SARS-CoV-2 target nucleic acids are NOT DETECTED.  The SARS-CoV-2 RNA is generally detectable in upper respiratory specimens during the acute phase of infection. The lowest concentration of SARS-CoV-2 viral copies this assay can detect is 138 copies/mL. A negative result does not preclude SARS-Cov-2 infection and should not be used as the sole basis for treatment or other patient management decisions. A negative result may occur with  improper specimen collection/handling, submission of specimen other than nasopharyngeal swab, presence of viral mutation(s) within the areas targeted by this assay, and inadequate number of viral copies(<138 copies/mL). A negative result must be combined with clinical observations, patient history, and epidemiological information. The expected result is Negative.  Fact Sheet for Patients:  EntrepreneurPulse.com.au  Fact Sheet for Healthcare Providers:  IncredibleEmployment.be  This test is no t yet approved or cleared by the [place] and  has been authorized for detection and/or diagnosis of SARS-CoV-2 by FDA under an Emergency Use Authorization (EUA). This EUA will remain  in effect (meaning this test can be used) for the duration of the COVID-19 declaration under Section 564(b)(1) of the  Act, 21 U.S.C.section 360bbb-3(b)(1), unless the authorization is terminated  or revoked sooner.       Influenza A by PCR NEGATIVE NEGATIVE Final   Influenza B by PCR NEGATIVE NEGATIVE Final    Comment: (NOTE) The Xpert Xpress SARS-CoV-2/FLU/RSV plus assay is intended as an aid in the diagnosis of influenza from Nasopharyngeal swab specimens and should not be used as a sole basis for treatment. Nasal washings and aspirates are unacceptable for Xpert Xpress SARS-CoV-2/FLU/RSV testing.  Fact Sheet for Patients: EntrepreneurPulse.com.au  Fact Sheet for Healthcare Providers: IncredibleEmployment.be  This test is not yet approved or cleared by the [place] FDA and has been authorized for detection and/or diagnosis of SARS-CoV-2 by FDA under an Emergency Use Authorization (EUA). This EUA will remain in effect (meaning this test can be used) for the duration of the COVID-19 declaration under Section 564(b)(1) of the Act, 21 U.S.C. section 360bbb-3(b)(1), unless the authorization is terminated or revoked.  Performed at [facility], [address], [place], [place] [address]   Urine culture     Status: None   Collection Time: 11/05/20 10:21 AM   Specimen: In/Out Cath Urine  Result Value Ref Range Status   Specimen Description   Final    IN/OUT CATH URINE Performed at [facility], [address] [place], [place] [address]    Special Requests   Final    NONE Performed at [facility], [address], [place], [place] [address]    Culture   Final    NO GROWTH Performed at [facility], [place] [address] [place], [place] [address]    Report Status 11/06/2020 FINAL  Final  MRSA PCR Screening     Status: None   Collection Time: 11/05/20  1:54 PM   Specimen: Nasopharyngeal  Result Value Ref Range Status   MRSA by PCR NEGATIVE NEGATIVE Final    Comment:        The GeneXpert MRSA Assay (FDA approved for  NASAL specimens only), is one component of a comprehensive MRSA colonization surveillance program. It is not intended to diagnose MRSA infection nor to guide or monitor treatment for MRSA infections. Performed at [facility], [place] [place], [place] [address]      Labs: BNP (last 3 results) No results for input(s): BNP in the last 8760 hours. Basic Metabolic Panel: Recent Labs  Lab 11/05/20 0754 11/06/20 0421 11/07/20 0417 11/08/20 0524  NA 137 137 137 142  K 4.2 4.6 4.3 4.3  CL 103 107 110 109  CO2 23 22 20* 25  GLUCOSE 334* 167* 131* 114*  BUN 27* 24* 25* 22  CREATININE 1.32* 0.97 1.05 1.03  CALCIUM 9.9 9.7 9.5 9.7  MG 2.1 1.9 2.0  --   PHOS  --  4.2 3.1  --    Liver Function Tests: Recent Labs  Lab 11/05/20 0754  AST 16  ALT 12  ALKPHOS 81  BILITOT 0.6  PROT 6.6  ALBUMIN 3.3*   No results for input(s): LIPASE, AMYLASE in the last 168 hours. No results for input(s): AMMONIA in the last 168 hours. CBC: Recent Labs  Lab 11/05/20 0753 11/06/20 0421 11/07/20 0417 11/08/20 0524  WBC 30.9* 20.9* 16.6* 11.9*  NEUTROABS 28.1* 20.0*  --   --   HGB 11.1* 9.6* 8.9* 10.4*  HCT 34.9* 29.4* 28.4* 32.5*  MCV 103.6* 102.1* 104.4* 103.5*  PLT 278 184 160 154   Cardiac Enzymes: Recent Labs  Lab 11/05/20 0754  CKTOTAL 260   BNP: Invalid input(s): POCBNP CBG: Recent Labs  Lab 11/08/20 2305 11/09/20 0026 11/09/20 0424 11/09/20 0812 11/09/20 1125  GLUCAP 70 144* 118* 102* 264*   D-Dimer No results for input(s): DDIMER in the last 72 hours. Hgb A1c No results for input(s): HGBA1C in the last 72 hours. Lipid Profile No results for input(s): CHOL, HDL, LDLCALC, TRIG, CHOLHDL, LDLDIRECT in the last 72 hours. Thyroid function studies No results for input(s): TSH, T4TOTAL, T3FREE, THYROIDAB in the last 72 hours.  Invalid input(s): FREET3 Anemia work up Recent Labs    11/07/20 0417 11/07/20 1529  VITAMINB12  --  727  FOLATE 9.6   --   FERRITIN 227  --   TIBC 176*  --   IRON 58  --   RETICCTPCT 2.5  --    Urinalysis    Component Value Date/Time   COLORURINE YELLOW (A) 11/05/2020 1021   APPEARANCEUR CLEAR (A) 11/05/2020 1021   APPEARANCEUR Clear 03/11/2019 1354   LABSPEC 1.014 11/05/2020 1021   PHURINE 6.0 11/05/2020 1021   GLUCOSEU 50 (A) 11/05/2020 1021   HGBUR NEGATIVE 11/05/2020 1021   BILIRUBINUR NEGATIVE 11/05/2020 1021   BILIRUBINUR Negative 03/11/2019 1354   KETONESUR NEGATIVE 11/05/2020 1021   PROTEINUR 30 (A) 11/05/2020 1021   UROBILINOGEN 0.2 03/23/2010 1424   NITRITE NEGATIVE 11/05/2020 1021   LEUKOCYTESUR NEGATIVE 11/05/2020 1021   Sepsis Labs Invalid input(s): PROCALCITONIN,  WBC,  LACTICIDVEN Microbiology Recent Results (from the past 240 hour(s))  Urine culture     Status: Abnormal   Collection Time: 10/31/20  3:02 PM   Specimen: Urine, Random  Result Value Ref Range Status   Specimen Description   Final    URINE, RANDOM Performed at [facility], [address], [place], [place] [address]    Special Requests   Final    NONE Performed at [facility], [address] [place], [place] [address]    Culture MULTIPLE SPECIES PRESENT, SUGGEST RECOLLECTION (A)  Final   Report Status 11/02/2020 FINAL  Final  SARS CORONAVIRUS 2 (TAT 6-24 HRS) Nasopharyngeal Nasopharyngeal Swab     Status: None   Collection Time: 10/31/20  3:24 PM   Specimen: Nasopharyngeal Swab  Result Value Ref Range Status   SARS Coronavirus 2 NEGATIVE NEGATIVE Final    Comment: (NOTE) SARS-CoV-2 target nucleic acids are NOT DETECTED.  The SARS-CoV-2 RNA is generally detectable in upper and lower respiratory specimens during the acute phase of infection. Negative results do not preclude SARS-CoV-2 infection, do not rule out co-infections with other pathogens, and should not be used as the sole basis for treatment or other patient management decisions. Negative results must be combined with  clinical observations, patient history, and epidemiological information. The expected result is Negative.  Fact Sheet for Patients: SugarRoll.be  Fact Sheet for Healthcare Providers: https://www.woods-mathews.com/  This test is not yet approved or cleared by the [place] FDA and  has been authorized for detection and/or diagnosis of SARS-CoV-2 by FDA under an Emergency Use Authorization (EUA). This EUA will remain  in effect (meaning this test can be used) for the duration of  the COVID-19 declaration under Se ction 564(b)(1) of the Act, 21 U.S.C. section 360bbb-3(b)(1), unless the authorization is terminated or revoked sooner.  Performed at [facility], [place] [address] [place], [place] [address]   Blood culture (routine single)     Status: None (Preliminary result)   Collection Time: 11/05/20  7:53 AM   Specimen: BLOOD  Result Value Ref Range Status   Specimen Description BLOOD LEFT ANTECUBITAL  Final   Special Requests   Final    BOTTLES DRAWN AEROBIC AND ANAEROBIC Blood Culture adequate volume   Culture   Final    NO GROWTH 4 DAYS Performed at [facility], [address], [place], [place] [address]    Report Status PENDING  Incomplete  Resp Panel by RT-PCR (Flu A&B, Covid) Nasopharyngeal Swab     Status: None   Collection Time: 11/05/20  7:54 AM   Specimen: Nasopharyngeal Swab; Nasopharyngeal(NP) swabs in vial transport medium  Result Value Ref Range Status   SARS Coronavirus 2 by RT PCR NEGATIVE NEGATIVE Final    Comment: (NOTE) SARS-CoV-2 target nucleic acids are NOT DETECTED.  The SARS-CoV-2 RNA is generally detectable in upper respiratory specimens during the acute phase of infection. The lowest concentration of SARS-CoV-2 viral copies this assay can detect is 138 copies/mL. A negative result does not preclude SARS-Cov-2 infection and should not be used as the sole basis for treatment or other patient  management decisions. A negative result may occur with  improper specimen collection/handling, submission of specimen other than nasopharyngeal swab, presence of viral mutation(s) within the areas targeted by this assay, and inadequate number of viral copies(<138 copies/mL). A negative result must be combined with clinical observations, patient history, and epidemiological information. The expected result is Negative.  Fact Sheet for Patients:  EntrepreneurPulse.com.au  Fact Sheet for Healthcare Providers:  IncredibleEmployment.be  This test is no t yet approved or cleared by the [place] FDA and  has been authorized for detection and/or diagnosis of SARS-CoV-2 by FDA under an Emergency Use Authorization (EUA). This EUA will remain  in effect (meaning this test can be used) for the duration of the COVID-19 declaration under Section 564(b)(1) of the Act, 21 U.S.C.section 360bbb-3(b)(1), unless the authorization is terminated  or revoked sooner.       Influenza A by PCR NEGATIVE NEGATIVE Final   Influenza B by PCR NEGATIVE NEGATIVE Final    Comment: (NOTE) The Xpert Xpress SARS-CoV-2/FLU/RSV plus assay is intended as an aid in the diagnosis of influenza from Nasopharyngeal swab specimens and should not be used as a sole basis for treatment. Nasal washings and aspirates are unacceptable for Xpert Xpress SARS-CoV-2/FLU/RSV testing.  Fact Sheet for Patients: EntrepreneurPulse.com.au  Fact Sheet for Healthcare Providers: IncredibleEmployment.be  This test is not yet approved or cleared by the [place] FDA and has been authorized for detection and/or diagnosis of SARS-CoV-2 by FDA under an Emergency Use Authorization (EUA). This EUA will remain in effect (meaning this test can be used) for the duration of the COVID-19 declaration under Section 564(b)(1) of the Act, 21 U.S.C. section 360bbb-3(b)(1),  unless the authorization is terminated or revoked.  Performed at [facility], [address] [place], [place] [address]   Urine culture     Status: None   Collection Time: 11/05/20 10:21 AM   Specimen: In/Out Cath Urine  Result Value Ref Range Status   Specimen Description   Final    IN/OUT CATH URINE Performed at [facility], [address], [place], [place] [address]    Special Requests   Final    NONE Performed at [facility], [address] [place], [place] [address]    Culture   Final    NO GROWTH Performed at [facility], [place] [address] [place], [place] [address]    Report Status 11/06/2020 FINAL  Final  MRSA PCR Screening     Status: None   Collection Time: 11/05/20  1:54 PM   Specimen: Nasopharyngeal  Result Value Ref Range Status   MRSA by PCR NEGATIVE NEGATIVE Final    Comment:        The GeneXpert MRSA Assay (FDA approved for NASAL specimens only), is one component of a comprehensive MRSA colonization surveillance program. It is not intended to diagnose MRSA infection nor to guide or monitor treatment for MRSA infections. Performed at [facility], [place] [address] [place], [place] [address]      Time coordinating discharge: 35  minutes The [place] controlled substances registry was reviewed for this patient prior to filling the <5 days supply controlled substances script.    30 Day Unplanned Readmission Risk Score    Flowsheet Row ED to Hosp-Admission (Current) from 11/05/2020 in [place] (1C)  30 Day Unplanned Readmission Risk Score (%) 25.52 Filed at 11/09/2020 1200       This score is the patient's risk of an unplanned readmission within 30 days of being discharged (0 -100%). The score is based on dignosis, age, lab data, medications, orders, and past utilization.   Low:  0-14.9   Medium: 15-21.9   High: 22-29.9   Extreme: 30 and above              SIGNED:   Edwin Dada, MD  [facility] 11/09/2020, 1:33 PM

## 2020-11-09 NOTE — NC FL2 (Deleted)
[place] LEVEL OF CARE SCREENING TOOL     IDENTIFICATION  Patient Name: Craig Wilson Birthdate: Aug 08, 1940 Sex: male Admission Date (Current Location): 11/05/2020  [facility] and [place] Number:  Engineering geologist and Address:  [facility], [address] [place], [place] [address]      Provider Number: [id number]  Attending Physician Name and Address:  Edwin Dada, *  Relative Name and Phone Number:       Current Level of Care: Hospital Recommended Level of Care: [place] Prior Approval Number:    Date Approved/Denied:   PASRR Number: Manual review  Discharge Plan: SNF    Current Diagnoses: Patient Active Problem List   Diagnosis Date Noted   Acute respiratory failure with hypoxia and hypercapnia (HCC)    Altered mental status    Sepsis due to pneumonia ([place]) 11/05/2020   Acute kidney injury ([place]) 10/31/2020   Bladder outlet obstruction 10/31/2020   Hyponatremia 10/31/2020   Acute cystitis without hematuria 10/31/2020   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 04/27/2020   OSA (obstructive sleep apnea) 11/03/2019   RBBB (right bundle branch block) 11/03/2019   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   Other emphysema ([place]) 06/27/2017   Acquired trigger finger 04/10/2017   Elevated coronary artery calcium score 03/04/2017   Sepsis ([place]) 07/01/2016   CAP (community acquired pneumonia) 07/01/2016   Influenza A 07/01/2016   DDD (degenerative disc disease), lumbar 03/11/2016   Senile purpura ([place]) 03/11/2016   Spiradenoma 12/01/2015   Mass of left side of neck 10/19/2015   Thrombocytopenia ([place]) 09/07/2015   Bilateral edema of lower extremity 05/10/2015   Bipolar disorder, in partial remission, most recent episode depressed ([place]) 05/10/2015   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[place]) 05/10/2015   Edema of lower extremity 05/10/2015   RLS (restless legs syndrome) 03/29/2015   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/2015   Anemia 02/24/2015   Affective bipolar disorder ([place]) 02/24/2015   Chronic diarrhea 02/24/2015   Diabetic neuropathy ([place]) 02/24/2015   Arthritis, degenerative 02/24/2015   Arthritis urica 02/24/2015   BP (high blood pressure) 02/24/2015   CA of prostate ([place]) 02/24/2015   Incontinence 02/01/2015   History of prostate cancer 02/01/2015   Gross hematuria 02/01/2015   SOB (shortness of breath) 12/16/2014   Pain in the chest 12/16/2014   Family history of premature CAD 12/16/2014   Parkinson's disease ([place]) 12/16/2014   Essential hypertension 12/16/2014   Unsteady gait 12/16/2014   Hyperlipidemia 12/16/2014   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   Difficulty in walking 10/10/2014   Difficulty sleeping 10/10/2014   Breath shortness 05/06/2014   Amnesia 02/28/2014   Type 2 diabetes mellitus without complication, without long-term current use of insulin ([place]) 02/15/2014   Has a tremor 02/01/2006   Tremor 02/01/2006    Orientation RESPIRATION BLADDER Height & Weight     Self, Time, Situation, Place  Normal Incontinent, External catheter Weight: 67.7 kg Height:  _0  (172.7 cm)  BEHAVIORAL SYMPTOMS/MOOD NEUROLOGICAL BOWEL NUTRITION STATUS   (None)  ([place]) Incontinent Diet (Full liquid)  AMBULATORY STATUS COMMUNICATION OF NEEDS Skin   Limited Assist Verbally Skin abrasions, Bruising                       Personal Care Assistance Level of Assistance  Bathing, Feeding, Dressing Bathing Assistance: Limited assistance Feeding assistance: Independent Dressing Assistance: Limited assistance     Functional Limitations Info  Sight, Hearing, Speech Sight Info:  Adequate Hearing Info: Adequate Speech Info: Adequate    SPECIAL CARE FACTORS FREQUENCY  PT (By licensed PT), OT (By licensed OT)     PT Frequency: 5 x week OT Frequency: 5 x week            Contractures Contractures Info: Not present    Additional Factors Info  Code  Status, Allergies, Psychotropic Code Status Info: Full code Allergies Info: Carbidopa W-levodopa, Latex, Solifenacin Psychotropic Info: Bipolar         Current Medications (11/09/2020):  This is the current hospital active medication list Current Facility-Administered Medications  Medication Dose Route Frequency Provider Last Rate Last Admin   0.9 %  sodium chloride infusion  250 mL Intravenous Continuous Flora Lipps, MD   Stopped at 11/09/20 0558   Ampicillin-Sulbactam (UNASYN) 3 g in sodium chloride 0.9 % 100 mL IVPB  3 g Intravenous Q6H Flora Lipps, MD 200 mL/hr at 11/09/20 0954 3 g at 11/09/20 0954   atorvastatin (LIPITOR) tablet 40 mg  40 mg Oral QPM Flora Lipps, MD   40 mg at 11/08/20 1832   budesonide (PULMICORT) nebulizer solution 0.25 mg  0.25 mg Nebulization BID Lang Snow, NP   0.25 mg at 11/09/20 5400   carbidopa-levodopa (SINEMET IR) 25-100 MG per tablet immediate release 1 tablet  1 tablet Oral QID Flora Lipps, MD   1 tablet at 11/09/20 8676   chlorhexidine gluconate (MEDLINE KIT) (PERIDEX) 0.12 % solution 15 mL  15 mL Mouth Rinse BID Flora Lipps, MD   15 mL at 11/09/20 1950   Chlorhexidine Gluconate Cloth 2 % PADS 6 each  6 each Topical [id number] Flora Lipps, MD   6 each at 11/07/20 0436   colchicine tablet 1.2 mg  1.2 mg Oral Once Danford, Suann Larry, MD       Followed by   colchicine tablet 0.6 mg  0.6 mg Oral Once Danford, Suann Larry, MD       docusate (COLACE) 50 MG/5ML liquid 100 mg  100 mg Per Tube BID PRN Flora Lipps, MD       docusate sodium (COLACE) capsule 100 mg  100 mg Oral BID Lockie Mola B, RPH   100 mg at 11/09/20 0938   fluticasone (FLONASE) 50 MCG/ACT nasal spray 2 spray  2 spray Each Nare Daily Wendee Beavers T, MD   2 spray at 11/09/20 0939   heparin injection 5,000 Units  5,000 Units Subcutaneous Q8H Rust-Chester, Britton L, NP   5,000 Units at 11/09/20 0443   insulin aspart (novoLOG) injection 0-15 Units  0-15 Units Subcutaneous Q4H  Lucrezia Starch, MD   8 Units at 11/09/20 1226   ipratropium-albuterol (DUONEB) 0.5-2.5 (3) MG/3ML nebulizer solution 3 mL  3 mL Nebulization Q6H PRN Lang Snow, NP       lithium carbonate capsule 300 mg  300 mg Oral BID Flora Lipps, MD   300 mg at 11/09/20 0938   loratadine (CLARITIN) tablet 10 mg  10 mg Oral Daily Flora Lipps, MD   10 mg at 11/09/20 1245   MEDLINE mouth rinse  15 mL Mouth Rinse q12n4p Flora Lipps, MD   15 mL at 11/09/20 1231   pantoprazole (PROTONIX) EC tablet 40 mg  40 mg Oral Daily Lockie Mola B, RPH   40 mg at 11/09/20 8099   polyethylene glycol (MIRALAX / GLYCOLAX) packet 17 g  17 g Per Tube Daily PRN Flora Lipps, MD       polyvinyl alcohol (LIQUIFILM TEARS)  1.4 % ophthalmic solution 1 drop  1 drop Both Eyes PRN Flora Lipps, MD   1 drop at 11/06/20 1736   rOPINIRole (REQUIP) tablet 2 mg  2 mg Oral QHS Flora Lipps, MD   2 mg at 11/08/20 2128   sodium chloride (OCEAN) 0.65 % nasal spray 1 spray  1 spray Each Nare PRN Mercy Riding, MD       traZODone (DESYREL) tablet 50-100 mg  50-100 mg Oral QHS Flora Lipps, MD   100 mg at 11/08/20 2128     Discharge Medications: Please see discharge summary for a list of discharge medications.  Relevant Imaging Results:  Relevant Lab Results:   Additional Information SS#: [id number]. Fully vaccinated. Pfizer: 07/29/19, 08/25/19, 02/29/20.  Shelbie Hutching, RN

## 2020-11-09 NOTE — Progress Notes (Signed)
To whom it may concern:  Please be advised that the above named patient will require a short term nursing home stay - anticipated 30 days or less for rehabilitation and strengthening.  The plan is for return home.

## 2020-11-09 NOTE — Care Management Important Message (Signed)
Important Message  Patient Details  Name: Craig Wilson MRN: [id number] Date of Birth: January 20, 1941   Medicare Important Message Given:  Yes     Juliann Pulse A Brasen Bundren 11/09/2020, 2:25 PM

## 2020-11-09 NOTE — TOC Transition Note (Signed)
Transition of Care [facility]) - CM/SW Discharge Note   Patient Details  Name: LIBERATO STANSBERY MRN: [id number] Date of Birth: [birth year]  [facility] CM/SW Contact:  Shelbie Hutching, RN Phone Number: 11/09/2020, 1:28 PM   Clinical Narrative:    [facility] was unable to offer a bed for SNF.  Patient still wants to go to SNF and chooses [facility] in [place] [id number].  Patient will go to room 611B today.  Bedside RN will call report.  Patient medically cleared for discharge today.  [facility] will transport patient to facility.  Once COVID test returns RNCM will arrange transport.  Patient's family is aware of discharge today.  Insurance authorization has been approved good starting today through 6/13.     Final next level of care: Skilled Nursing Facility Barriers to Discharge: Barriers Resolved   Patient Goals and CMS Choice Patient states their goals for this hospitalization and ongoing recovery are:: does not want to go to SNF but will go if it will help him get home CMS Medicare.gov Compare Post Acute Care list provided to:: Patient Choice offered to / list presented to : Patient  Discharge Placement PASRR number recieved: 11/09/20            Patient chooses bed at: [facility] [place] Patient to be transferred to facility by: [facility] Name of family member notified: [name] (wife) Patient and family notified of of transfer: 11/09/20  Discharge Plan and Services   Discharge Planning Services: CM Consult Post Acute Care Choice: [place]          DME Arranged: N/A DME Agency: NA       HH Arranged: NA          Social Determinants of Health (SDOH) Interventions     Readmission Risk Interventions No flowsheet data found.

## 2020-11-09 NOTE — Plan of Care (Signed)
Pt orientedx4, VSS, NSR on telemetry. Episode hypoglycemia to 59 at midnight, resolved with juice up to 144, asymptomatic during episode. Adequate UO. X2 BM overnight. Fall/safety precautions in place, rounding performed. Starting to c/o pain in left great toe, similar to gout pain in the past, will pass on to day RN for MD.   Problem: Education: Goal: Knowledge of General Education information will improve Description: Including pain rating scale, medication(s)/side effects and non-pharmacologic comfort measures Outcome: Progressing   Problem: Health Behavior/Discharge Planning: Goal: Ability to manage health-related needs will improve Outcome: Progressing   Problem: Clinical Measurements: Goal: Ability to maintain clinical measurements within normal limits will improve Outcome: Progressing Goal: Will remain free from infection Outcome: Progressing Goal: Diagnostic test results will improve Outcome: Progressing Goal: Respiratory complications will improve Outcome: Progressing Goal: Cardiovascular complication will be avoided Outcome: Progressing   Problem: Activity: Goal: Risk for activity intolerance will decrease Outcome: Progressing   Problem: Nutrition: Goal: Adequate nutrition will be maintained Outcome: Progressing   Problem: Coping: Goal: Level of anxiety will decrease Outcome: Progressing   Problem: Elimination: Goal: Will not experience complications related to bowel motility Outcome: Progressing Goal: Will not experience complications related to urinary retention Outcome: Progressing   Problem: Pain Managment: Goal: General experience of comfort will improve Outcome: Progressing   Problem: Safety: Goal: Ability to remain free from injury will improve Outcome: Progressing   Problem: Skin Integrity: Goal: Risk for impaired skin integrity will decrease Outcome: Progressing

## 2020-11-11 LAB — CULTURE, BLOOD (SINGLE)
Culture: NO GROWTH
Special Requests: ADEQUATE

## 2020-11-27 ENCOUNTER — Ambulatory Visit: Payer: Medicare Other | Admitting: Urology

## 2020-12-11 ENCOUNTER — Other Ambulatory Visit: Payer: Self-pay

## 2020-12-11 ENCOUNTER — Encounter: Payer: Self-pay | Admitting: Urology

## 2020-12-11 ENCOUNTER — Ambulatory Visit (INDEPENDENT_AMBULATORY_CARE_PROVIDER_SITE_OTHER): Payer: Medicare Other | Admitting: Urology

## 2020-12-11 VITALS — BP 153/63 | HR 64 | Ht 68.0 in | Wt 143.0 lb

## 2020-12-11 DIAGNOSIS — N3946 Mixed incontinence: Secondary | ICD-10-CM | POA: Diagnosis not present

## 2020-12-11 NOTE — Progress Notes (Signed)
12/11/2020 9:22 AM   Craig Wilson 1940-09-22 [id number]  Referring provider: Adin Hector, MD [facility] [place],  [place] [address]  Chief Complaint  Patient presents with   Other    HPI: On June 21, 2020 patient saw Dr. Terance Hart at [facility] with an artificial sphincter.  He went over removal and replacement of the device.  He has had a past history of retention.  He has Parkinson's.  A note from March 24 noted the patient had replacement of his artificial sphincter 6 weeks prior.  He has a past surgical retention with 900 cc in his bladder.  He has had a 1 L retention episode as well.  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  He has had a retropubic radical prostatectomy and radiation.  She had a 4 cm cuff placed in 2006.  He has had a mild bladder neck contracture bluntly dilated I believe with a cystoscope in 2021.  Activation of new pump was delayed due to some swelling of the scrotum.  He was activated I believe Oct 25, 2020.   Patient reports that within 2 to 3 days of him having the device activated he passed out and went to a local hospital for nearly a month.  It appears he may have had a catheter for a week.  The device was not deactivated.  He is leaking by gravity.  He soaking many pads a day and does not void spontaneously except a little bit at night.  He is not pumping the device.  No burning.  No blood in urine.   He does not take daily aspirin or blood thinners   PMH: Past Medical History:  Diagnosis Date   Anemia    Bipolar disorder (HCC)    Chronic diarrhea    Colon polyps    Depression    Dermatophytosis    Diabetic neuropathy (HCC)    DJD (degenerative joint disease)    Dyspnea    Gout    Gross hematuria    HLD (hyperlipidemia)    HTN (hypertension)    Hyperkeratosis    Onychomycosis    Parkinson disease ([place])    Prostate cancer ([place])    Sleep apnea    Tremor    Type 2 diabetes mellitus ([place])      Surgical History: Past Surgical History:  Procedure Laterality Date   cataract surgery Bilateral    COLONOSCOPY W/ BIOPSIES     FOOT SURGERY     PROSTATE SURGERY     removed   status post implantation of artificial urinary sphincter      TONSILLECTOMY      Home Medications:  Allergies as of 12/11/2020       Reactions   Carbidopa W-levodopa Other (See Comments)   irrational behavior , felt zombie like   Carbidopa-levodopa Other (See Comments)   irrational behavior , felt zombie like   Latex Other (See Comments)   Internal such as catheters. Latex gloves are okay   Solifenacin Other (See Comments)        Medication List        Accurate as of December 11, 2020  9:22 AM. If you have any questions, ask your nurse or doctor.          atorvastatin 40 MG tablet Commonly known as: LIPITOR Take 40 mg by mouth every evening.   benzonatate 200 MG capsule Commonly known as: TESSALON Take 1 capsule by  mouth 3 (three) times daily.   carbidopa-levodopa 25-100 MG tablet Commonly known as: SINEMET IR Take 1 tablet by mouth 4 (four) times daily.   chlorpheniramine-HYDROcodone 10-8 MG/5ML Suer Commonly known as: TUSSIONEX Take 5 mLs by mouth every 12 (twelve) hours.   docusate sodium 100 MG capsule Commonly known as: COLACE Take 1 capsule (100 mg total) by mouth 2 (two) times daily.   lithium carbonate 300 MG capsule Take 300 mg by mouth 2 (two) times daily.   polyethylene glycol 17 g packet Commonly known as: MIRALAX / GLYCOLAX Take 17 g by mouth daily as needed for moderate constipation.   rOPINIRole 2 MG tablet Commonly known as: REQUIP Take 2 mg by mouth at bedtime.   traZODone 50 MG tablet Commonly known as: DESYREL Take 50-100 mg by mouth at bedtime.        Allergies:  Allergies  Allergen Reactions   Carbidopa W-Levodopa Other (See Comments)    irrational behavior , felt zombie like   Carbidopa-Levodopa Other (See Comments)    irrational behavior ,  felt zombie like   Latex Other (See Comments)    Internal such as catheters. Latex gloves are okay   Solifenacin Other (See Comments)    Family History: Family History  Problem Relation Age of Onset   Heart disease Mother    Heart attack Father 33   Hypertension Father    Hyperlipidemia Father    Kidney cancer Neg Hx    Prostate cancer Neg Hx    Bladder Cancer Neg Hx     Social History:  reports that he quit smoking about 21 years ago. His smoking use included cigarettes. He has never used smokeless tobacco. He reports that he does not drink alcohol and does not use drugs.  ROS:                                        Physical Exam: BP (!) 153/63   Pulse 64   Ht 5\' 8"  (1.727 m)   Wt 64.9 kg   BMI 21.74 kg/m   Constitutional:  Alert and oriented, No acute distress. HEENT: [place] AT, moist mucus membranes.  Trachea midline, no masses. Cardiovascular: No clubbing, cyanosis, or edema. Respiratory: Normal respiratory effort, no increased work of breathing. GI: Abdomen is soft, nontender, nondistended, no abdominal masses GU: On physical examination patient had a pump nontender in the right hemiscrotum.  It was deflated.  I could not fill it by distorting the pump.  There is no blood at the meatus.  There is no tenderness of cough.  He was leaking with gravity standing Skin: No rashes, bruises or suspicious lesions. Lymph: No cervical or inguinal adenopathy. Neurologic: Grossly intact, no focal deficits, moving all 4 extremities. Psychiatric: Normal mood and affect.  Laboratory Data: Lab Results  Component Value Date   WBC 11.9 (H) 11/08/2020   HGB 10.4 (L) 11/08/2020   HCT 32.5 (L) 11/08/2020   MCV 103.5 (H) 11/08/2020   PLT 154 11/08/2020    Lab Results  Component Value Date   CREATININE 1.03 11/08/2020    Lab Results  Component Value Date   PSA  09/20/2010    ========== TEST NAME ==========  ========= RESULTS =========  = REFERENCE RANGE  =  PROSTATE-SPECIFIC AG.  PSA, Serum (Serial Monitor) Prostate Specific Ag, Serum     [   <0.1 ng/mL           ]  0.0-4.0 Roche ECLIA methodology.                                                                      . According to the American Urological Association, Serum PSA should decrease and remain at undetectable levels after radical prostatectomy. The AUA defines biochemical recurrence as an initial PSA value 0.2 ng/mL or greater followed by a subsequent confirmatory PSA value 0.2 ng/mL or greater. Values obtained with different assay methods or kits cannot be used interchangeably. Results cannot be interpreted as absolute evidence of the presence or absence of malignant disease.               [facility]            No: [id number]           [address], [place], [place] [address]           Lindon Romp, MD         [phone]   Result(s) reported on 21 Sep 2010 at 12:50PM.     No results found for: TESTOSTERONE  Lab Results  Component Value Date   HGBA1C 5.6 10/31/2020    Urinalysis    Component Value Date/Time   COLORURINE YELLOW (A) 11/05/2020 1021   APPEARANCEUR CLEAR (A) 11/05/2020 1021   APPEARANCEUR Clear 03/11/2019 1354   LABSPEC 1.014 11/05/2020 1021   PHURINE 6.0 11/05/2020 1021   GLUCOSEU 50 (A) 11/05/2020 1021   HGBUR NEGATIVE 11/05/2020 1021   BILIRUBINUR NEGATIVE 11/05/2020 1021   BILIRUBINUR Negative 03/11/2019 1354   KETONESUR NEGATIVE 11/05/2020 1021   PROTEINUR 30 (A) 11/05/2020 1021   UROBILINOGEN 0.2 03/23/2010 1424   NITRITE NEGATIVE 11/05/2020 1021   LEUKOCYTESUR NEGATIVE 11/05/2020 1021    Pertinent Imaging: Chart reviewed.  No urine obtained  Assessment & Plan: I spoke to the patient and I have recommended for him to call Dr. Terance Hart today who I am sure will work him in for reassessment of his artificial urinary sphincter.  He is in the early postoperative phase and this would give him the best  continuity of care.  The patient and I spoke and he knows that clinically he is not infected but he could have an erosion.  He understands for continuity of care and quality and efficiency he will call Dr. Elam Dutch office today and I am sure they will get him in soon since he is in the postoperative course.  I do not mind following him long-term.  He understood the differential diagnosis.  He does not mind being totally incontinent if that is his end result.  He understands that if the device needs to be removed it should be done by the surgeon who placed it approximately 3 months ago.    There are no diagnoses linked to this encounter.  No follow-ups on file.  Reece Packer, MD  [facility] [address] [place], [place] [address] [phone]

## 2020-12-19 ENCOUNTER — Encounter: Payer: Self-pay | Admitting: Cardiovascular Disease

## 2020-12-19 ENCOUNTER — Ambulatory Visit (INDEPENDENT_AMBULATORY_CARE_PROVIDER_SITE_OTHER): Payer: Medicare Other | Admitting: Cardiovascular Disease

## 2020-12-19 ENCOUNTER — Other Ambulatory Visit: Payer: Self-pay

## 2020-12-19 VITALS — BP 130/60 | HR 66 | Ht 68.0 in | Wt 144.4 lb

## 2020-12-19 DIAGNOSIS — G20A1 Parkinson's disease without dyskinesia, without mention of fluctuations: Secondary | ICD-10-CM

## 2020-12-19 DIAGNOSIS — I10 Essential (primary) hypertension: Secondary | ICD-10-CM

## 2020-12-19 DIAGNOSIS — G20 Parkinson's disease: Secondary | ICD-10-CM

## 2020-12-19 DIAGNOSIS — R931 Abnormal findings on diagnostic imaging of heart and coronary circulation: Secondary | ICD-10-CM | POA: Diagnosis not present

## 2020-12-19 DIAGNOSIS — E118 Type 2 diabetes mellitus with unspecified complications: Secondary | ICD-10-CM

## 2020-12-19 DIAGNOSIS — R0602 Shortness of breath: Secondary | ICD-10-CM

## 2020-12-19 DIAGNOSIS — E782 Mixed hyperlipidemia: Secondary | ICD-10-CM

## 2020-12-19 MED ORDER — ATORVASTATIN CALCIUM 40 MG PO TABS: 40.0000 mg | ORAL_TABLET | Freq: Every evening | ORAL | 3 refills | Status: DC

## 2020-12-19 NOTE — Patient Instructions (Addendum)
Medication Instructions:  No changes  If you need a refill on your cardiac medications before your next appointment, please call your pharmacy.   Lab work: No new labs needed  Testing/Procedures: No new testing needed  Follow-Up: At [facility], you and your health needs are our priority.  As part of our continuing mission to provide you with exceptional heart care, we have created designated Provider Care Teams.  These Care Teams include your primary Cardiologist (physician) and Advanced Practice Providers (APPs -  Physician Assistants and Nurse Practitioners) who all work together to provide you with the care you need, when you need it.  You will need a follow up appointment in 12 months  Providers on your designated Care Team:   Christopher Berge, NP Ryan Dunn, PA-C Jacquelyn Visser, PA-C Cadence Furth, PA-C  COVID-19 Vaccine Information can be found at: [email or web address] For questions related to vaccine distribution or appointments, please email [email or web address] or call [phone].    

## 2020-12-19 NOTE — Progress Notes (Signed)
Cardiology Office Note  Date:  12/19/2020   ID:  Craig Wilson, Craig Wilson 12/16/1940, MRN [id number]  PCP:  Adin Hector, MD   Chief Complaint  Patient presents with   Other    12 month f/u c/o elevated HR and sob. Meds reviewed verbally with pt.    HPI:  Craig Wilson is a very pleasant 80 year old gentleman with history of  Parkinson's,  hypertension,  diabetes,  prostate cancer with prior radiation treatment,  hyperlipidemia  CT coronary calcium score score of 7 who presents for evaluation of shortness of breath, coronary artery disease, hypertension.  Last seen in the clinic by myself August 2020  In hospiatl may and June 2022:  Septic shock secondary to pneumonia Acute hypoxic and hypercapnic respiratory failure due to pneumonia sepsis Acute metabolic encephalopathy due to sepsis Patient presented with tachypnea, somnolence, hypoxic respiratory failure in the setting of pneumonia with septic shock.   admitted to ICU, intubated, placed on Levophed and antibiotics.   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   sphincter had been activated 5/25, deactivated 5/31 due to AKI and urinary obstruction.  During this hospitalization, the patient had a Foley catheter for several days  Creatinine resolved to normal, new AKI ruled out.   PNA, Spent 3 weeks at rehab  At the time of discharge ,off losartan after weight loss in hospital  Acute on Chronic SOB, Seen by pulmonary, on Symbicort  Previous CT coronary calcium  score of 7  Denies any leg swelling Prior history of chronic diarrhea  Lab work reviewed otal chol 141, LDL 68 HBa1C 6.3  EKG personally reviewed by myself on todays visit Shows normal sinus rhythm rate 66 bpm,  right bundle branch block   Other past medical history reviewed  strong family history. Father had MI in his 79s, mother had stroke in her 20s Brother has cancer    PMH:   has a past medical history of Anemia, Bipolar disorder ([place]), Chronic diarrhea, Colon polyps, Depression, Dermatophytosis, Diabetic neuropathy ([place]), DJD (degenerative joint disease), Dyspnea, Gout, Gross hematuria, HLD (hyperlipidemia), HTN (hypertension), Hyperkeratosis, Onychomycosis, Parkinson disease ([place]), Prostate cancer ([place]), Sleep apnea, Tremor, and Type 2 diabetes mellitus ([place]).  PSH:    Past Surgical History:  Procedure Laterality Date   cataract surgery Bilateral    COLONOSCOPY W/ BIOPSIES     FOOT SURGERY     PROSTATE SURGERY     removed   status post implantation of artificial urinary sphincter      TONSILLECTOMY      Current Outpatient Medications  Medication Sig Dispense Refill   atorvastatin (LIPITOR) 40 MG tablet Take 40 mg by mouth every evening.     carbidopa-levodopa (SINEMET IR) 25-100 MG tablet Take 1 tablet by mouth 4 (four) times daily.     lithium carbonate 300 MG capsule Take 300 mg by mouth 2 (two) times daily.     polyethylene glycol (MIRALAX / GLYCOLAX) 17 g packet Take 17 g by mouth daily as needed for moderate constipation. 14 each 0   rOPINIRole (REQUIP) 2 MG tablet Take 2 mg by mouth at bedtime.  5   traZODone (DESYREL) 50 MG tablet Take 50-100 mg by mouth at bedtime.     benzonatate (TESSALON) 200 MG capsule Take 1  capsule by mouth 3 (three) times daily. (Patient not taking: Reported on 12/19/2020)     chlorpheniramine-HYDROcodone (TUSSIONEX) 10-8 MG/5ML SUER Take 5 mLs by mouth every 12 (twelve) hours. (Patient not taking: No sig reported) 30 mL 0   docusate sodium (COLACE) 100 MG capsule Take 1 capsule (100 mg total) by mouth 2 (two) times daily. (Patient not taking: No sig reported) 10 capsule 0   No current facility-administered medications for this visit.    Allergies:    Carbidopa w-levodopa, Carbidopa-levodopa, Latex, and Solifenacin   Social History:  The patient  reports that he quit smoking about 21 years ago. His smoking use included cigarettes. He has never used smokeless tobacco. He reports that he does not drink alcohol and does not use drugs.   Family History:   family history includes Heart attack (age of onset: 80) in his father; Heart disease in his mother; Hyperlipidemia in his father; Hypertension in his father.    Review of Systems: Review of Systems  Constitutional: Negative.   Respiratory: Negative.    Cardiovascular: Negative.   Gastrointestinal: Negative.   Musculoskeletal: Negative.        Unsteady gait  Neurological: Negative.   Psychiatric/Behavioral: Negative.    All other systems reviewed and are negative.   PHYSICAL EXAM: VS:  BP 130/60 (BP Location: Left Arm, Patient Position: Sitting, Cuff Size: Normal)   Pulse 66   Ht 5\' 8"  (1.727 m)   Wt 144 lb 6 oz (65.5 kg)   SpO2 98%   BMI 21.95 kg/m  , BMI Body mass index is 21.95 kg/m. Constitutional:  oriented to person, place, and time. No distress.  HENT:  Head: Grossly normal Eyes:  no discharge. No scleral icterus.  Neck: No JVD, no carotid bruits  Cardiovascular: Regular rate and rhythm, no murmurs appreciated Pulmonary/Chest: Clear to auscultation bilaterally, no wheezes or rails Abdominal: Soft.  no distension.  no tenderness.  Musculoskeletal: Normal range of motion Neurological:  normal muscle tone. Coordination normal. No atrophy Skin: Skin warm and dry Psychiatric: normal affect, pleasant  Recent Labs: 11/05/2020: ALT 12 11/07/2020: Magnesium 2.0 11/08/2020: BUN 22; Creatinine, Ser 1.03; Hemoglobin 10.4; Platelets 154; Potassium 4.3; Sodium 142    Lipid Panel Lab Results  Component Value Date   TRIG 104 11/06/2020      Wt Readings from Last 3 Encounters:  12/19/20 144 lb 6 oz (65.5 kg)  12/11/20 143 lb (64.9 kg)  11/09/20 149 lb 4 oz (67.7 kg)      ASSESSMENT AND PLAN:  Essential hypertension  Off losartan , weight loss contributing to lower blood pressure  Mixed hyperlipidemia Minimal underlying coronary calcifications seen on CT scan Cholesterol is at goal on the current lipid regimen. No changes to the medications were made.  SOB (shortness of breath) Recent PNA, long recovery, intubated during his hospital course Still recovering  Parkinson's disease ([place]) Recommended walking program Followed by neurology  Type 2 diabetes mellitus with complication, without long-term current use of insulin (HCC) Hemoglobin A1c stable 5.6, weight down  Anemia, unspecified type Stable, HGB 10.4   Total encounter time more than 25 minutes  Greater than 50% was spent in counseling and coordination of care with the patient    Orders Placed This Encounter  Procedures   EKG 12-Lead      Signed, Esmond Plants, M.D., Ph.D. 12/19/2020  [place], [place]

## 2021-05-06 ENCOUNTER — Encounter: Payer: Self-pay | Admitting: Emergency Medicine

## 2021-05-06 ENCOUNTER — Emergency Department
Admission: EM | Admit: 2021-05-06 | Discharge: 2021-05-06 | Disposition: A | Discharge status: Home / Self Care | Payer: Medicare Other | Attending: Emergency Medicine | Admitting: Emergency Medicine

## 2021-05-06 ENCOUNTER — Emergency Department: Payer: Medicare Other

## 2021-05-06 ENCOUNTER — Other Ambulatory Visit: Payer: Self-pay

## 2021-05-06 DIAGNOSIS — Z5321 Procedure and treatment not carried out due to patient leaving prior to being seen by health care provider: Secondary | ICD-10-CM | POA: Diagnosis not present

## 2021-05-06 DIAGNOSIS — R52 Pain, unspecified: Secondary | ICD-10-CM

## 2021-05-06 DIAGNOSIS — W19XXXA Unspecified fall, initial encounter: Secondary | ICD-10-CM | POA: Diagnosis not present

## 2021-05-06 DIAGNOSIS — S0990XA Unspecified injury of head, initial encounter: Secondary | ICD-10-CM | POA: Insufficient documentation

## 2021-05-06 LAB — BASIC METABOLIC PANEL
Anion gap: 7 (ref 5–15)
BUN: 15 mg/dL (ref 8–23)
CO2: 26 mmol/L (ref 22–32)
Calcium: 10 mg/dL (ref 8.9–10.3)
Chloride: 99 mmol/L (ref 98–111)
Creatinine, Ser: 1 mg/dL (ref 0.61–1.24)
GFR, Estimated: >60 mL/min (ref >60)
Glucose, Bld: 123 mg/dL — ABNORMAL HIGH (ref 70–99)
Potassium: 4.7 mmol/L (ref 3.5–5.1)
Sodium: 132 mmol/L — ABNORMAL LOW (ref 135–145)

## 2021-05-06 LAB — CBC
HCT: 41.5 % (ref 39.0–52.0)
Hemoglobin: 13.3 g/dL (ref 13.0–17.0)
MCH: 32.5 pg (ref 26.0–34.0)
MCHC: 32 g/dL (ref 30.0–36.0)
MCV: 101.5 fL — ABNORMAL HIGH (ref 80.0–100.0)
Platelets: 117 10*3/uL — ABNORMAL LOW (ref 150–400)
RBC: 4.09 MIL/uL — ABNORMAL LOW (ref 4.22–5.81)
RDW: 12 % (ref 11.5–15.5)
WBC: 10.7 10*3/uL — ABNORMAL HIGH (ref 4.0–10.5)
nRBC: 0 % (ref 0.0–0.2)

## 2021-05-06 MED ORDER — ACETAMINOPHEN 325 MG PO TABS
650.0000 mg | ORAL_TABLET | Freq: Once | ORAL | Status: AC
  Administered 2021-05-06: 15:00:00 650 mg via ORAL
  Filled 2021-05-06: qty 2

## 2021-05-06 NOTE — ED Notes (Signed)
Pt wife reports they are leaving and will call Dr. Caryl Comes tomorrow to get results and follow up

## 2021-05-06 NOTE — ED Triage Notes (Signed)
Pt via POV from home. Pt had a fall a couple of days ago, cannot remember why he fell. Denies LOC. Pt does endorse head injury. Unknown if pt is on blood thinners, pt states "possibly". Pt also thinks he may have PNA. Pt has nasal congestion, SOB, and cough. Pt also c/o R hip pain. Pt is A&Ox4 and NAD

## 2021-06-14 ENCOUNTER — Emergency Department: Payer: Medicare Other

## 2021-06-14 ENCOUNTER — Emergency Department
Admission: EM | Admit: 2021-06-14 | Discharge: 2021-06-14 | Disposition: A | Discharge status: Home / Self Care | Payer: Medicare Other | Attending: Emergency Medicine | Admitting: Emergency Medicine

## 2021-06-14 ENCOUNTER — Other Ambulatory Visit: Payer: Self-pay

## 2021-06-14 DIAGNOSIS — I10 Essential (primary) hypertension: Secondary | ICD-10-CM | POA: Insufficient documentation

## 2021-06-14 DIAGNOSIS — W108XXA Fall (on) (from) other stairs and steps, initial encounter: Secondary | ICD-10-CM | POA: Diagnosis not present

## 2021-06-14 DIAGNOSIS — E119 Type 2 diabetes mellitus without complications: Secondary | ICD-10-CM | POA: Diagnosis not present

## 2021-06-14 DIAGNOSIS — S92334A Nondisplaced fracture of third metatarsal bone, right foot, initial encounter for closed fracture: Secondary | ICD-10-CM | POA: Diagnosis not present

## 2021-06-14 DIAGNOSIS — S50311A Abrasion of right elbow, initial encounter: Secondary | ICD-10-CM | POA: Diagnosis not present

## 2021-06-14 DIAGNOSIS — S92324A Nondisplaced fracture of second metatarsal bone, right foot, initial encounter for closed fracture: Secondary | ICD-10-CM | POA: Insufficient documentation

## 2021-06-14 DIAGNOSIS — Z8546 Personal history of malignant neoplasm of prostate: Secondary | ICD-10-CM | POA: Insufficient documentation

## 2021-06-14 DIAGNOSIS — Y9301 Activity, walking, marching and hiking: Secondary | ICD-10-CM | POA: Diagnosis not present

## 2021-06-14 DIAGNOSIS — S82401A Unspecified fracture of shaft of right fibula, initial encounter for closed fracture: Secondary | ICD-10-CM | POA: Diagnosis not present

## 2021-06-14 DIAGNOSIS — S82891A Other fracture of right lower leg, initial encounter for closed fracture: Secondary | ICD-10-CM

## 2021-06-14 DIAGNOSIS — S92301A Fracture of unspecified metatarsal bone(s), right foot, initial encounter for closed fracture: Secondary | ICD-10-CM

## 2021-06-14 DIAGNOSIS — G20 Parkinson's disease: Secondary | ICD-10-CM | POA: Insufficient documentation

## 2021-06-14 DIAGNOSIS — S99921A Unspecified injury of right foot, initial encounter: Secondary | ICD-10-CM | POA: Diagnosis present

## 2021-06-14 DIAGNOSIS — W19XXXA Unspecified fall, initial encounter: Secondary | ICD-10-CM

## 2021-06-14 MED ORDER — HYDROCODONE-ACETAMINOPHEN 5-325 MG PO TABS
1.0000 | ORAL_TABLET | Freq: Once | ORAL | Status: AC
  Administered 2021-06-14: 1 via ORAL
  Filled 2021-06-14: qty 1

## 2021-06-14 MED ORDER — HYDROCODONE-ACETAMINOPHEN 5-325 MG PO TABS: 1.0000 | ORAL_TABLET | Freq: Four times a day (QID) | ORAL | 0 refills | Status: AC | PRN

## 2021-06-14 NOTE — ED Notes (Signed)
D/C, new RX, and reasons to return to ED discussed with pt. Pt educated on walker use. NAD noted.

## 2021-06-14 NOTE — ED Notes (Signed)
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

## 2021-06-14 NOTE — ED Provider Notes (Signed)
[facility] Provider Note    Event Date/Time   First MD Initiated Contact with Patient 06/14/21 1236     (approximate)   History   Fall   HPI  Craig Wilson is a 81 y.o. 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  Patient states he was walking with his walker down some steps carrying a heavy cat cage when he lost his balance fell.  States he initially had some soreness in his left hip but he no longer has any pain there and has been able to bear weight albeit with significant pain in the right foot.  He also states he scraped his elbow.  He did not hit his head denies any other acute pain including any significant pain in the right elbow.  He is not on blood thinners.  He is otherwise been in his usual state health without any other recent falls or injuries, fevers, chills, chest pain, cough, shortness of breath, abdominal pain, vomiting, diarrhea, rash or any other acute concerns at this time      Physical Exam  Triage Vital Signs: ED Triage Vitals  Enc Vitals Group     BP      Pulse      Resp      Temp      Temp src      SpO2      Weight      Height      Head Circumference      Peak Flow      Pain Score      Pain Loc      Pain Edu?      Excl. in [place]?     Most recent vital signs: Vitals:   06/14/21 1239  BP: (!) 148/54  Pulse: 71  Resp: 17  Temp: 98.9 F (37.2 C)  SpO2: 100%    General: Awake, no distress.  CV:  Good peripheral perfusion.  2+ radial and DP pulses.  Less than 2-second cap refill in all extremities.  No murmurs rubs or  gallops Resp:  Normal effort.  Clear bilaterally. Abd:  No distention.  Soft throughout. Other:  Cranial nerves are all intact.  There is no tenderness step-offs or deformities over the C/T/L-spine.  There is a small abrasion over the posterior aspect of the right elbow.  There is no effusion deformity or other significant tenderness and patient has full strength range of motion at the right elbow.  No other trauma to the left upper extremity.  He has full strength and range of motion at the bilateral hips and knees and left ankle but has some limitation on range of motion and strength at the right ankle specifically in plantar and dorsiflexion.  Toes foot and ankle are diffusely edematous and there is some tenderness over the dorsum of the foot just proximal to the toes as well as on the medial lateral aspects of the ankle around both malleoli.  No puncture wounds.  No other visible trauma to the bilateral lower extremities.   ED Results / Procedures / Treatments  Labs (all labs ordered are listed, but only  abnormal results are displayed) Labs Reviewed - No data to display   EKG    RADIOLOGY  X-ray of the right ankle shows nondisplaced fracture at the distal shaft of the metaphysis of the fibula and possible nondisplaced fracture at the margin of the anterior distal tibia.  There is also calcified density at the tip of the medial malleolus 3 representing an avulsion fracture.  No other clear acute injuries in the right ankle film.  I reviewed and interpreted this x-ray.  I also reviewed radiology's interpretation and agree with their findings.   X-ray of the right foot shows nondisplaced fractures at the base of the second and third metatarsals and some irregularity at the base of the first metatarsal possibly representing an acute fracture.  No other acute fractures or dislocations.  I reviewed interpreted this x-ray.  I also reviewed radiology interpretation and agree with their findings.    PROCEDURES:    MEDICATIONS ORDERED IN ED: Medications  HYDROcodone-acetaminophen (NORCO/VICODIN) 5-325 MG per tablet 1 tablet (1 tablet Oral Given 06/14/21 1302)     IMPRESSION / MDM / ASSESSMENT AND PLAN / ED COURSE  I reviewed the triage vital signs and the nursing notes.                              Differential diagnosis includes, but is not limited to contusion and possible ligamentous injury in the right ankle with possible underlying fracture of the distal tibia or fibula, hindfoot bones as well as midfoot and tibial bones versus contusion.  With regard to the right elbow I suspect likely an abrasion with very low suspicion at this time for underlying orthopedic injury.  All compartments including the foot and elbow are soft and neurovascular intact otherwise.  I below suspicion for compartment syndrome and there is no evidence of acute infectious process at this time.  X-ray of the right ankle shows nondisplaced fracture at the distal shaft of the metaphysis of the fibula and possible nondisplaced fracture at the margin of the anterior distal tibia.  There is also calcified density at the tip of the medial malleolus 3 representing an avulsion fracture.  No other clear acute injuries in the right ankle film.  I reviewed and interpreted this x-ray.  I also reviewed radiology's interpretation and agree with their findings.  X-ray of the right foot shows nondisplaced fractures at the base of the second and third metatarsals and some irregularity at the base of the first metatarsal possibly representing an acute fracture.  No other acute fractures or dislocations.  I reviewed interpreted this x-ray.  I also reviewed radiology interpretation and agree with their findings.   I suspect symptoms are related to these fractures which are likely acute.  Low suspicion for other significant visceral or cortical orthopedic injury at this time.  Discussed with on-call orthopedist Dr. Roland Rack who  recommended orthopedic cam boot and crutches with patient to be nonweightbearing as tolerated in the right lower extremity.  I discussed this with patient.  Boot provided.  Unfortunately patient is too unstable to use crutches at this time so we will use his walker to keep weight off is much as possible in the right lower extremity.  Rx written for analgesia.  Discussed importance of close outpatient orthopedic follow-up.  Discharged in stable condition.      FINAL CLINICAL IMPRESSION(S) / ED DIAGNOSES   Final diagnoses:  Fall, initial encounter  Abrasion of right  elbow, initial encounter  Closed fracture of right ankle, initial encounter  Closed nondisplaced fracture of metatarsal bone of right foot, unspecified metatarsal, initial encounter     Rx / DC Orders   ED Discharge Orders     None        Note:  This document was prepared using Dragon voice recognition software and may include unintentional dictation errors.   Lucrezia Starch, MD 06/14/21 1444

## 2021-06-14 NOTE — ED Triage Notes (Signed)
Pt tripped & fell last night at approx 1900, injuring R ankle/foot. Pt also reports bumping his R hip.

## 2021-06-24 ENCOUNTER — Emergency Department: Payer: Medicare Other

## 2021-06-24 ENCOUNTER — Inpatient Hospital Stay: Payer: Medicare Other

## 2021-06-24 ENCOUNTER — Encounter: Payer: Self-pay | Admitting: Emergency Medicine

## 2021-06-24 ENCOUNTER — Inpatient Hospital Stay
Admission: EM | Admit: 2021-06-24 | Discharge: 2021-06-28 | DRG: 563 | Disposition: A | Discharge status: Skilled Nursing Facility | Payer: Medicare Other | Attending: Internal Medicine | Admitting: Internal Medicine

## 2021-06-24 ENCOUNTER — Other Ambulatory Visit: Payer: Self-pay

## 2021-06-24 ENCOUNTER — Emergency Department
Admission: EM | Admit: 2021-06-24 | Discharge: 2021-06-24 | Disposition: A | Discharge status: Home / Self Care | Payer: Medicare Other | Source: Home / Self Care | Attending: Emergency Medicine | Admitting: Emergency Medicine

## 2021-06-24 DIAGNOSIS — W010XXA Fall on same level from slipping, tripping and stumbling without subsequent striking against object, initial encounter: Secondary | ICD-10-CM | POA: Diagnosis present

## 2021-06-24 DIAGNOSIS — S82832A Other fracture of upper and lower end of left fibula, initial encounter for closed fracture: Secondary | ICD-10-CM | POA: Insufficient documentation

## 2021-06-24 DIAGNOSIS — R296 Repeated falls: Secondary | ICD-10-CM | POA: Diagnosis present

## 2021-06-24 DIAGNOSIS — Y92009 Unspecified place in unspecified non-institutional (private) residence as the place of occurrence of the external cause: Secondary | ICD-10-CM | POA: Diagnosis not present

## 2021-06-24 DIAGNOSIS — Z8546 Personal history of malignant neoplasm of prostate: Secondary | ICD-10-CM

## 2021-06-24 DIAGNOSIS — E119 Type 2 diabetes mellitus without complications: Secondary | ICD-10-CM | POA: Diagnosis not present

## 2021-06-24 DIAGNOSIS — I10 Essential (primary) hypertension: Secondary | ICD-10-CM | POA: Diagnosis present

## 2021-06-24 DIAGNOSIS — Z87891 Personal history of nicotine dependence: Secondary | ICD-10-CM

## 2021-06-24 DIAGNOSIS — Z8249 Family history of ischemic heart disease and other diseases of the circulatory system: Secondary | ICD-10-CM | POA: Diagnosis not present

## 2021-06-24 DIAGNOSIS — Z91040 Latex allergy status: Secondary | ICD-10-CM | POA: Insufficient documentation

## 2021-06-24 DIAGNOSIS — S82891A Other fracture of right lower leg, initial encounter for closed fracture: Secondary | ICD-10-CM | POA: Diagnosis present

## 2021-06-24 DIAGNOSIS — E1140 Type 2 diabetes mellitus with diabetic neuropathy, unspecified: Secondary | ICD-10-CM | POA: Diagnosis present

## 2021-06-24 DIAGNOSIS — Z20822 Contact with and (suspected) exposure to covid-19: Secondary | ICD-10-CM | POA: Diagnosis present

## 2021-06-24 DIAGNOSIS — G20 Parkinson's disease: Secondary | ICD-10-CM | POA: Diagnosis present

## 2021-06-24 DIAGNOSIS — M25472 Effusion, left ankle: Secondary | ICD-10-CM | POA: Diagnosis present

## 2021-06-24 DIAGNOSIS — Z9079 Acquired absence of other genital organ(s): Secondary | ICD-10-CM | POA: Diagnosis not present

## 2021-06-24 DIAGNOSIS — G20A1 Parkinson's disease without dyskinesia, without mention of fluctuations: Secondary | ICD-10-CM | POA: Diagnosis present

## 2021-06-24 DIAGNOSIS — E785 Hyperlipidemia, unspecified: Secondary | ICD-10-CM | POA: Diagnosis present

## 2021-06-24 DIAGNOSIS — E1165 Type 2 diabetes mellitus with hyperglycemia: Secondary | ICD-10-CM | POA: Diagnosis present

## 2021-06-24 DIAGNOSIS — S92321A Displaced fracture of second metatarsal bone, right foot, initial encounter for closed fracture: Secondary | ICD-10-CM | POA: Diagnosis present

## 2021-06-24 DIAGNOSIS — F319 Bipolar disorder, unspecified: Secondary | ICD-10-CM | POA: Diagnosis present

## 2021-06-24 DIAGNOSIS — S92331A Displaced fracture of third metatarsal bone, right foot, initial encounter for closed fracture: Secondary | ICD-10-CM | POA: Diagnosis present

## 2021-06-24 DIAGNOSIS — S8262XA Displaced fracture of lateral malleolus of left fibula, initial encounter for closed fracture: Principal | ICD-10-CM | POA: Diagnosis present

## 2021-06-24 DIAGNOSIS — W19XXXA Unspecified fall, initial encounter: Secondary | ICD-10-CM

## 2021-06-24 DIAGNOSIS — T148XXA Other injury of unspecified body region, initial encounter: Secondary | ICD-10-CM

## 2021-06-24 DIAGNOSIS — S82892A Other fracture of left lower leg, initial encounter for closed fracture: Secondary | ICD-10-CM | POA: Diagnosis not present

## 2021-06-24 LAB — BASIC METABOLIC PANEL
Anion gap: 6 (ref 5–15)
BUN: 24 mg/dL — ABNORMAL HIGH (ref 8–23)
CO2: 27 mmol/L (ref 22–32)
Calcium: 9.7 mg/dL (ref 8.9–10.3)
Chloride: 105 mmol/L (ref 98–111)
Creatinine, Ser: 0.94 mg/dL (ref 0.61–1.24)
GFR, Estimated: >60 mL/min (ref >60)
Glucose, Bld: 160 mg/dL — ABNORMAL HIGH (ref 70–99)
Potassium: 3.9 mmol/L (ref 3.5–5.1)
Sodium: 138 mmol/L (ref 135–145)

## 2021-06-24 LAB — CBC
HCT: 29.2 % — ABNORMAL LOW (ref 39.0–52.0)
Hemoglobin: 9.9 g/dL — ABNORMAL LOW (ref 13.0–17.0)
MCH: 32.2 pg (ref 26.0–34.0)
MCHC: 33.9 g/dL (ref 30.0–36.0)
MCV: 95.1 fL (ref 80.0–100.0)
Platelets: 151 10*3/uL (ref 150–400)
RBC: 3.07 MIL/uL — ABNORMAL LOW (ref 4.22–5.81)
RDW: 12.1 % (ref 11.5–15.5)
WBC: 9 10*3/uL (ref 4.0–10.5)
nRBC: 0 % (ref 0.0–0.2)

## 2021-06-24 MED ORDER — ROPINIROLE HCL 1 MG PO TABS
2.0000 mg | ORAL_TABLET | Freq: Every day | ORAL | Status: DC
  Administered 2021-06-24 – 2021-06-27 (×4): 2 mg via ORAL
  Filled 2021-06-24 (×4): qty 2

## 2021-06-24 MED ORDER — HYDROCODONE-ACETAMINOPHEN 5-325 MG PO TABS
1.0000 | ORAL_TABLET | Freq: Once | ORAL | Status: AC
  Administered 2021-06-24: 1 via ORAL
  Filled 2021-06-24: qty 1

## 2021-06-24 MED ORDER — POLYETHYLENE GLYCOL 3350 17 G PO PACK: 17.0000 g | PACK | Freq: Every day | ORAL | Status: DC | PRN

## 2021-06-24 MED ORDER — SODIUM CHLORIDE 0.9 % IV SOLN: 250.0000 mL | INTRAVENOUS | Status: DC | PRN

## 2021-06-24 MED ORDER — CARBIDOPA-LEVODOPA 25-100 MG PO TABS
1.0000 | ORAL_TABLET | Freq: Four times a day (QID) | ORAL | Status: DC
  Administered 2021-06-24 – 2021-06-28 (×15): 1 via ORAL
  Filled 2021-06-24 (×15): qty 1

## 2021-06-24 MED ORDER — ONDANSETRON HCL 4 MG/2ML IJ SOLN: 4.0000 mg | Freq: Four times a day (QID) | INTRAMUSCULAR | Status: DC | PRN

## 2021-06-24 MED ORDER — LITHIUM CARBONATE 300 MG PO CAPS
300.0000 mg | ORAL_CAPSULE | Freq: Two times a day (BID) | ORAL | Status: DC
  Administered 2021-06-25 – 2021-06-28 (×7): 300 mg via ORAL
  Filled 2021-06-24 (×10): qty 1

## 2021-06-24 MED ORDER — SODIUM CHLORIDE 0.9% FLUSH: 3.0000 mL | INTRAVENOUS | Status: DC | PRN

## 2021-06-24 MED ORDER — SODIUM CHLORIDE 0.9% FLUSH
3.0000 mL | Freq: Two times a day (BID) | INTRAVENOUS | Status: DC
  Administered 2021-06-24 – 2021-06-28 (×7): 3 mL via INTRAVENOUS

## 2021-06-24 MED ORDER — ENOXAPARIN SODIUM 40 MG/0.4ML IJ SOSY
40.0000 mg | PREFILLED_SYRINGE | INTRAMUSCULAR | Status: DC
  Administered 2021-06-24 – 2021-06-27 (×4): 40 mg via SUBCUTANEOUS
  Filled 2021-06-24 (×4): qty 0.4

## 2021-06-24 MED ORDER — HYDROCODONE-ACETAMINOPHEN 5-325 MG PO TABS: 1.0000 | ORAL_TABLET | Freq: Four times a day (QID) | ORAL | 0 refills | Status: DC | PRN

## 2021-06-24 MED ORDER — ASPIRIN EC 81 MG PO TBEC
81.0000 mg | DELAYED_RELEASE_TABLET | Freq: Every day | ORAL | Status: DC
  Administered 2021-06-24 – 2021-06-27 (×4): 81 mg via ORAL
  Filled 2021-06-24 (×4): qty 1

## 2021-06-24 MED ORDER — TRAZODONE HCL 50 MG PO TABS
50.0000 mg | ORAL_TABLET | Freq: Every day | ORAL | Status: DC
  Administered 2021-06-24: 50 mg via ORAL
  Administered 2021-06-25: 100 mg via ORAL
  Administered 2021-06-26: 21:00:00 50 mg via ORAL
  Administered 2021-06-27: 22:00:00 100 mg via ORAL
  Filled 2021-06-24: qty 1
  Filled 2021-06-24 (×2): qty 2
  Filled 2021-06-24: qty 1

## 2021-06-24 MED ORDER — ATORVASTATIN CALCIUM 20 MG PO TABS
40.0000 mg | ORAL_TABLET | Freq: Every day | ORAL | Status: DC
  Administered 2021-06-24 – 2021-06-27 (×4): 40 mg via ORAL
  Filled 2021-06-24 (×4): qty 2

## 2021-06-24 MED ORDER — ONDANSETRON HCL 4 MG PO TABS: 4.0000 mg | ORAL_TABLET | Freq: Four times a day (QID) | ORAL | Status: DC | PRN

## 2021-06-24 MED ORDER — HYDROCODONE-ACETAMINOPHEN 5-325 MG PO TABS
1.0000 | ORAL_TABLET | Freq: Four times a day (QID) | ORAL | Status: DC | PRN
  Administered 2021-06-24 – 2021-06-27 (×5): 1 via ORAL
  Filled 2021-06-24 (×5): qty 1

## 2021-06-24 NOTE — ED Notes (Signed)
Pt has called family to pick him up.

## 2021-06-24 NOTE — ED Provider Notes (Signed)
[facility] Provider Note    Event Date/Time   First MD Initiated Contact with Patient 06/24/21 1044     (approximate)   History   Rehab Placement    HPI  Craig Wilson is a 81 y.o. male who presents to the ED for evaluation of Rehab Placement    I review PCP visit from 05/10/21. Hx DM, HTN, HLD and prostate cancer s/p radical prostatectomy and radiation. 2 ED visits in the past 10 days for mechanical falls.  First, on 1/12 he had a closed nondisplaced fracture to his right distal fibula and right second and third metatarsals, and returned overnight/early this morning after another mechanical fall and has now left-sided distal fibular fractures.  He requested discharge earlier this morning from the ED and wanted to go home, but once his daughter came to pick him up she convinced him to stay for placement for rehab considering his bilateral ankle fractures.    Physical Exam   Triage Vital Signs: ED Triage Vitals [06/24/21 1027]  Enc Vitals Group     BP      Pulse      Resp      Temp      Temp src      SpO2      Weight 174 lb 2.6 oz (79 kg)     Height 5\' 7"  (1.702 m)     Head Circumference      Peak Flow      Pain Score 0     Pain Loc      Pain Edu?      Excl. in [place]?     Most recent vital signs: Vitals:   06/24/21 1049  BP: (!) 156/67  Pulse: 74  Resp: 20  Temp: 98.1 F (36.7 C)  SpO2: 98%    General: Awake, no distress.  CV:  Good peripheral perfusion.  Resp:  Normal effort.  Abd:  No distention.  MSK:  Walking boot on the right and on the left has a new splint that is a stirrup and posterior slab Neuro:  No focal deficits appreciated. Other:     ED Results / Procedures / Treatments   Labs (all labs ordered are listed, but only abnormal results are displayed) Labs Reviewed - No data to display  EKG    RADIOLOGY I review plain films of the left ankle with a distal fibular fracture.  Official radiology  report(s): DG Ankle Complete Left  Result Date: 06/24/2021 CLINICAL DATA:  81 year old male status post fall with pain and swelling. EXAM: LEFT ANKLE COMPLETE - 3+ VIEW COMPARISON:  None. FINDINGS: Oblique, comminuted fracture of the distal left fibula affecting the metadiaphysis and likely extending to the lateral malleolus. Minimal displacement. Minimal lateral subluxation of the mortise joint. Talar dome intact. No evidence of joint effusion. Distal tibia, calcaneus and other visible bones of the left foot appear intact. IMPRESSION: Oblique, mildly comminuted and minimally displaced fracture of the distal left fibula. Electronically Signed   By: Genevie Ann M.D.   On: 06/24/2021 06:24   DG Hip Unilat With Pelvis 2-3 Views Left  Result Date: 06/24/2021 CLINICAL DATA:  81 year old male status post fall with pain and swelling. EXAM: DG HIP (WITH OR WITHOUT PELVIS) 2-3V LEFT COMPARISON:  Left hip series 05/06/2021. FINDINGS: Pelvis appears stable and intact. Femoral heads remain normally located. Numerous chronic surgical clips, likely prostatectomy with chronic penile implant. Grossly intact proximal right femur. Stable and intact proximal left  femur. No acute osseous abnormality identified. Negative visible bowel gas. IMPRESSION: No acute fracture or dislocation identified about the left hip or pelvis. Electronically Signed   By: Genevie Ann M.D.   On: 06/24/2021 06:25    PROCEDURES and INTERVENTIONS:  Procedures  Medications - No data to display   IMPRESSION / MDM / [place] / ED COURSE  I reviewed the triage vital signs and the nursing notes.  81 year old male presents to the ED due to inability to care for himself at home with bilateral ankle fractures requiring medical admission to facilitate rehab placement.  He was seen by my overnight colleagues earlier this morning and discharged home at the patient's request, but once family comes to pick him up, they do convince him to stay for rehab  placement and physical therapy.  No acute events, falls or injuries while waiting in the waiting room.  Has a walking boot on the right and a splint on the left without evidence of compartment syndrome or complications.  I consulted with hospitalist who agrees to admit to help facilitate rehab placement.      FINAL CLINICAL IMPRESSION(S) / ED DIAGNOSES   Final diagnoses:  Other closed fracture of distal end of left fibula, initial encounter     Rx / DC Orders   ED Discharge Orders     None        Note:  This document was prepared using Dragon voice recognition software and may include unintentional dictation errors.   Vladimir Crofts, MD 06/24/21 1106

## 2021-06-24 NOTE — H&P (Signed)
History and Physical    Craig Wilson OVZ:[id number] DOB: 18-Jun-1940 DOA: 06/24/2021  PCP: Adin Hector, MD   Patient coming from: Home  I have personally briefly reviewed patient's old medical records in [place]  Chief Complaint: Status post fall  HPI: Craig Wilson is a 81 y.o. male with medical history significant for prostate cancer status post radical prostatectomy, hypertension, diabetes mellitus who presents to the ER for evaluation after a fall.  Patient was seen in the emergency room 10 days ago after a fall and at that time was found to have a closed nondisplaced fracture of his right distal fibula as well as right second and third metatarsals and was discharged home on a walking boot to follow-up with orthopedic surgery as an outpatient. He returns to the ER after another fall at home with inability to ambulate and now has a minimally displaced fracture of the distal left fibula.   He denies feeling dizzy or lightheaded, denies having any nausea, no vomiting, no abdominal pain, no chest pain, no shortness of breath, no fever, no chills, no cough, no urinary symptoms, no changes in his bowel habits, no palpitations or diaphoresis. Sodium 132, potassium 4.7, chloride 99, bicarb 26, glucose 123, BUN 15, creatinine 1.0, calcium 10.0, white count 10.7, hemoglobin 13.3, hematocrit 41.5, MCV 101.5, RDW 12.0, platelet count 117 Left ankle x-ray shows oblique, mildly comminuted and minimally displaced fracture of the distal left fibula. Left hip x-ray shows no acute fracture or dislocation.     ED Course: Patient is an 81 year old male who presents to the ER for evaluation after a fall and is found to have nondisplaced left ankle fracture. He was seen 10 days prior to this hospitalization after a fall and had a right ankle as well as third and fourth metatarsal fracture. Patient is unable to ambulate and will be admitted to the hospital for further  evaluation.    Review of Systems: As per HPI otherwise all other systems reviewed and negative.    Past Medical History:  Diagnosis Date   Anemia    Bipolar disorder (HCC)    Chronic diarrhea    Colon polyps    Depression    Dermatophytosis    Diabetic neuropathy (HCC)    DJD (degenerative joint disease)    Dyspnea    Gout    Gross hematuria    HLD (hyperlipidemia)    HTN (hypertension)    Hyperkeratosis    Onychomycosis    Parkinson disease ([place])    Prostate cancer ([place])    Sleep apnea    Tremor    Type 2 diabetes mellitus ([place])     Past Surgical History:  Procedure Laterality Date   cataract surgery Bilateral    COLONOSCOPY W/ BIOPSIES     FOOT SURGERY     PROSTATE SURGERY     removed   status post implantation of artificial urinary sphincter      TONSILLECTOMY       reports that he quit smoking about 21 years ago. His smoking use included cigarettes. He has never used smokeless tobacco. He reports that he does not drink alcohol and does not use drugs.  Allergies  Allergen Reactions   Carbidopa W-Levodopa Other (See Comments)    irrational behavior , felt zombie like   Carbidopa-Levodopa Other (See Comments)    irrational behavior , felt zombie like   Latex Other (See Comments)    Internal such as catheters. Latex gloves  are okay   Solifenacin Other (See Comments)    Family History  Problem Relation Age of Onset   Heart disease Mother    Heart attack Father 67   Hypertension Father    Hyperlipidemia Father    Kidney cancer Neg Hx    Prostate cancer Neg Hx    Bladder Cancer Neg Hx       Prior to Admission medications   Medication Sig Start Date End Date Taking? Authorizing Provider  aspirin EC 81 MG tablet Take 81 mg by mouth daily. Swallow whole.   Yes [provider]  atorvastatin (LIPITOR) 40 MG tablet Take 1 tablet (40 mg total) by mouth every evening. 12/19/20  Yes Gollan, Kathlene November, MD  carbidopa-levodopa (SINEMET IR) 25-100 MG  tablet Take 1 tablet by mouth 4 (four) times daily.   Yes [provider]  lithium carbonate 300 MG capsule Take 300 mg by mouth 2 (two) times daily.   Yes [provider]  rOPINIRole (REQUIP) 2 MG tablet Take 2 mg by mouth at bedtime.   Yes [provider]  traZODone (DESYREL) 50 MG tablet Take 50-100 mg by mouth at bedtime.   Yes [provider]  docusate sodium (COLACE) 100 MG capsule Take 1 capsule (100 mg total) by mouth 2 (two) times daily. Patient not taking: Reported on 12/11/2020 11/09/20   Edwin Dada, MD  HYDROcodone-acetaminophen (NORCO) 5-325 MG tablet Take 1 tablet by mouth every 6 (six) hours as needed for moderate pain. 06/24/21   Paulette Blanch, MD  polyethylene glycol (MIRALAX / GLYCOLAX) 17 g packet Take 17 g by mouth daily as needed for moderate constipation. Patient not taking: Reported on 06/24/2021 11/09/20   Edwin Dada, MD    Physical Exam: Vitals:   06/24/21 1027 06/24/21 1049  BP:  (!) 156/67  Pulse:  74  Resp:  20  Temp:  98.1 F (36.7 C)  SpO2:  98%  Weight: 79 kg   Height: 5\' 7"  (1.702 m)      Vitals:   06/24/21 1027 06/24/21 1049  BP:  (!) 156/67  Pulse:  74  Resp:  20  Temp:  98.1 F (36.7 C)  SpO2:  98%  Weight: 79 kg   Height: 5\' 7"  (1.702 m)       Constitutional: Alert and oriented x 3 . Not in any apparent distress HEENT:      Head: Normocephalic and atraumatic.         Eyes: PERLA, EOMI, Conjunctivae are normal. Sclera is non-icteric.       Mouth/Throat: Mucous membranes are moist.       Neck: Supple with no signs of meningismus. Cardiovascular: Regular rate and rhythm. No murmurs, gallops, or rubs. 2+ symmetrical distal pulses are present . No JVD. No LE edema Respiratory: Respiratory effort normal .Lungs sounds clear bilaterally. No wheezes, crackles, or rhonchi.  Gastrointestinal: Soft, non tender, and non distended with positive bowel sounds.  Genitourinary: No CVA  tenderness. Musculoskeletal: Decreased range of motion left and right ankle. No cyanosis, or erythema of extremities. Neurologic:  Face is symmetric. Moving all extremities. No gross focal neurologic deficits . Skin: Skin is warm, dry.  No rash or ulcers Psychiatric: Mood and affect are normal    Labs on Admission: I have personally reviewed following labs and imaging studies  CBC: No results for input(s): WBC, NEUTROABS, HGB, HCT, MCV, PLT in the last 168 hours. Basic Metabolic Panel: No results for input(s): NA,  K, CL, CO2, GLUCOSE, BUN, CREATININE, CALCIUM, MG, PHOS in the last 168 hours. GFR: CrCl cannot be calculated (Patient's most recent lab result is older than the maximum 21 days allowed.). Liver Function Tests: No results for input(s): AST, ALT, ALKPHOS, BILITOT, PROT, ALBUMIN in the last 168 hours. No results for input(s): LIPASE, AMYLASE in the last 168 hours. No results for input(s): AMMONIA in the last 168 hours. Coagulation Profile: No results for input(s): INR, PROTIME in the last 168 hours. Cardiac Enzymes: No results for input(s): CKTOTAL, CKMB, CKMBINDEX, TROPONINI in the last 168 hours. BNP (last 3 results) No results for input(s): PROBNP in the last 8760 hours. HbA1C: No results for input(s): HGBA1C in the last 72 hours. CBG: No results for input(s): GLUCAP in the last 168 hours. Lipid Profile: No results for input(s): CHOL, HDL, LDLCALC, TRIG, CHOLHDL, LDLDIRECT in the last 72 hours. Thyroid Function Tests: No results for input(s): TSH, T4TOTAL, FREET4, T3FREE, THYROIDAB in the last 72 hours. Anemia Panel: No results for input(s): VITAMINB12, FOLATE, FERRITIN, TIBC, IRON, RETICCTPCT in the last 72 hours. Urine analysis:    Component Value Date/Time   COLORURINE YELLOW (A) 11/05/2020 1021   APPEARANCEUR CLEAR (A) 11/05/2020 1021   APPEARANCEUR Clear 03/11/2019 1354   LABSPEC 1.014 11/05/2020 1021   PHURINE 6.0 11/05/2020 1021   GLUCOSEU 50 (A)  11/05/2020 1021   HGBUR NEGATIVE 11/05/2020 1021   BILIRUBINUR NEGATIVE 11/05/2020 1021   BILIRUBINUR Negative 03/11/2019 1354   KETONESUR NEGATIVE 11/05/2020 1021   PROTEINUR 30 (A) 11/05/2020 1021   UROBILINOGEN 0.2 03/23/2010 1424   NITRITE NEGATIVE 11/05/2020 1021   LEUKOCYTESUR NEGATIVE 11/05/2020 1021    Radiological Exams on Admission: DG Ankle Complete Left  Result Date: 06/24/2021 CLINICAL DATA:  81 year old male status post fall with pain and swelling. EXAM: LEFT ANKLE COMPLETE - 3+ VIEW COMPARISON:  None. FINDINGS: Oblique, comminuted fracture of the distal left fibula affecting the metadiaphysis and likely extending to the lateral malleolus. Minimal displacement. Minimal lateral subluxation of the mortise joint. Talar dome intact. No evidence of joint effusion. Distal tibia, calcaneus and other visible bones of the left foot appear intact. IMPRESSION: Oblique, mildly comminuted and minimally displaced fracture of the distal left fibula. Electronically Signed   By: Genevie Ann M.D.   On: 06/24/2021 06:24   DG Hip Unilat With Pelvis 2-3 Views Left  Result Date: 06/24/2021 CLINICAL DATA:  81 year old male status post fall with pain and swelling. EXAM: DG HIP (WITH OR WITHOUT PELVIS) 2-3V LEFT COMPARISON:  Left hip series 05/06/2021. FINDINGS: Pelvis appears stable and intact. Femoral heads remain normally located. Numerous chronic surgical clips, likely prostatectomy with chronic penile implant. Grossly intact proximal right femur. Stable and intact proximal left femur. No acute osseous abnormality identified. Negative visible bowel gas. IMPRESSION: No acute fracture or dislocation identified about the left hip or pelvis. Electronically Signed   By: Genevie Ann M.D.   On: 06/24/2021 06:25     Assessment/Plan Principal Problem:   Frequent falls Active Problems:   Parkinson's disease ([place])   Bipolar disorder ([place])   Type 2 diabetes mellitus without complication, without long-term current  use of insulin (HCC)   Closed bilateral ankle fractures   Patient is an 81 year old male admitted to the hospital for bilateral ankle fracture following mechanical fall    Frequent falls With bilateral ankle fracture Pain control Consult orthopedic surgery Place patient on fall precautions    Parkinson's disease Continue Sinemet    Diabetes mellitus Maintain  consistent carbohydrate diet Check blood sugars with meals    Bipolar disorder Continue lithium and trazodone    DVT prophylaxis: Lovenox  Code Status: full code  Family Communication: Greater than 50% of time was spent discussing patient's condition and plan of care with him at the bedside.  All questions and concerns have been addressed.  He verbalizes understanding and agrees to the plan. Disposition Plan: Back to previous home environment Consults called: Orthopedic surgery Status:At the time of admission, it appears that the appropriate admission status for this patient is inpatient. This is judged to be reasonable and necessary to provide the required intensity of service to ensure the patient's safety given the presenting symptoms, physical exam findings, and initial radiographic and laboratory data in the context of their comorbid conditions. Patient requires inpatient status due to high intensity of service, high risk for further deterioration and high frequency of surveillance required.     Collier Bullock MD [facility]     06/24/2021, 12:24 PM

## 2021-06-24 NOTE — ED Provider Notes (Signed)
[facility] Provider Note    Event Date/Time   First MD Initiated Contact with Patient 06/24/21 [phone]     (approximate)   History   Fall   HPI  Craig Wilson is a 81 y.o. male  brought to the ED via EMS from home status post mechanical fall with left ankle and hip pain. Patient has a boot on his right lower leg for metatarsal and ankle fracture. Fell after getting boot caught after getting up. Denies striking head or LOC. Denies anticoagulant use. Complains of left hip and ankle pain. Voices no other complaints or injuries.      Past Medical History   Past Medical History:  Diagnosis Date   Anemia    Bipolar disorder ([place])    Chronic diarrhea    Colon polyps    Depression    Dermatophytosis    Diabetic neuropathy (HCC)    DJD (degenerative joint disease)    Dyspnea    Gout    Gross hematuria    HLD (hyperlipidemia)    HTN (hypertension)    Hyperkeratosis    Onychomycosis    Parkinson disease ([place])    Prostate cancer ([place])    Sleep apnea    Tremor    Type 2 diabetes mellitus [facility])      Active Problem List   Patient Active Problem List   Diagnosis Date Noted   Acute respiratory failure with hypoxia and hypercapnia (HCC)    Altered mental status    Sepsis due to pneumonia ([place]) 11/05/2020   Acute kidney injury ([place]) 10/31/2020   Bladder outlet obstruction 10/31/2020   Hyponatremia 10/31/2020   Acute cystitis without hematuria 10/31/2020   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 04/27/2020   OSA (obstructive sleep apnea) 11/03/2019   RBBB (right bundle branch block) 11/03/2019   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   Other emphysema ([place]) 06/27/2017   Acquired trigger finger 04/10/2017   Elevated coronary artery calcium score 03/04/2017   Sepsis ([place]) 07/01/2016   CAP (community acquired pneumonia) 07/01/2016   Influenza A 07/01/2016   DDD (degenerative disc disease), lumbar 03/11/2016   Senile purpura ([place]) 03/11/2016    Spiradenoma 12/01/2015   Mass of left side of neck 10/19/2015   Thrombocytopenia ([place]) 09/07/2015   Bilateral edema of lower extremity 05/10/2015   Bipolar disorder, in partial remission, most recent episode depressed ([place]) 05/10/2015   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[place]) 05/10/2015   Edema of lower extremity 05/10/2015   RLS (restless legs syndrome) 03/29/2015   Adenomatous polyp 02/24/2015   Anemia 02/24/2015   Affective bipolar disorder ([place]) 02/24/2015   Chronic diarrhea 02/24/2015   Diabetic neuropathy ([place]) 02/24/2015   Arthritis, degenerative 02/24/2015   Arthritis urica 02/24/2015   BP (high blood pressure) 02/24/2015   CA of prostate ([place]) 02/24/2015   Incontinence 02/01/2015   History of prostate cancer 02/01/2015   Gross hematuria 02/01/2015   SOB (shortness of breath) 12/16/2014   Pain in the chest 12/16/2014   Family history of premature CAD 12/16/2014   Parkinson's disease ([place]) 12/16/2014   Essential hypertension 12/16/2014   Unsteady gait 12/16/2014   Hyperlipidemia 12/16/2014   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[place]) 11/23/2014   Difficulty in walking 10/10/2014   Difficulty sleeping 10/10/2014   Breath shortness 05/06/2014   Amnesia 02/28/2014   Type 2 diabetes mellitus without complication, without long-term current use of insulin ([place]) 02/15/2014   Has a tremor 02/01/2006   Tremor 02/01/2006  Past Surgical History   Past Surgical History:  Procedure Laterality Date   cataract surgery Bilateral    COLONOSCOPY W/ BIOPSIES     FOOT SURGERY     PROSTATE SURGERY     removed   status post implantation of artificial urinary sphincter      TONSILLECTOMY       Home Medications   Prior to Admission medications   Medication Sig Start Date End Date Taking? Authorizing Provider  HYDROcodone-acetaminophen (NORCO) 5-325 MG tablet Take 1 tablet by mouth every 6 (six) hours as needed for moderate pain. 06/24/21  Yes Paulette Blanch, MD   atorvastatin (LIPITOR) 40 MG tablet Take 1 tablet (40 mg total) by mouth every evening. 12/19/20   Minna Merritts, MD  carbidopa-levodopa (SINEMET IR) 25-100 MG tablet Take 1 tablet by mouth 4 (four) times daily.    [provider]  docusate sodium (COLACE) 100 MG capsule Take 1 capsule (100 mg total) by mouth 2 (two) times daily. Patient not taking: No sig reported 11/09/20   Edwin Dada, MD  lithium carbonate 300 MG capsule Take 300 mg by mouth 2 (two) times daily.    [provider]  polyethylene glycol (MIRALAX / GLYCOLAX) 17 g packet Take 17 g by mouth daily as needed for moderate constipation. 11/09/20   Danford, Suann Larry, MD  rOPINIRole (REQUIP) 2 MG tablet Take 2 mg by mouth at bedtime.    [provider]  traZODone (DESYREL) 50 MG tablet Take 50-100 mg by mouth at bedtime.    [provider]     Allergies  Carbidopa w-levodopa, Carbidopa-levodopa, Latex, and Solifenacin   Family History   Family History  Problem Relation Age of Onset   Heart disease Mother    Heart attack Father 19   Hypertension Father    Hyperlipidemia Father    Kidney cancer Neg Hx    Prostate cancer Neg Hx    Bladder Cancer Neg Hx      Physical Exam  Triage Vital Signs: ED Triage Vitals  Enc Vitals Group     BP 06/24/21 0451 (!) 177/69     Pulse Rate 06/24/21 0451 83     Resp 06/24/21 0451 18     Temp 06/24/21 0451 98.6 F (37 C)     Temp Source 06/24/21 0451 Oral     SpO2 06/24/21 0451 100 %     Weight 06/24/21 0452 175 lb (79.4 kg)     Height 06/24/21 0452 5\' 7"  (1.702 m)     Head Circumference --      Peak Flow --      Pain Score 06/24/21 0447 7     Pain Loc --      Pain Edu? --      Excl. in [place]? --     Updated Vital Signs: BP (!) 177/69 (BP Location: Right Arm)    Pulse 83    Temp 98.6 F (37 C) (Oral)    Resp 18    Ht 5\' 7"  (1.702 m)    Wt 79.4 kg    SpO2 100%    BMI 27.41 kg/m    General: Awake, no distress.  CV:  Good  peripheral perfusion.  Resp:  Normal effort.  Abd:  No distention.  Other:  Pelvis stable. Left hip tender to palpation with limited range of motion secondary to pain. Left lateral malleolus and dorsal foot with mild swelling. Limited ROM secondary to pain. 2+ distal pulses.  Brisk, less than 5 second capillary refill.   ED Results / Procedures / Treatments  Labs (all labs ordered are listed, but only abnormal results are displayed) Labs Reviewed - No data to display   EKG  None   RADIOLOGY I have personally viewed patient's left hip and ankle xrays as well as the radiology interpretation:  Left hip xray: Negative  Left ankle xray: Distal fibula fracture  Official radiology report(s): DG Ankle Complete Left  Result Date: 06/24/2021 CLINICAL DATA:  81 year old male status post fall with pain and swelling. EXAM: LEFT ANKLE COMPLETE - 3+ VIEW COMPARISON:  None. FINDINGS: Oblique, comminuted fracture of the distal left fibula affecting the metadiaphysis and likely extending to the lateral malleolus. Minimal displacement. Minimal lateral subluxation of the mortise joint. Talar dome intact. No evidence of joint effusion. Distal tibia, calcaneus and other visible bones of the left foot appear intact. IMPRESSION: Oblique, mildly comminuted and minimally displaced fracture of the distal left fibula. Electronically Signed   By: Genevie Ann M.D.   On: 06/24/2021 06:24   DG Hip Unilat With Pelvis 2-3 Views Left  Result Date: 06/24/2021 CLINICAL DATA:  81 year old male status post fall with pain and swelling. EXAM: DG HIP (WITH OR WITHOUT PELVIS) 2-3V LEFT COMPARISON:  Left hip series 05/06/2021. FINDINGS: Pelvis appears stable and intact. Femoral heads remain normally located. Numerous chronic surgical clips, likely prostatectomy with chronic penile implant. Grossly intact proximal right femur. Stable and intact proximal left femur. No acute osseous abnormality identified. Negative visible bowel gas.  IMPRESSION: No acute fracture or dislocation identified about the left hip or pelvis. Electronically Signed   By: Genevie Ann M.D.   On: 06/24/2021 06:25     PROCEDURES:  Critical Care performed: No  Procedures   MEDICATIONS ORDERED IN ED: Medications  HYDROcodone-acetaminophen (NORCO/VICODIN) 5-325 MG per tablet 1 tablet (has no administration in time range)     IMPRESSION / MDM / ASSESSMENT AND PLAN / ED COURSE  I reviewed the triage vital signs and the nursing notes.                             81 year old male who presents to the ED status post mechanical fall with left hip and ankle pain. Differential diagnosis includes but is not limited to fracture, dislocation, contusion, sprain, etc.  Will obtain plain film xrays of left hip and ankle. Patient denies pain while laying still.  Clinical Course as of 06/24/21 5638  Nancy Fetter Jun 24, 2021  0648 Updated patient on negative left hip xray and distal fibula fracture. Discussed with patient at length and offered social work and physical therapy consults for rehab as patient now has bilateral ankle fractures. He explains to me that his wife suffers from seizures and he really wants to be discharged home to take care of her. Will place LLE in splint and either supply patient with or provide prescription for wheelchair as I feel that would be the safest modality for mobility given his bilateral ankle fractures. Will discharge home per patient's wishes and provide as needed prescription for Norco. Patient has podiatry appointment in 2 days already to evaluate right foot/ankle injuries. Strict return precautions given. Patient verbalizes understanding and agrees with plan of care. [JS]    Clinical Course User Index [JS] Paulette Blanch, MD     FINAL CLINICAL IMPRESSION(S) / ED DIAGNOSES   Final diagnoses:  Fall, initial encounter  Closed  fracture of distal end of left fibula, unspecified fracture morphology, initial encounter     Rx / DC  Orders   ED Discharge Orders          Ordered    HYDROcodone-acetaminophen (NORCO) 5-325 MG tablet  Every 6 hours PRN        06/24/21 0645    Wheelchair        06/24/21 0645             Note:  This document was prepared using Dragon voice recognition software and may include unintentional dictation errors.   Paulette Blanch, MD 06/24/21 [id number]

## 2021-06-24 NOTE — Discharge Instructions (Addendum)
You may take Norco as needed for pain. Keep splint clean and dry. Use wheelchair to get around. Elevate affected area and apply ice over splint several times daily to reduce swelling. Return to the ER for worsening symptoms, persistent vomiting, difficulty breathing or other concerns.

## 2021-06-24 NOTE — ED Notes (Signed)
Pt neighbor called. Then pt wife called at 70am and asked could she make him go to a rehab facility. Wife was informed the spouse had refused all assistance for rehab.

## 2021-06-24 NOTE — ED Notes (Signed)
Splint applied to left leg with ED tech.

## 2021-06-24 NOTE — ED Triage Notes (Addendum)
Pt BIB EMS for fall after getting foot caught up due to prior right ankle frx, EMS reports left ankle swelling and bruising, denies LOC or hitting head, denies blood thinners

## 2021-06-24 NOTE — Consult Note (Signed)
ORTHOPAEDIC CONSULTATION  REQUESTING PHYSICIAN: Collier Bullock, MD  Chief Complaint:   Bilateral ankle pain  History of Present Illness: Craig Wilson is a 81 y.o. male with medical history significant for bipolar disorder, prostate cancer status post radical prostatectomy, hypertension, diabetes mellitus who presents to the ER for evaluation after a fall.  Of note, he had a prior fall  and presented to the emergency department on 06/14/2021.  He was noted to have a minimally displaced lateral malleolus fracture and nondisplaced second and third metatarsal base fractures.  He was instructed to be nonweightbearing at that time and had outpatient follow-up with [facility] arranged for tomorrow with Dr. Cleda Mccreedy.  Most recent fall occurred last night after he got his walking boot caught up as he attempted to stand.  Patient states that he was able to bear some weight on the right lower extremity through the ball of his foot at that time.  This fall resulted in increased left ankle pain.  Radiographs in the emergency department showed a minimally displaced left lateral malleolus fracture.  A splint was placed on the left lower extremity.  He was then admitted for placement.  Past Medical History:  Diagnosis Date   Anemia    Bipolar disorder (HCC)    Chronic diarrhea    Colon polyps    Depression    Dermatophytosis    Diabetic neuropathy (HCC)    DJD (degenerative joint disease)    Dyspnea    Gout    Gross hematuria    HLD (hyperlipidemia)    HTN (hypertension)    Hyperkeratosis    Onychomycosis    Parkinson disease (HCC)    Prostate cancer ([place])    Sleep apnea    Tremor    Type 2 diabetes mellitus (HCC)    Past Surgical History:  Procedure Laterality Date   cataract surgery Bilateral    COLONOSCOPY W/ BIOPSIES     FOOT SURGERY     PROSTATE SURGERY     removed   status post implantation of artificial  urinary sphincter      TONSILLECTOMY     Social History   Socioeconomic History   Marital status: Married    Spouse name: Not on file   Number of children: Not on file   Years of education: Not on file   Highest education level: Not on file  Occupational History   Not on file  Tobacco Use   Smoking status: Former    Types: Cigarettes    Quit date: 10/24/1999    Years since quitting: 21.6   Smokeless tobacco: Never  Substance and Sexual Activity   Alcohol use: No    Alcohol/week: 0.0 standard drinks   Drug use: No   Sexual activity: Not on file  Other Topics Concern   Not on file  Social History Narrative   Caffeine use 3 servings daily.   Social Determinants of Health   Financial Resource Strain: Not on file  Food Insecurity: Not on file  Transportation Needs: Not on file  Physical Activity: Not on file  Stress: Not on file  Social Connections: Not on file   Family History  Problem Relation Age of Onset   Heart disease Mother    Heart attack Father 15   Hypertension Father    Hyperlipidemia Father    Kidney cancer Neg Hx    Prostate cancer Neg Hx    Bladder Cancer Neg Hx    Allergies  Allergen Reactions  Carbidopa W-Levodopa Other (See Comments)    irrational behavior , felt zombie like   Carbidopa-Levodopa Other (See Comments)    irrational behavior , felt zombie like   Latex Other (See Comments)    Internal such as catheters. Latex gloves are okay   Solifenacin Other (See Comments)   Prior to Admission medications   Medication Sig Start Date End Date Taking? Authorizing Provider  aspirin EC 81 MG tablet Take 81 mg by mouth daily. Swallow whole.   Yes [provider]  atorvastatin (LIPITOR) 40 MG tablet Take 1 tablet (40 mg total) by mouth every evening. 12/19/20  Yes Gollan, Kathlene November, MD  carbidopa-levodopa (SINEMET IR) 25-100 MG tablet Take 1 tablet by mouth 4 (four) times daily.   Yes [provider]  lithium carbonate 300 MG  capsule Take 300 mg by mouth 2 (two) times daily.   Yes [provider]  rOPINIRole (REQUIP) 2 MG tablet Take 2 mg by mouth at bedtime.   Yes [provider]  traZODone (DESYREL) 50 MG tablet Take 50-100 mg by mouth at bedtime.   Yes [provider]  docusate sodium (COLACE) 100 MG capsule Take 1 capsule (100 mg total) by mouth 2 (two) times daily. Patient not taking: Reported on 12/11/2020 11/09/20   Edwin Dada, MD  HYDROcodone-acetaminophen (NORCO) 5-325 MG tablet Take 1 tablet by mouth every 6 (six) hours as needed for moderate pain. 06/24/21   Paulette Blanch, MD  polyethylene glycol (MIRALAX / GLYCOLAX) 17 g packet Take 17 g by mouth daily as needed for moderate constipation. Patient not taking: Reported on 06/24/2021 11/09/20   Edwin Dada, MD   No results for input(s): WBC, HGB, HCT, PLT, K, CL, CO2, BUN, CREATININE, GLUCOSE, CALCIUM, LABPT, INR in the last 72 hours. DG Ankle Complete Left  Result Date: 06/24/2021 CLINICAL DATA:  81 year old male status post fall with pain and swelling. EXAM: LEFT ANKLE COMPLETE - 3+ VIEW COMPARISON:  None. FINDINGS: Oblique, comminuted fracture of the distal left fibula affecting the metadiaphysis and likely extending to the lateral malleolus. Minimal displacement. Minimal lateral subluxation of the mortise joint. Talar dome intact. No evidence of joint effusion. Distal tibia, calcaneus and other visible bones of the left foot appear intact. IMPRESSION: Oblique, mildly comminuted and minimally displaced fracture of the distal left fibula. Electronically Signed   By: Genevie Ann M.D.   On: 06/24/2021 06:24   DG Hip Unilat With Pelvis 2-3 Views Left  Result Date: 06/24/2021 CLINICAL DATA:  81 year old male status post fall with pain and swelling. EXAM: DG HIP (WITH OR WITHOUT PELVIS) 2-3V LEFT COMPARISON:  Left hip series 05/06/2021. FINDINGS: Pelvis appears stable and intact. Femoral heads remain normally located. Numerous  chronic surgical clips, likely prostatectomy with chronic penile implant. Grossly intact proximal right femur. Stable and intact proximal left femur. No acute osseous abnormality identified. Negative visible bowel gas. IMPRESSION: No acute fracture or dislocation identified about the left hip or pelvis. Electronically Signed   By: Genevie Ann M.D.   On: 06/24/2021 06:25     Positive ROS: All other systems have been reviewed and were otherwise negative with the exception of those mentioned in the HPI and as above.  Physical Exam: BP (!) 156/67 (BP Location: Right Arm)    Pulse 74    Temp 98.1 F (36.7 C)    Resp 20    Ht 5\' 7"  (1.702 m)    Wt 79 kg    SpO2  98%    BMI 27.28 kg/m  General:  Alert, no acute distress Psychiatric:  Patient is competent for consent with normal mood and affect   Cardiovascular:  No pedal edema, regular rate and rhythm Respiratory:  No wheezing, non-labored breathing GI:  Abdomen is soft and non-tender Skin:  No lesions in the area of chief complaint, no erythema Neurologic:  Sensation intact distally, CN grossly intact Lymphatic:  No axillary or cervical lymphadenopathy  Orthopedic Exam:  RLE: 5/5 DF/PF/EHL SILT s/s/t/sp/dp distr Foot wwp Tenderness to palpation over lateral malleolus and over base of second and third metatarsals tenderness to palpation over lateral malleolus Moderate soft tissue swelling about ankle and dorsum of foot Foot in walking boot  LLE: +DF/PF/EHL SILT grossly over toes Toes warm well perfused Short leg splint in place   X-rays:  As above:  -Left ankle x-rays show left lateral malleolus fracture with minimal displacement.  Ankle mortise appears relatively well-maintained although stress views were not obtained. -Right ankle radiographs and right foot radiographs from 06/14/2021 were reviewed.  There appears to be a lateral malleolus fracture with minimal displacement.  Ankle mortise appears relatively well-maintained although stress  views were not obtained.  There also appear to be nondisplaced metatarsal fractures at the base of the second and third metatarsals.  Assessment/Plan: 80 year old male with right minimally displaced lateral malleolus fracture with nondisplaced second and third metatarsal base fractures sustained on 06/14/2021.  He also has a left minimally displaced lateral malleolus fracture sustained yesterday. 1.  We discussed his fractures as well as treatment options.  Given that he was able to bear some weight on the right lower extremity and the fact that he now has bilateral ankle fractures, we agreed to proceed with a trial of weightbearing as tolerated on the right lower extremity given the nature of the right lower extremity fractures. 2.  Given the acuity of the left ankle fracture, I recommended nonweightbearing of the left lower extremity. 3.  Recommend PT/OT for mobilization and placement purposes 4.  Recommend follow-up radiographs of the right ankle and right foot. 5.  Patient was to be seen by Dr. Cleda Mccreedy in podiatry tomorrow for right lateral malleolus and metatarsal fractures.  I recommended outpatient follow-up with Dr. Cleda Mccreedy for bilateral lower extremities as the podiatry team can manage both the ankle and foot fractures.  If you would like, he can follow-up with me to manage the ankle fractures, but he would then need additional evaluation podiatry for the metatarsal fractures.    Craig Wilson   06/24/2021 2:04 PM

## 2021-06-24 NOTE — ED Triage Notes (Signed)
Pt has been sitting in [place] waiting on a ride. Pt daughter here to pick him up but voices concerns that he will not be able to move around the house. Pt admits to daughter that he refused rehab placement initially but now thinks he needs it. Pt checked back in to meet with social work and get placement to a rehab facility for multiple falls and bilateral ankle fractures.

## 2021-06-25 DIAGNOSIS — R296 Repeated falls: Secondary | ICD-10-CM | POA: Diagnosis not present

## 2021-06-25 LAB — BASIC METABOLIC PANEL
Anion gap: 7 (ref 5–15)
BUN: 20 mg/dL (ref 8–23)
CO2: 24 mmol/L (ref 22–32)
Calcium: 9.6 mg/dL (ref 8.9–10.3)
Chloride: 107 mmol/L (ref 98–111)
Creatinine, Ser: 0.81 mg/dL (ref 0.61–1.24)
GFR, Estimated: >60 mL/min (ref >60)
Glucose, Bld: 197 mg/dL — ABNORMAL HIGH (ref 70–99)
Potassium: 4.1 mmol/L (ref 3.5–5.1)
Sodium: 138 mmol/L (ref 135–145)

## 2021-06-25 LAB — GLUCOSE, CAPILLARY
Glucose-Capillary: 176 mg/dL — ABNORMAL HIGH (ref 70–99)
Glucose-Capillary: 150 mg/dL — ABNORMAL HIGH (ref 70–99)
Glucose-Capillary: 212 mg/dL — ABNORMAL HIGH (ref 70–99)

## 2021-06-25 LAB — CBC
HCT: 29.5 % — ABNORMAL LOW (ref 39.0–52.0)
Hemoglobin: 9.8 g/dL — ABNORMAL LOW (ref 13.0–17.0)
MCH: 32.1 pg (ref 26.0–34.0)
MCHC: 33.2 g/dL (ref 30.0–36.0)
MCV: 96.7 fL (ref 80.0–100.0)
Platelets: 141 10*3/uL — ABNORMAL LOW (ref 150–400)
RBC: 3.05 MIL/uL — ABNORMAL LOW (ref 4.22–5.81)
RDW: 12 % (ref 11.5–15.5)
WBC: 7.7 10*3/uL (ref 4.0–10.5)
nRBC: 0 % (ref 0.0–0.2)

## 2021-06-25 NOTE — Evaluation (Signed)
Physical Therapy Evaluation Patient Details Name: Craig Wilson MRN: [id number] DOB: 07-19-1940 Today's Date: 06/25/2021  History of Present Illness  Patient is an 81 year old male who presents to [facility] for frequent falls. Patient has a PMH (+) for for bipolar disorder, prostate cancer status post radical prostatectomy, hypertension, diabetes mellitus. Most frequent fall came when walking boot got caught when patient was trying to stand.  Radiographs in the emergency department showed a minimally displaced left lateral malleolus fracture.  A splint was placed on the left lower extremity.   Clinical Impression  Patient tolerated session fairly and was agreeable to treatment. Patient reports 5/10 pain on the LLE and 0/10 pain on the RLE. Prior to hospitalization, patient was independent with IADLs and Mod I with mobility with 3 wheeled walker at night. Patient reports living with his wife (unable to care for him at discharge due to seizures) in a 1 story condo with 1 threshold step to enter and no hand rails. Patient demonstrated Mod I with bed mobility, however required Min-Mod A +2 to complete sit to stands and stand pivot transfer for safety and maintain weightbearing precautions. Increased unsteadiness in standing due to walking boot on non-affected leg, as well as LE weakness. Patient is requesting [facility] since it is 1 mile from his home. Patient would continue to benefit from skilled physical therapy in order to optimize patient return to his PLOF. Recommend STR following discharge from acute hospitalization.      Recommendations for follow up therapy are one component of a multi-disciplinary discharge planning process, led by the attending physician.  Recommendations may be updated based on patient status, additional functional criteria and insurance authorization.  Follow Up Recommendations Skilled nursing-short term rehab (<3 hours/day)    Assistance Recommended at Discharge     Patient can return home with the following  Two people to help with walking and/or transfers;A lot of help with bathing/dressing/bathroom;Assistance with cooking/housework;Direct supervision/assist for medications management;Assist for transportation;Direct supervision/assist for financial management;Two people to help with bathing/dressing/bathroom;Assistance with feeding;A lot of help with walking and/or transfers    Equipment Recommendations Other (comment) (refer to next level of care)  Recommendations for Other Services       Functional Status Assessment Patient has had a recent decline in their functional status and demonstrates the ability to make significant improvements in function in a reasonable and predictable amount of time.     Precautions / Restrictions Precautions Precautions: Fall Restrictions Weight Bearing Restrictions: Yes RLE Weight Bearing: Weight bearing as tolerated LLE Weight Bearing: Non weight bearing      Mobility  Bed Mobility Overal bed mobility: Modified Independent                  Transfers Overall transfer level: Needs assistance Equipment used: Rolling walker (2 wheels) Transfers: Sit to/from Stand, Bed to chair/wheelchair/BSC Sit to Stand: Min assist, Mod assist, +2 physical assistance (second assist helping at LLE to maintain NWB precautions)     Squat pivot transfers: Mod assist, +2 physical assistance (increased difficulty turning on RLE to complete stand pivot; uncontrolled decent down to chair)          Ambulation/Gait Ambulation/Gait assistance:  (deferred due to safety)                Stairs            Wheelchair Mobility    Modified Rankin (Stroke Patients Only)       Balance Overall balance  assessment: Needs assistance Sitting-balance support: No upper extremity supported, Feet supported Sitting balance-Leahy Scale: Good Sitting balance - Comments: can reach outside BOS while maintaining balance    Standing balance support: Bilateral upper extremity supported, During functional activity, Reliant on assistive device for balance Standing balance-Leahy Scale: Poor                               Pertinent Vitals/Pain Pain Assessment Pain Assessment: 0-10 Pain Score: 5  Pain Location: L foot Pain Descriptors / Indicators: Aching, Discomfort, Dull Pain Intervention(s): Limited activity within patient's tolerance, Monitored during session, Repositioned    Home Living Family/patient expects to be discharged to:: Private residence Living Arrangements: Spouse/significant other Available Help at Discharge: Family Type of Home: House (Condo) Home Access: Stairs to enter Entrance Stairs-Rails: None Entrance Stairs-Number of Steps: 1 small step   Home Layout: One level Home Equipment: Cane - single point;Grab bars - toilet;Grab bars - tub/shower      Prior Function Prior Level of Function : Independent/Modified Independent             Mobility Comments: Reports using his 3 wheeled walker/ rollator only at night ADLs Comments: Independent with all IADLs     Hand Dominance        Extremity/Trunk Assessment   Upper Extremity Assessment Upper Extremity Assessment: Defer to OT evaluation    Lower Extremity Assessment Lower Extremity Assessment: Generalized weakness (3+ to4-/5 Strength supine hip flexion)       Communication   Communication: No difficulties  Cognition Arousal/Alertness: Awake/alert Behavior During Therapy: WFL for tasks assessed/performed Overall Cognitive Status: Within Functional Limits for tasks assessed                                 General Comments: A&O x3        General Comments      Exercises Other Exercises Other Exercises: patient educated on WBing precautions, fall risk, role of PT   Assessment/Plan    PT Assessment Patient needs continued PT services  PT Problem List Decreased strength;Decreased  mobility;Decreased safety awareness;Decreased knowledge of precautions;Decreased activity tolerance;Decreased balance;Decreased knowledge of use of DME;Pain       PT Treatment Interventions DME instruction;Therapeutic activities;Therapeutic exercise;Gait training;Balance training;Stair training;Functional mobility training;Patient/family education    PT Goals (Current goals can be found in the Care Plan section)  Acute Rehab PT Goals Patient Stated Goal: to go to [facility] to get better PT Goal Formulation: With patient Time For Goal Achievement: 07/09/21 Potential to Achieve Goals: Good    Frequency 7X/week     Co-evaluation PT/OT/SLP Co-Evaluation/Treatment: Yes Reason for Co-Treatment: Complexity of the patient's impairments (multi-system involvement);For patient/therapist safety;To address functional/ADL transfers PT goals addressed during session: Mobility/safety with mobility;Balance;Proper use of DME OT goals addressed during session: ADL's and self-care;Proper use of Adaptive equipment and DME       AM-PAC PT "6 Clicks" Mobility  Outcome Measure Help needed turning from your back to your side while in a flat bed without using bedrails?: None Help needed moving from lying on your back to sitting on the side of a flat bed without using bedrails?: None Help needed moving to and from a bed to a chair (including a wheelchair)?: A Lot Help needed standing up from a chair using your arms (e.g., wheelchair or bedside chair)?: A Lot Help needed to walk in  hospital room?: A Lot Help needed climbing 3-5 steps with a railing? : A Lot 6 Click Score: 16    End of Session Equipment Utilized During Treatment: Gait belt Activity Tolerance: Patient tolerated treatment well;Patient limited by pain Patient left: in chair;with call bell/phone within reach;with bed alarm set Nurse Communication: Mobility status PT Visit Diagnosis: Unsteadiness on feet (R26.81);Muscle weakness  (generalized) (M62.81);Difficulty in walking, not elsewhere classified (R26.2);Repeated falls (R29.6)    Time: 1430-1450 PT Time Calculation (min) (ACUTE ONLY): 20 min   Charges:   PT Evaluation $PT Eval Low Complexity: 1 Low          Iva Boop, PT  06/25/21. 3:51 PM

## 2021-06-25 NOTE — Evaluation (Signed)
Occupational Therapy Evaluation Patient Details Name: Craig Wilson MRN: [id number] DOB: May 26, 1941 Today's Date: 06/25/2021   History of Present Illness Patient is an 81 year old male who presents to [facility] for frequent falls. Patient has a PMH (+) for for bipolar disorder, prostate cancer status post radical prostatectomy, hypertension, diabetes mellitus. Most frequent fall came when walking boot got caught when patient was trying to stand.  Radiographs in the emergency department showed a minimally displaced left lateral malleolus fracture.  A splint was placed on the left lower extremity.   Clinical Impression   Pt in bed upon OT arrival and eager to participate in therapy Evaluation.  Pt with 2 recent falls at home, sustaining R ankle fx with the first fall, and L ankle fx with the second fall.  Pt is WBAT with walking boot to RLE and NWB to LLE, LLE in long soft splint.  2 person assist to transfer squat pivot bed<>recliner with difficulty maintaining NWB to LLE despite OT providing support to float LLE while PT assisted with balance during transfer.  UI during transfer, with pt able to perform toilet hygiene with set up in sitting.  Prior to fractures, pt was indep with ADLs/IADLs, driving.  Pt hopes to d/c to [facility] for rehab as this is close to his home and spouse is unable to drive.   Pt will benefit from continued skilled OT to maximize indep with self care and functional transfers, instruct in use of AE as needed for ADLs, and reinforce fall prevention strategies.  Pt in agreement with plan.  Pt left in chair with all needs met and call light/phone within reach.      Recommendations for follow up therapy are one component of a multi-disciplinary discharge planning process, led by the attending physician.  Recommendations may be updated based on patient status, additional functional criteria and insurance authorization.   Follow Up Recommendations  Skilled nursing-short term  rehab (<3 hours/day)    Assistance Recommended at Discharge Frequent or constant Supervision/Assistance  Patient can return home with the following Two people to help with walking and/or transfers;Two people to help with bathing/dressing/bathroom;Assistance with cooking/housework;Assist for transportation;Help with stairs or ramp for entrance    Functional Status Assessment  Patient has had a recent decline in their functional status and demonstrates the ability to make significant improvements in function in a reasonable and predictable amount of time.  Equipment Recommendations  Other (comment) (defer to next venue of care)    Recommendations for Other Services  (Pt hopeful to d/c to [facility] as this is 1 mile from is home and spouse is unable to drive)     Precautions / Restrictions Precautions Precautions: Fall Required Braces or Orthoses:  (long soft splint L ankle, R walking boot) Restrictions Weight Bearing Restrictions: Yes RLE Weight Bearing: Weight bearing as tolerated LLE Weight Bearing: Non weight bearing      Mobility Bed Mobility Overal bed mobility: Modified Independent               Patient Response: Cooperative (anxious during transfer but otherwise relaxed upon sitting)  Transfers Overall transfer level: Needs assistance Equipment used: Rolling walker (2 wheels) Transfers: Sit to/from Stand, Bed to chair/wheelchair/BSC Sit to Stand: Mod assist, +2 physical assistance   Squat pivot transfers: Mod assist, +2 physical assistance       General transfer comment: Difficulty maintaining NWB to LLE despite 2nd therapist assisting to float LLE      Balance Overall balance  assessment: Needs assistance Sitting-balance support: No upper extremity supported, Feet supported Sitting balance-Leahy Scale: Good Sitting balance - Comments: can reach outside BOS while maintaining balance   Standing balance support: Bilateral upper extremity supported,  During functional activity, Reliant on assistive device for balance Standing balance-Leahy Scale: Poor                             ADL either performed or assessed with clinical judgement   ADL Overall ADL's : Needs assistance/impaired Eating/Feeding: Independent   Grooming: Set up Grooming Details (indicate cue type and reason): chair level Upper Body Bathing: Set up Upper Body Bathing Details (indicate cue type and reason): chair level         Lower Body Dressing: Maximal assistance;Bed level Lower Body Dressing Details (indicate cue type and reason): Would not be able to attempt in standing d/t inability to maintain NWB to LLE without 1 person assisting with balance while the other person assists with keeping LLE floating       Toileting - Clothing Manipulation Details (indicate cue type and reason): incontinent of urine upon sit to stand and attempting to pivot; set up for hygiene in sitting to clean after UI     Functional mobility during ADLs: +2 for physical assistance;Rolling walker (2 wheels) General ADL Comments: stand pivot with bed elevated transfer bed to recliner and difficulty maintaining NWB to LLE     Vision Baseline Vision/History: 1 Wears glasses (Pt wears glasses all the time but they are at home) Patient Visual Report: No change from baseline                  Pertinent Vitals/Pain Pain Assessment Pain Score: 5  Pain Location: L foot Pain Descriptors / Indicators: Aching, Discomfort, Dull Pain Intervention(s): Limited activity within patient's tolerance, Monitored during session, Repositioned     Hand Dominance     Extremity/Trunk Assessment Upper Extremity Assessment Upper Extremity Assessment: Overall WFL for tasks assessed   Lower Extremity Assessment Lower Extremity Assessment: Generalized weakness       Communication Communication Communication: No difficulties   Cognition Arousal/Alertness: Awake/alert Behavior During  Therapy: WFL for tasks assessed/performed Overall Cognitive Status: Within Functional Limits for tasks assessed                                 General Comments: A&O x3, impaired STM (and pt acknowledges this, though also witnessed throughout session with repeated questions)                      Home Living Family/patient expects to be discharged to:: Private residence Living Arrangements: Spouse/significant other Available Help at Discharge: Family Type of Home: House (ground level condo) Home Access: Stairs to enter Technical brewer of Steps: 1 small step Entrance Stairs-Rails: None Home Layout: One level     Bathroom Shower/Tub: Occupational psychologist: Handicapped height     Home Equipment: Grab bars - toilet;Grab bars - tub/shower;Other (comment) (3 wheeled walker which pt only used in the evenings)          Prior Functioning/Environment Prior Level of Function : Independent/Modified Independent             Mobility Comments: Reports using his 3 wheeled walker/ rollator only at night ADLs Comments: Independent with all ADLs, IADLs, and driving  OT Problem List: Decreased knowledge of use of DME or AE;Impaired balance (sitting and/or standing);Decreased safety awareness;Decreased knowledge of precautions;Pain      OT Treatment/Interventions: Self-care/ADL training;Balance training;Therapeutic exercise;DME and/or AE instruction;Therapeutic activities;Patient/family education    OT Goals(Current goals can be found in the care plan section) Acute Rehab OT Goals Patient Stated Goal: To discharge to [facility] for rehab OT Goal Formulation: With patient Time For Goal Achievement: 07/08/21 Potential to Achieve Goals: Good  OT Frequency: Min 2X/week    Co-evaluation PT/OT/SLP Co-Evaluation/Treatment: Yes Reason for Co-Treatment: Complexity of the patient's impairments (multi-system involvement) PT goals addressed  during session: Mobility/safety with mobility;Balance;Proper use of DME OT goals addressed during session: ADL's and self-care;Proper use of Adaptive equipment and DME      AM-PAC OT "6 Clicks" Daily Activity     Outcome Measure Help from another person eating meals?: None Help from another person taking care of personal grooming?: None Help from another person toileting, which includes using toliet, bedpan, or urinal?: A Lot Help from another person bathing (including washing, rinsing, drying)?: A Little Help from another person to put on and taking off regular upper body clothing?: None Help from another person to put on and taking off regular lower body clothing?: A Lot 6 Click Score: 19   End of Session Equipment Utilized During Treatment: Gait belt;Rolling walker (2 wheels)  Activity Tolerance: Patient limited by pain Patient left: in chair;with call bell/phone within reach  OT Visit Diagnosis: Other abnormalities of gait and mobility (R26.89);Repeated falls (R29.6)                Time: 1292-9090 OT Time Calculation (min): 34 min Charges:  OT General Charges $OT Visit: 1 Visit OT Evaluation $OT Eval Moderate Complexity: 1 Mod OT Treatments $Therapeutic Exercise: 8-22 mins  Leta Speller, MS, OTR/L   Craig Wilson 06/25/2021, 5:36 PM

## 2021-06-25 NOTE — Progress Notes (Signed)
PROGRESS NOTE    Craig Wilson  QZR:[id number] DOB: Mar 23, 1941 DOA: 06/24/2021 PCP: Adin Hector, MD  [id number]   Assessment & Plan:   Principal Problem:   Frequent falls Active Problems:   Parkinson's disease ([place])   Bipolar disorder ([place])   Type 2 diabetes mellitus without complication, without long-term current use of insulin ([place])   Closed bilateral ankle fractures   Craig Wilson is a 81 y.o. male with medical history significant for prostate cancer status post radical prostatectomy, hypertension, diabetes mellitus who presents to the ER for evaluation after a fall.  Patient was seen in the emergency room 10 days ago after a fall and at that time was found to have a closed nondisplaced fracture of his right distal fibula as well as right second and third metatarsals and was discharged home on a walking boot to follow-up with orthopedic surgery as an outpatient. He returns to the ER after another fall at home with inability to ambulate and now has a minimally displaced fracture of the distal left fibula.    Acute left minimally displaced lateral malleolus fracture sustained on 06/24/21 --non-surgical management --nonweightbearing of LLE  Right minimally displaced lateral malleolus fracture with nondisplaced second and third metatarsal base fractures sustained on 06/14/2021 --weightbearing as tolerated, per ortho --podiatry to see, per ortho  Parkinson's disease Continue Sinemet   Diabetes mellitus --not on hypoglycemics at home --BG with morning labs   Bipolar disorder --cont home Lithium   DVT prophylaxis: Lovenox SQ Code Status: Full code  Family Communication:  Level of care: Med-Surg Dispo:   The patient is from: home Anticipated d/c is to: likely SNF Anticipated d/c date is: whenever bed  Patient currently is medically ready to d/c.   Subjective and Interval History:  Pt reported having a hard time getting around with both ankles  fractures.   Objective: Vitals:   06/24/21 1650 06/24/21 2117 06/25/21 0826 06/25/21 1549  BP: (!) 166/68 (!) 154/62 (!) 143/52 (!) 147/73  Pulse: 96 82 80 83  Resp: 18 17 16 16   Temp: 98.6 F (37 C) 99.3 F (37.4 C) 98.5 F (36.9 C) 99 F (37.2 C)  TempSrc: Oral     SpO2: 97% 98% 97% 98%  Weight:      Height:        Intake/Output Summary (Last 24 hours) at 06/25/2021 1551 Last data filed at 06/25/2021 1500 Gross per 24 hour  Intake 720 ml  Output 2050 ml  Net -1330 ml   Filed Weights   06/24/21 1027  Weight: 79 kg    Examination:   Constitutional: NAD, AAOx3 HEENT: conjunctivae and lids normal, EOMI CV: No cyanosis.   RESP: normal respiratory effort, on RA Extremities: right foot in surgical boot, left foot in Ace wrap SKIN: warm, dry Neuro: II - XII grossly intact.   Psych: Normal mood and affect.  Appropriate judgement and reason   Data Reviewed: I have personally reviewed following labs and imaging studies  CBC: Recent Labs  Lab 06/24/21 1648 06/25/21 0531  WBC 9.0 7.7  HGB 9.9* 9.8*  HCT 29.2* 29.5*  MCV 95.1 96.7  PLT 151 354*   Basic Metabolic Panel: Recent Labs  Lab 06/24/21 1648 06/25/21 0531  NA 138 138  K 3.9 4.1  CL 105 107  CO2 27 24  GLUCOSE 160* 197*  BUN 24* 20  CREATININE 0.94 0.81  CALCIUM 9.7 9.6   GFR: Estimated Creatinine Clearance: 65.7 mL/min (by C-G formula  based on SCr of 0.81 mg/dL). Liver Function Tests: No results for input(s): AST, ALT, ALKPHOS, BILITOT, PROT, ALBUMIN in the last 168 hours. No results for input(s): LIPASE, AMYLASE in the last 168 hours. No results for input(s): AMMONIA in the last 168 hours. Coagulation Profile: No results for input(s): INR, PROTIME in the last 168 hours. Cardiac Enzymes: No results for input(s): CKTOTAL, CKMB, CKMBINDEX, TROPONINI in the last 168 hours. BNP (last 3 results) No results for input(s): PROBNP in the last 8760 hours. HbA1C: No results for input(s): HGBA1C in  the last 72 hours. CBG: Recent Labs  Lab 06/25/21 0747 06/25/21 1224 06/25/21 1544  GLUCAP 176* 150* 212*   Lipid Profile: No results for input(s): CHOL, HDL, LDLCALC, TRIG, CHOLHDL, LDLDIRECT in the last 72 hours. Thyroid Function Tests: No results for input(s): TSH, T4TOTAL, FREET4, T3FREE, THYROIDAB in the last 72 hours. Anemia Panel: No results for input(s): VITAMINB12, FOLATE, FERRITIN, TIBC, IRON, RETICCTPCT in the last 72 hours. Sepsis Labs: No results for input(s): PROCALCITON, LATICACIDVEN in the last 168 hours.  No results found for this or any previous visit (from the past 240 hour(s)).    Radiology Studies: DG Ankle Complete Left  Result Date: 06/24/2021 CLINICAL DATA:  81 year old male status post fall with pain and swelling. EXAM: LEFT ANKLE COMPLETE - 3+ VIEW COMPARISON:  None. FINDINGS: Oblique, comminuted fracture of the distal left fibula affecting the metadiaphysis and likely extending to the lateral malleolus. Minimal displacement. Minimal lateral subluxation of the mortise joint. Talar dome intact. No evidence of joint effusion. Distal tibia, calcaneus and other visible bones of the left foot appear intact. IMPRESSION: Oblique, mildly comminuted and minimally displaced fracture of the distal left fibula. Electronically Signed   By: Genevie Ann M.D.   On: 06/24/2021 06:24   DG Ankle Complete Right  Result Date: 06/24/2021 CLINICAL DATA:  Status post fall. EXAM: RIGHT ANKLE - COMPLETE 3+ VIEW COMPARISON:  None. FINDINGS: There is a nondisplaced oblique fracture through the distal fibula. Talar dome appears intact. Regional soft tissues unremarkable. Possible nondisplaced fracture through the anterior tibia. IMPRESSION: Nondisplaced oblique fracture through the distal fibula. Possible nondisplaced fracture through the anterior tibia on lateral view. See dedicated foot radiographs obtained concurrently for description of metatarsal fractures. Electronically Signed   By: Lovey Newcomer M.D.   On: 06/24/2021 15:54   DG Foot Complete Right  Result Date: 06/24/2021 CLINICAL DATA:  Right foot injury.  History of recent fracture. EXAM: RIGHT FOOT COMPLETE - 3+ VIEW COMPARISON:  Right foot x-ray 06/14/2021 FINDINGS: Stable appearing recent nondisplaced fractures at the bases of the second and third metatarsals. No new displacement visualized. No new fracture or dislocation identified in the foot. IMPRESSION: Stable appearing recent nondisplaced fractures at the bases of the second and third metatarsals. Electronically Signed   By: Ofilia Neas M.D.   On: 06/24/2021 15:38   DG Hip Unilat With Pelvis 2-3 Views Left  Result Date: 06/24/2021 CLINICAL DATA:  81 year old male status post fall with pain and swelling. EXAM: DG HIP (WITH OR WITHOUT PELVIS) 2-3V LEFT COMPARISON:  Left hip series 05/06/2021. FINDINGS: Pelvis appears stable and intact. Femoral heads remain normally located. Numerous chronic surgical clips, likely prostatectomy with chronic penile implant. Grossly intact proximal right femur. Stable and intact proximal left femur. No acute osseous abnormality identified. Negative visible bowel gas. IMPRESSION: No acute fracture or dislocation identified about the left hip or pelvis. Electronically Signed   By: Herminio Heads.D.  On: 06/24/2021 06:25     Scheduled Meds:  aspirin EC  81 mg Oral QHS   atorvastatin  40 mg Oral QHS   carbidopa-levodopa  1 tablet Oral QID   enoxaparin (LOVENOX) injection  40 mg Subcutaneous Q24H   lithium carbonate  300 mg Oral BID   rOPINIRole  2 mg Oral QHS   sodium chloride flush  3 mL Intravenous Q12H   traZODone  50-100 mg Oral QHS   Continuous Infusions:  sodium chloride       LOS: 1 day     Enzo Bi, MD [facility] If 7PM-7AM, please contact night-coverage 06/25/2021, 3:51 PM

## 2021-06-26 DIAGNOSIS — R296 Repeated falls: Secondary | ICD-10-CM | POA: Diagnosis not present

## 2021-06-26 LAB — CBC
HCT: 29.2 % — ABNORMAL LOW (ref 39.0–52.0)
Hemoglobin: 9.9 g/dL — ABNORMAL LOW (ref 13.0–17.0)
MCH: 32.5 pg (ref 26.0–34.0)
MCHC: 33.9 g/dL (ref 30.0–36.0)
MCV: 95.7 fL (ref 80.0–100.0)
Platelets: 144 10*3/uL — ABNORMAL LOW (ref 150–400)
RBC: 3.05 MIL/uL — ABNORMAL LOW (ref 4.22–5.81)
RDW: 12.2 % (ref 11.5–15.5)
WBC: 7.3 10*3/uL (ref 4.0–10.5)
nRBC: 0 % (ref 0.0–0.2)

## 2021-06-26 LAB — BASIC METABOLIC PANEL
Anion gap: 8 (ref 5–15)
BUN: 20 mg/dL (ref 8–23)
CO2: 24 mmol/L (ref 22–32)
Calcium: 9.9 mg/dL (ref 8.9–10.3)
Chloride: 106 mmol/L (ref 98–111)
Creatinine, Ser: 0.82 mg/dL (ref 0.61–1.24)
GFR, Estimated: >60 mL/min (ref >60)
Glucose, Bld: 198 mg/dL — ABNORMAL HIGH (ref 70–99)
Potassium: 4.2 mmol/L (ref 3.5–5.1)
Sodium: 138 mmol/L (ref 135–145)

## 2021-06-26 LAB — MAGNESIUM: Magnesium: 2.4 mg/dL (ref 1.7–2.4)

## 2021-06-26 LAB — GLUCOSE, CAPILLARY
Glucose-Capillary: 205 mg/dL — ABNORMAL HIGH (ref 70–99)
Glucose-Capillary: 300 mg/dL — ABNORMAL HIGH (ref 70–99)
Glucose-Capillary: 174 mg/dL — ABNORMAL HIGH (ref 70–99)

## 2021-06-26 NOTE — Progress Notes (Signed)
Physical Therapy Treatment Patient Details Name: Craig Wilson MRN: [id number] DOB: 1941/03/29 Today's Date: 06/26/2021   History of Present Illness Patient is an 81 year old male who presents to [facility] for frequent falls. Patient has a PMH (+) for for bipolar disorder, prostate cancer status post radical prostatectomy, hypertension, diabetes mellitus. Most frequent fall came when walking boot got caught when patient was trying to stand.  Radiographs in the emergency department showed a minimally displaced left lateral malleolus fracture.  A splint was placed on the left lower extremity.    PT Comments    Pt received in bed agreed to PT session. Mod I for bed mobility with use of side rail to transition to edge of bed. Sitting unsupported x 5 minutes. Pt educated on weight bearing restrictions and technique to safely stand and transfer to bedside chair.  Pt required ModA x 1 to stand from raised bed. Incontinent of urine due to condom cath detatched prior to standing. Pt assisted to bedside recliner with ModA to maneuver RW. Increased compliance with NWB L LE with placement of folded towel under foot to prevent weight/pressure. Pt completed therex as stated below. Nursing notified of condom cath and mobility. Pt left with call bell and chair alarm on for safety.    Recommendations for follow up therapy are one component of a multi-disciplinary discharge planning process, led by the attending physician.  Recommendations may be updated based on patient status, additional functional criteria and insurance authorization.  Follow Up Recommendations  Skilled nursing-short term rehab (<3 hours/day)     Assistance Recommended at Discharge    Patient can return home with the following Two people to help with walking and/or transfers;A lot of help with bathing/dressing/bathroom;Assistance with cooking/housework;Direct supervision/assist for medications management;Assist for transportation;Direct  supervision/assist for financial management;Two people to help with bathing/dressing/bathroom;Assistance with feeding;A lot of help with walking and/or transfers   Equipment Recommendations   (To be determined at next venue)    Recommendations for Other Services       Precautions / Restrictions Precautions Precautions: Fall Restrictions Weight Bearing Restrictions: Yes RLE Weight Bearing: Weight bearing as tolerated LLE Weight Bearing: Non weight bearing     Mobility  Bed Mobility Overal bed mobility: Modified Independent             General bed mobility comments:  (use of side rail and increased time)    Transfers Overall transfer level: Needs assistance Equipment used: Rolling walker (2 wheels) Transfers: Sit to/from Stand Sit to Stand: Mod assist Stand pivot transfers: Mod assist         General transfer comment: folded towel placed under L foot during transfers and standing to monitor weight bearing. Pt has difficulty with this task however seemed to be more compliant with towel placement.    Ambulation/Gait                   Stairs             Wheelchair Mobility    Modified Rankin (Stroke Patients Only)       Balance Overall balance assessment: Needs assistance Sitting-balance support: No upper extremity supported, Feet supported Sitting balance-Leahy Scale: Good Sitting balance - Comments: can reach outside BOS while maintaining balance   Standing balance support: Bilateral upper extremity supported, During functional activity, Reliant on assistive device for balance Standing balance-Leahy Scale: Poor  Cognition Arousal/Alertness: Awake/alert Behavior During Therapy: WFL for tasks assessed/performed Overall Cognitive Status: Within Functional Limits for tasks assessed                                 General Comments: A&O x3, impaired STM (and pt acknowledges this, though  also witnessed throughout session with repeated questions)        Exercises General Exercises - Lower Extremity Long Arc Quad: AROM, Both, 10 reps Heel Slides: AROM, Both, 10 reps Hip Flexion/Marching: AROM, Both, 10 reps Other Exercises Other Exercises:  (Condom cath noted to be off once at edge of bed. Pt's nurse notified after session.)    General Comments General comments (skin integrity, edema, etc.): Pt educated on importance of maintaining NWB L LE. Educated on proper use of RW and safety during transfers.      Pertinent Vitals/Pain Pain Assessment Pain Assessment: 0-10 Pain Score: 5  Pain Location: L foot Pain Descriptors / Indicators: Aching, Discomfort, Dull Pain Intervention(s): Monitored during session    Home Living                          Prior Function            PT Goals (current goals can now be found in the care plan section) Acute Rehab PT Goals Patient Stated Goal: to go to [facility] to get better    Frequency    7X/week      PT Plan Current plan remains appropriate    Co-evaluation              AM-PAC PT "6 Clicks" Mobility   Outcome Measure  Help needed turning from your back to your side while in a flat bed without using bedrails?: None Help needed moving from lying on your back to sitting on the side of a flat bed without using bedrails?: None Help needed moving to and from a bed to a chair (including a wheelchair)?: A Lot Help needed standing up from a chair using your arms (e.g., wheelchair or bedside chair)?: A Lot Help needed to walk in hospital room?: A Lot Help needed climbing 3-5 steps with a railing? : A Lot 6 Click Score: 16    End of Session Equipment Utilized During Treatment: Gait belt Activity Tolerance: Patient tolerated treatment well;Patient limited by pain Patient left: in chair;with call bell/phone within reach;with bed alarm set Nurse Communication: Mobility status;Other (comment) (Urine  Incontinence and loss of condom cath connection) PT Visit Diagnosis: Unsteadiness on feet (R26.81);Muscle weakness (generalized) (M62.81);Difficulty in walking, not elsewhere classified (R26.2);Repeated falls (R29.6)     Time: 3875-6433 PT Time Calculation (min) (ACUTE ONLY): 47 min  Charges:  $Therapeutic Exercise: 8-22 mins $Therapeutic Activity: 23-37 mins                    Mikel Cella, PTA   JEMIAH ELLENBURG 06/26/2021, 11:42 AM

## 2021-06-26 NOTE — TOC Progression Note (Signed)
Transition of Care [facility]) - Progression Note    Patient Details  Name: Craig Wilson MRN: [id number] Date of Birth: 10/20/1940  Transition of Care [facility]) CM/SW [place], RN Phone Number: 06/26/2021, 4:30 PM  Clinical Narrative:   Notified leslie at [facility] that the patient is requesting a bed at [facility], I received notice with PASSR [id number] E expires 07/26/21         Expected Discharge Plan and Services                                                 Social Determinants of Health (SDOH) Interventions    Readmission Risk Interventions No flowsheet data found.

## 2021-06-26 NOTE — TOC Progression Note (Signed)
Transition of Care [facility]) - Progression Note    Patient Details  Name: Craig Wilson MRN: [id number] Date of Birth: [birth year]  Transition of Care [facility]) CM/SW [place], RN Phone Number: 06/26/2021, 4:00 PM  Clinical Narrative:    Sent the requested documents to [facility] to obtain PASSr, including, 30 day note, Fl2, Face sheet, Progress notes and therapy notes        Expected Discharge Plan and Services                                                 Social Determinants of Health (SDOH) Interventions    Readmission Risk Interventions No flowsheet data found.

## 2021-06-26 NOTE — Progress Notes (Signed)
Physical Therapy Treatment Patient Details Name: Craig Wilson MRN: [id number] DOB: February 19, 1941 Today's Date: 06/26/2021   History of Present Illness Patient is an 81 year old male who presents to [facility] for frequent falls. Patient has a PMH (+) for for bipolar disorder, prostate cancer status post radical prostatectomy, hypertension, diabetes mellitus. Most frequent fall came when walking boot got caught when patient was trying to stand.  Radiographs in the emergency department showed a minimally displaced left lateral malleolus fracture.  A splint was placed on the left lower extremity.    PT Comments    RN asking PT to assist with transfer back to bed; CNA in room. Pt in recliner with condom cath malfunction and BM in chair. PT assisted pt with mobility aspect as CNA provided total assist for pericare. Pt required MOD A to stand from recliner, MIN A to maintain static standing during pericare and MOD A +2 to stand pivot from recliner to bed. Pt had difficulty following commands during transfer and following NWB status of LLE. Frequent VC to remind pt of NWB. Would benefit from skilled PT to address above deficits and promote optimal return to PLOF.   Recommendations for follow up therapy are one component of a multi-disciplinary discharge planning process, led by the attending physician.  Recommendations may be updated based on patient status, additional functional criteria and insurance authorization.  Follow Up Recommendations  Skilled nursing-short term rehab (<3 hours/day)     Assistance Recommended at Discharge Intermittent Supervision/Assistance  Patient can return home with the following Two people to help with walking and/or transfers;Assistance with cooking/housework;Direct supervision/assist for medications management;Assist for transportation;Direct supervision/assist for financial management;Two people to help with bathing/dressing/bathroom;Assistance with feeding   Equipment  Recommendations  Other (comment) (To be determined at next venue)    Recommendations for Other Services       Precautions / Restrictions Precautions Precautions: Fall Restrictions Weight Bearing Restrictions: Yes RLE Weight Bearing: Weight bearing as tolerated LLE Weight Bearing: Non weight bearing     Mobility  Bed Mobility Overal bed mobility: Needs Assistance Bed Mobility: Sit to Supine       Sit to supine: Mod assist   General bed mobility comments: CNA provided MOD A to manage BLE    Transfers Overall transfer level: Needs assistance Equipment used: Rolling walker (2 wheels) Transfers: Sit to/from Stand, Bed to chair/wheelchair/BSC Sit to Stand: Mod assist Stand pivot transfers: Mod assist, +2 physical assistance, +2 safety/equipment         General transfer comment: STS using RW, MOD A to lift from recliner and provide heavy steadying assist standing. Stand pivot transfer recliner>bed, PT managing RW, maintaining pillow under LLE to gage NWB status and +2 to provide steadying assist as pt pivots .    Ambulation/Gait               General Gait Details: deferred - difficulty maintaining NWB precaution   Stairs             Wheelchair Mobility    Modified Rankin (Stroke Patients Only)       Balance Overall balance assessment: Needs assistance Sitting-balance support: No upper extremity supported, Feet supported Sitting balance-Leahy Scale: Good Sitting balance - Comments: can reach outside BOS while maintaining balance   Standing balance support: Bilateral upper extremity supported, During functional activity, Reliant on assistive device for balance Standing balance-Leahy Scale: Poor Standing balance comment: Heavy steadying of +2 during stand pivot  Cognition Arousal/Alertness: Awake/alert Behavior During Therapy: WFL for tasks assessed/performed Overall Cognitive Status: Within Functional Limits  for tasks assessed                                 General Comments: Agreeable throughout. Difficulty following multi-step commands and maintaining precautions.        Exercises      General Comments        Pertinent Vitals/Pain Pain Assessment Pain Assessment: Faces Faces Pain Scale: Hurts little more Pain Location: L foot Pain Descriptors / Indicators: Aching, Discomfort, Dull Pain Intervention(s): Monitored during session    Home Living                          Prior Function            PT Goals (current goals can now be found in the care plan section) Acute Rehab PT Goals Patient Stated Goal: to go to [facility] to get better    Frequency    7X/week      PT Plan      Co-evaluation              AM-PAC PT "6 Clicks" Mobility   Outcome Measure  Help needed turning from your back to your side while in a flat bed without using bedrails?: None Help needed moving from lying on your back to sitting on the side of a flat bed without using bedrails?: None Help needed moving to and from a bed to a chair (including a wheelchair)?: A Lot Help needed standing up from a chair using your arms (e.g., wheelchair or bedside chair)?: A Lot Help needed to walk in hospital room?: A Lot Help needed climbing 3-5 steps with a railing? : A Lot 6 Click Score: 16    End of Session Equipment Utilized During Treatment: Gait belt Activity Tolerance: Patient tolerated treatment well;Patient limited by pain Patient left: with call bell/phone within reach;with bed alarm set;in bed;with nursing/sitter in room Nurse Communication: Mobility status PT Visit Diagnosis: Unsteadiness on feet (R26.81);Muscle weakness (generalized) (M62.81);Difficulty in walking, not elsewhere classified (R26.2);Repeated falls (R29.6)     Time: 5462-7035 PT Time Calculation (min) (ACUTE ONLY): 16 min  Charges:  $Therapeutic Activity: 8-22 mins                      Patrina Levering PT, DPT 06/26/21 4:34 PM [phone]

## 2021-06-26 NOTE — NC FL2 (Addendum)
[place] LEVEL OF CARE SCREENING TOOL     IDENTIFICATION  Patient Name: Craig Wilson Birthdate: 1940-07-06 Sex: male Admission Date (Current Location): 06/24/2021  [facility] and [place] Number:  Engineering geologist and Address:  [facility], [address] [place], [name] [address]      Provider Number: [id number]  Attending Physician Name and Address:  Enzo Bi, MD  Relative Name and Phone Number:  Lelon Mast [phone]    Current Level of Care: Hospital Recommended Level of Care: [place] Prior Approval Number:    Date Approved/Denied:   PASRR Number: [id number]  Discharge Plan: SNF    Current Diagnoses: Patient Active Problem List   Diagnosis Date Noted   Frequent falls 06/24/2021   Closed bilateral ankle fractures 06/24/2021   Acute respiratory failure with hypoxia and hypercapnia (HCC)    Altered mental status    Sepsis due to pneumonia ([place]) 11/05/2020   Acute kidney injury ([place]) 10/31/2020   Bladder outlet obstruction 10/31/2020   Hyponatremia 10/31/2020   Acute cystitis without hematuria 10/31/2020   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 04/27/2020   OSA (obstructive sleep apnea) 11/03/2019   RBBB (right bundle branch block) 11/03/2019   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   Other emphysema ([place]) 06/27/2017   Acquired trigger finger 04/10/2017   Elevated coronary artery calcium score 03/04/2017   Sepsis ([place]) 07/01/2016   CAP (community acquired pneumonia) 07/01/2016   Influenza A 07/01/2016   DDD (degenerative disc disease), lumbar 03/11/2016   Senile purpura ([place]) 03/11/2016   Spiradenoma 12/01/2015   Mass of left side of neck 10/19/2015   Thrombocytopenia ([place]) 09/07/2015   Bilateral edema of lower extremity 05/10/2015   Bipolar disorder, in partial remission, most recent episode depressed ([place]) 05/10/2015   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[place]) 05/10/2015   Edema of  lower extremity 05/10/2015   RLS (restless legs syndrome) 03/29/2015   Adenomatous polyp 02/24/2015   Anemia 02/24/2015   Bipolar disorder ([place]) 02/24/2015   Chronic diarrhea 02/24/2015   Diabetic neuropathy ([place]) 02/24/2015   Arthritis, degenerative 02/24/2015   Arthritis urica 02/24/2015   BP (high blood pressure) 02/24/2015   CA of prostate ([place]) 02/24/2015   Incontinence 02/01/2015   History of prostate cancer 02/01/2015   Gross hematuria 02/01/2015   SOB (shortness of breath) 12/16/2014   Pain in the chest 12/16/2014   Family history of premature CAD 12/16/2014   Parkinson's disease ([place]) 12/16/2014   Essential hypertension 12/16/2014   Unsteady gait 12/16/2014   Hyperlipidemia 12/16/2014   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[place]) 11/23/2014   Difficulty in walking 10/10/2014   Difficulty sleeping 10/10/2014   Breath shortness 05/06/2014   Amnesia 02/28/2014   Type 2 diabetes mellitus without complication, without long-term current use of insulin ([place]) 02/15/2014   Has a tremor 02/01/2006   Tremor 02/01/2006    Orientation RESPIRATION BLADDER Height & Weight     Self, Time, Situation, Place  Normal Continent Weight: 79 kg Height:  5\' 7"  (170.2 cm)  BEHAVIORAL SYMPTOMS/MOOD NEUROLOGICAL BOWEL NUTRITION STATUS      Continent Diet (regular)  AMBULATORY STATUS COMMUNICATION OF NEEDS Skin   Extensive Assist Verbally Normal                       Personal Care Assistance Level of Assistance  Bathing, Feeding, Dressing Bathing Assistance: Maximum assistance Feeding assistance: Independent Dressing Assistance: Maximum assistance     Functional Limitations Info  SPECIAL CARE FACTORS FREQUENCY  PT (By licensed PT), OT (By licensed OT)     PT Frequency: 5 times per week OT Frequency: 5 tmies per week            Contractures Contractures Info: Not present    Additional Factors Info                  Current Medications (06/26/2021):  This  is the current hospital active medication list Current Facility-Administered Medications  Medication Dose Route Frequency Provider Last Rate Last Admin   0.9 %  sodium chloride infusion  250 mL Intravenous PRN Agbata, Tochukwu, MD       aspirin EC tablet 81 mg  81 mg Oral QHS Agbata, Tochukwu, MD   81 mg at 06/25/21 2252   atorvastatin (LIPITOR) tablet 40 mg  40 mg Oral QHS Agbata, Tochukwu, MD   40 mg at 06/25/21 2251   carbidopa-levodopa (SINEMET IR) 25-100 MG per tablet immediate release 1 tablet  1 tablet Oral QID Agbata, Tochukwu, MD   1 tablet at 06/26/21 0835   enoxaparin (LOVENOX) injection 40 mg  40 mg Subcutaneous Q24H Agbata, Tochukwu, MD   40 mg at 06/25/21 2256   HYDROcodone-acetaminophen (NORCO/VICODIN) 5-325 MG per tablet 1 tablet  1 tablet Oral Q6H PRN Agbata, Tochukwu, MD   1 tablet at 06/25/21 1226   lithium carbonate capsule 300 mg  300 mg Oral BID Agbata, Tochukwu, MD   300 mg at 06/26/21 0837   ondansetron (ZOFRAN) tablet 4 mg  4 mg Oral Q6H PRN Agbata, Tochukwu, MD       Or   ondansetron (ZOFRAN) injection 4 mg  4 mg Intravenous Q6H PRN Agbata, Tochukwu, MD       polyethylene glycol (MIRALAX / GLYCOLAX) packet 17 g  17 g Oral Daily PRN Agbata, Tochukwu, MD       rOPINIRole (REQUIP) tablet 2 mg  2 mg Oral QHS Agbata, Tochukwu, MD   2 mg at 06/25/21 2252   sodium chloride flush (NS) 0.9 % injection 3 mL  3 mL Intravenous Q12H Agbata, Tochukwu, MD   3 mL at 06/25/21 0908   sodium chloride flush (NS) 0.9 % injection 3 mL  3 mL Intravenous PRN Agbata, Tochukwu, MD       traZODone (DESYREL) tablet 50-100 mg  50-100 mg Oral QHS Agbata, Tochukwu, MD   100 mg at 06/25/21 2252     Discharge Medications: Please see discharge summary for a list of discharge medications.  Relevant Imaging Results:  Relevant Lab Results:   Additional Information SS#: [id number]. Fully vaccinated. Pfizer: 07/29/19, 08/25/19, 02/29/20.  Conception Oms, RN

## 2021-06-26 NOTE — Progress Notes (Addendum)
PROGRESS NOTE    Craig Wilson  OXB:[id number] DOB: 1940/12/09 DOA: 06/24/2021 PCP: Adin Hector, MD  [id number]   Assessment & Plan:   Principal Problem:   Frequent falls Active Problems:   Parkinson's disease ([place])   Bipolar disorder ([place])   Type 2 diabetes mellitus without complication, without long-term current use of insulin ([place])   Closed bilateral ankle fractures   Craig Wilson is a 81 y.o. male with medical history significant for prostate cancer status post radical prostatectomy, hypertension, diabetes mellitus who presents to the ER for evaluation after a fall.  Patient was seen in the emergency room 10 days ago after a fall and at that time was found to have a closed nondisplaced fracture of his right distal fibula as well as right second and third metatarsals and was discharged home on a walking boot to follow-up with orthopedic surgery as an outpatient. He returns to the ER after another fall at home with inability to ambulate and now has a minimally displaced fracture of the distal left fibula.    Acute left minimally displaced lateral malleolus fracture sustained on 06/24/21 --non-surgical management --nonweightbearing of LLE  Right minimally displaced lateral malleolus fracture with nondisplaced second and third metatarsal base fractures sustained on 06/14/2021 --weightbearing as tolerated, per ortho --discussed with podiatry, who will follow up with pt as outpatient.  Parkinson's disease Continue Sinemet   Diabetes mellitus type 2 with hyperglycemia --not on hypoglycemics at home.  No recent A1c. --obtain A1c --SSI ACHS for now   Bipolar disorder --cont home Lithium   DVT prophylaxis: Lovenox SQ Code Status: Full code  Family Communication:  Level of care: Med-Surg Dispo:   The patient is from: home Anticipated d/c is to: SNF Anticipated d/c date is: whenever bed  Patient currently is medically ready to d/c.   Subjective and  Interval History:  Worked with PT today, noted to have trouble not putting weight on his left foot.   Objective: Vitals:   06/26/21 0748 06/26/21 1208 06/26/21 1502 06/26/21 1821  BP: (!) 148/61 (!) 139/56 (!) 128/55   Pulse: 75 90 98   Resp: 16 14 18    Temp: 98.1 F (36.7 C) 99.8 F (37.7 C) (!) 100.8 F (38.2 C) 99.3 F (37.4 C)  TempSrc:      SpO2: 97% 96% 95%   Weight:      Height:        Intake/Output Summary (Last 24 hours) at 06/26/2021 1832 Last data filed at 06/26/2021 1300 Gross per 24 hour  Intake 480 ml  Output 950 ml  Net -470 ml   Filed Weights   06/24/21 1027  Weight: 79 kg    Examination:   Constitutional: NAD, AAOx3 HEENT: conjunctivae and lids normal, EOMI CV: No cyanosis.   RESP: normal respiratory effort, on RA Extremities: right foot in surgical boot, left foot in ACE wrap SKIN: warm, dry Neuro: II - XII grossly intact.   Psych: Normal mood and affect.  Appropriate judgement and reason   Data Reviewed: I have personally reviewed following labs and imaging studies  CBC: Recent Labs  Lab 06/24/21 1648 06/25/21 0531 06/26/21 0516  WBC 9.0 7.7 7.3  HGB 9.9* 9.8* 9.9*  HCT 29.2* 29.5* 29.2*  MCV 95.1 96.7 95.7  PLT 151 141* 683*   Basic Metabolic Panel: Recent Labs  Lab 06/24/21 1648 06/25/21 0531 06/26/21 0516  NA 138 138 138  K 3.9 4.1 4.2  CL 105 107 106  CO2 27 24 24   GLUCOSE 160* 197* 198*  BUN 24* 20 20  CREATININE 0.94 0.81 0.82  CALCIUM 9.7 9.6 9.9  MG  --   --  2.4   GFR: Estimated Creatinine Clearance: 64.9 mL/min (by C-G formula based on SCr of 0.82 mg/dL). Liver Function Tests: No results for input(s): AST, ALT, ALKPHOS, BILITOT, PROT, ALBUMIN in the last 168 hours. No results for input(s): LIPASE, AMYLASE in the last 168 hours. No results for input(s): AMMONIA in the last 168 hours. Coagulation Profile: No results for input(s): INR, PROTIME in the last 168 hours. Cardiac Enzymes: No results for input(s):  CKTOTAL, CKMB, CKMBINDEX, TROPONINI in the last 168 hours. BNP (last 3 results) No results for input(s): PROBNP in the last 8760 hours. HbA1C: No results for input(s): HGBA1C in the last 72 hours. CBG: Recent Labs  Lab 06/25/21 0747 06/25/21 1224 06/25/21 1544 06/26/21 1210 06/26/21 1642  GLUCAP 176* 150* 212* 205* 300*   Lipid Profile: No results for input(s): CHOL, HDL, LDLCALC, TRIG, CHOLHDL, LDLDIRECT in the last 72 hours. Thyroid Function Tests: No results for input(s): TSH, T4TOTAL, FREET4, T3FREE, THYROIDAB in the last 72 hours. Anemia Panel: No results for input(s): VITAMINB12, FOLATE, FERRITIN, TIBC, IRON, RETICCTPCT in the last 72 hours. Sepsis Labs: No results for input(s): PROCALCITON, LATICACIDVEN in the last 168 hours.  No results found for this or any previous visit (from the past 240 hour(s)).    Radiology Studies: No results found.   Scheduled Meds:  aspirin EC  81 mg Oral QHS   atorvastatin  40 mg Oral QHS   carbidopa-levodopa  1 tablet Oral QID   enoxaparin (LOVENOX) injection  40 mg Subcutaneous Q24H   lithium carbonate  300 mg Oral BID   rOPINIRole  2 mg Oral QHS   sodium chloride flush  3 mL Intravenous Q12H   traZODone  50-100 mg Oral QHS   Continuous Infusions:  sodium chloride       LOS: 2 days     Enzo Bi, MD [facility] If 7PM-7AM, please contact night-coverage 06/26/2021, 6:32 PM

## 2021-06-26 NOTE — Progress Notes (Signed)
The above mentioned patient requires Short term Rehabilitation fr strengthening.  He is expected top return home after a less than 30 day stay in a short term rehabilitation Nursing facility.

## 2021-06-27 DIAGNOSIS — S82891A Other fracture of right lower leg, initial encounter for closed fracture: Secondary | ICD-10-CM

## 2021-06-27 DIAGNOSIS — F319 Bipolar disorder, unspecified: Secondary | ICD-10-CM

## 2021-06-27 DIAGNOSIS — S82892A Other fracture of left lower leg, initial encounter for closed fracture: Secondary | ICD-10-CM

## 2021-06-27 LAB — GLUCOSE, CAPILLARY
Glucose-Capillary: 220 mg/dL — ABNORMAL HIGH (ref 70–99)
Glucose-Capillary: 154 mg/dL — ABNORMAL HIGH (ref 70–99)
Glucose-Capillary: 143 mg/dL — ABNORMAL HIGH (ref 70–99)
Glucose-Capillary: 251 mg/dL — ABNORMAL HIGH (ref 70–99)
Glucose-Capillary: 206 mg/dL — ABNORMAL HIGH (ref 70–99)

## 2021-06-27 LAB — RESP PANEL BY RT-PCR (FLU A&B, COVID) ARPGX2
Influenza A by PCR: NEGATIVE
Influenza B by PCR: NEGATIVE
SARS Coronavirus 2 by RT PCR: NEGATIVE

## 2021-06-27 MED ORDER — INSULIN ASPART 100 UNIT/ML IJ SOLN
0.0000 [IU] | Freq: Every day | INTRAMUSCULAR | Status: DC
  Administered 2021-06-27: 22:00:00 2 [IU] via SUBCUTANEOUS
  Filled 2021-06-27: qty 1

## 2021-06-27 MED ORDER — HYDROCODONE-ACETAMINOPHEN 5-325 MG PO TABS: 1.0000 | ORAL_TABLET | Freq: Four times a day (QID) | ORAL | 0 refills | Status: DC | PRN

## 2021-06-27 MED ORDER — INSULIN ASPART 100 UNIT/ML IJ SOLN
0.0000 [IU] | Freq: Three times a day (TID) | INTRAMUSCULAR | Status: DC
  Administered 2021-06-27: 14:00:00 3 [IU] via SUBCUTANEOUS
  Administered 2021-06-27: 09:00:00 5 [IU] via SUBCUTANEOUS
  Administered 2021-06-27: 18:00:00 2 [IU] via SUBCUTANEOUS
  Administered 2021-06-28 (×2): 3 [IU] via SUBCUTANEOUS
  Filled 2021-06-27 (×5): qty 1

## 2021-06-27 NOTE — Progress Notes (Signed)
Physical Therapy Treatment Patient Details Name: Craig Wilson MRN: [id number] DOB: 05/11/1941 Today's Date: 06/27/2021   History of Present Illness Patient is an 81 year old male who presents to [facility] for frequent falls. Patient has a PMH (+) for for bipolar disorder, prostate cancer status post radical prostatectomy, hypertension, diabetes mellitus. Most frequent fall came when walking boot got caught when patient was trying to stand.  Radiographs in the emergency department showed a minimally displaced left lateral malleolus fracture.  A splint was placed on the left lower extremity.    PT Comments    Patient tolerated session well and was agreeable to treatment. Patient reports no pain throughout session however does reports discomfort/pain when he tries to turn his LLE. Patient continues to have difficulty maintaining his NWB precautions on the LLE, and required continued education on how to maintain. Supine bed exercises focused on BLE strengthening. Patient demonstrated increased ability to perform bed to chair transfer with Min A of 1 at the waist and RW (with pillow under LLE to help maintain WBing precautions). Patient required continued cueing on putting weight through the arms to offload weight-bearing through the legs to increase ease with transfer. Patient is progressing towards his goals, and would continue to benefit from skilled physical therapy in order to optimize return to PLOF. Current plan of STR remains appropriate post discharge from acute hospitalization.   Recommendations for follow up therapy are one component of a multi-disciplinary discharge planning process, led by the attending physician.  Recommendations may be updated based on patient status, additional functional criteria and insurance authorization.  Follow Up Recommendations  Skilled nursing-short term rehab (<3 hours/day)     Assistance Recommended at Discharge Intermittent Supervision/Assistance  Patient can  return home with the following Two people to help with walking and/or transfers;Assistance with cooking/housework;Direct supervision/assist for medications management;Assist for transportation;Direct supervision/assist for financial management;Two people to help with bathing/dressing/bathroom;Assistance with feeding   Equipment Recommendations  Other (comment) (defer to next level of care)    Recommendations for Other Services       Precautions / Restrictions Precautions Precautions: Fall Restrictions Weight Bearing Restrictions: Yes RLE Weight Bearing: Weight bearing as tolerated LLE Weight Bearing: Non weight bearing     Mobility  Bed Mobility Overal bed mobility: Modified Independent                  Transfers Overall transfer level: Needs assistance Equipment used: Rolling walker (2 wheels) Transfers: Sit to/from Stand, Bed to chair/wheelchair/BSC Sit to Stand: Min assist (from elevated surface) Stand pivot transfers: Min assist (with max cueing to put weight through hands to off-load RLE to increase ease with pivot transfer, Min A at the walker to help rotate)         General transfer comment: STS using RW, Stand pivot transfer bed> recliner, PT managing RW, maintaining pillow under LLE to gage NWB status    Ambulation/Gait Ambulation/Gait assistance:  (refer due to safety)             General Gait Details: deferred - difficulty maintaining NWB precaution   Stairs             Wheelchair Mobility    Modified Rankin (Stroke Patients Only)       Balance Overall balance assessment: Needs assistance Sitting-balance support: No upper extremity supported, Feet supported Sitting balance-Leahy Scale: Good Sitting balance - Comments: can reach outside BOS while maintaining balance   Standing balance support: Bilateral upper extremity supported, During functional  activity, Reliant on assistive device for balance Standing balance-Leahy Scale:  Poor Standing balance comment: Heavy steadying; difficulty maintaing NMW precautions on LLE                            Cognition Arousal/Alertness: Awake/alert Behavior During Therapy: WFL for tasks assessed/performed Overall Cognitive Status: Within Functional Limits for tasks assessed                                 General Comments: Agreeable throughout. Difficulty following multi-step commands and maintaining precautions.        Exercises General Exercises - Lower Extremity Heel Slides: Supine, AROM, Both, 10 reps Hip ABduction/ADduction: Supine, AROM, Both, 10 reps Straight Leg Raises: Supine, AROM, Both, 10 reps    General Comments        Pertinent Vitals/Pain Pain Assessment Pain Assessment: 0-10 Pain Score: 0-No pain Faces Pain Scale: Hurts a little bit Pain Location: L foot, only when "i try to rotate my L foot" Pain Descriptors / Indicators: Aching, Discomfort, Dull Pain Intervention(s): Limited activity within patient's tolerance, Monitored during session, Repositioned    Home Living                          Prior Function            PT Goals (current goals can now be found in the care plan section) Acute Rehab PT Goals Patient Stated Goal: to go to [facility] to get better PT Goal Formulation: With patient Time For Goal Achievement: 07/09/21 Potential to Achieve Goals: Good Progress towards PT goals: Progressing toward goals    Frequency    7X/week      PT Plan Current plan remains appropriate    Co-evaluation              AM-PAC PT "6 Clicks" Mobility   Outcome Measure  Help needed turning from your back to your side while in a flat bed without using bedrails?: None Help needed moving from lying on your back to sitting on the side of a flat bed without using bedrails?: None Help needed moving to and from a bed to a chair (including a wheelchair)?: A Lot Help needed standing up from a chair  using your arms (e.g., wheelchair or bedside chair)?: A Lot Help needed to walk in hospital room?: A Lot Help needed climbing 3-5 steps with a railing? : A Lot 6 Click Score: 16    End of Session Equipment Utilized During Treatment: Gait belt Activity Tolerance: Patient tolerated treatment well;Patient limited by pain Patient left: with call bell/phone within reach;with bed alarm set;in chair Nurse Communication: Mobility status PT Visit Diagnosis: Unsteadiness on feet (R26.81);Muscle weakness (generalized) (M62.81);Difficulty in walking, not elsewhere classified (R26.2);Repeated falls (R29.6)     Time: 3612-2449 PT Time Calculation (min) (ACUTE ONLY): 22 min  Charges:  $Therapeutic Activity: 8-22 mins                     Iva Boop, PT  06/27/21. 11:10 AM

## 2021-06-27 NOTE — TOC Progression Note (Addendum)
Transition of Care [facility]) - Progression Note    Patient Details  Name: Craig Wilson MRN: [id number] Date of Birth: [birth year]  Transition of Care [facility]) CM/SW [place], RN Phone Number: 06/27/2021, 2:06 PM  Clinical Narrative:   Ins approved to go to [facility] [id number] 1/25-1/27  Magda Paganini at [facility] stated that they will not be able to accept until tomorrow, Covid test needed, and ordered       Expected Discharge Plan and Services                                                 Social Determinants of Health (SDOH) Interventions    Readmission Risk Interventions No flowsheet data found.

## 2021-06-27 NOTE — Care Management Important Message (Signed)
Important Message  Patient Details  Name: Craig Wilson MRN: [id number] Date of Birth: March 09, 1941   Medicare Important Message Given:  Yes     Juliann Pulse A Airlie Blumenberg 06/27/2021, 10:33 AM

## 2021-06-27 NOTE — Progress Notes (Signed)
PROGRESS NOTE    Craig Wilson  YIR:[id number] DOB: 06/25/1940 DOA: 06/24/2021 PCP: Adin Hector, MD    Brief Narrative:  Craig Wilson is a 81 y.o. male with medical history significant for prostate cancer status post radical prostatectomy, hypertension, diabetes mellitus who presents to the ER for evaluation after a fall.  Patient was seen in the emergency room 10 days ago after a fall and at that time was found to have a closed nondisplaced fracture of his right distal fibula as well as right second and third metatarsals and was discharged home on a walking boot to follow-up with orthopedic surgery as an outpatient. He returns to the ER after another fall at home with inability to ambulate and now has a minimally displaced fracture of the distal left fibula.    Assessment & Plan:   Principal Problem:   Frequent falls Active Problems:   Parkinson's disease ([place])   Bipolar disorder ([place])   Type 2 diabetes mellitus without complication, without long-term current use of insulin (HCC)   Closed bilateral ankle fractures  Acute left minimally displaced lateral malleolus fracture sustained on 06/24/21 Evaluated by orthopedics, immobilized left ankle, no weightbearing on left leg.  Continue pain medicine. No need for surgery. Patient has been accepted for rehab, but no bed available today.  We will discharge tomorrow.   Right minimally displaced lateral malleolus fracture with nondisplaced second and third metatarsal base fractures sustained on 06/14/2021 Weightbearing as tolerated   Parkinson's disease Continue Sinemet   Diabetes mellitus type 2 with hyperglycemia Continue current treatment.  Bipolar disorder --cont home Lithium  Low-grade fever  The low-grade fever yesterday evening, this potentially is secondary to ankle fracture.  No evidence of infection.  COVID test pending.  DVT prophylaxis: Lovenox Code Status: full Family Communication:  Disposition Plan:     Status is: Inpatient  Remains inpatient appropriate because: Unsafe discharge.        I/O last 3 completed shifts: In: 483 [P.O.:480; I.V.:3] Out: 4150 [Urine:4150] No intake/output data recorded.     Consultants:  orthopedics  Procedures: None  Antimicrobials: None  Subjective: Patient still have bilateral ankle pain which is controlled with pain medicine. Denies any abdominal pain nausea vomiting. No short of breath or cough. No fever today, had recorded low-grade fever yesterday evening.  Objective: Vitals:   06/27/21 0537 06/27/21 0715 06/27/21 1115 06/27/21 1230  BP: (!) 148/64 133/60 139/61 (!) 161/62  Pulse: 82 76 76 72  Resp: 20 14 16 16   Temp: 98.2 F (36.8 C) 98.4 F (36.9 C) 98.4 F (36.9 C) 97.8 F (36.6 C)  TempSrc:  Oral Oral   SpO2: 95% 96% 99% 99%  Weight:      Height:        Intake/Output Summary (Last 24 hours) at 06/27/2021 1433 Last data filed at 06/27/2021 0536 Gross per 24 hour  Intake 3 ml  Output 3200 ml  Net -3197 ml   Filed Weights   06/24/21 1027  Weight: 79 kg    Examination:  General exam: Appears calm and comfortable  Respiratory system: Clear to auscultation. Respiratory effort normal. Cardiovascular system: S1 & S2 heard, RRR. No JVD, murmurs, rubs, gallops or clicks. No pedal edema. Gastrointestinal system: Abdomen is nondistended, soft and nontender. No organomegaly or masses felt. Normal bowel sounds heard. Central nervous system: Alert and oriented. No focal neurological deficits. Extremities: Symmetric 5 x 5 power. Skin: No rashes, lesions or ulcers Psychiatry: Judgement and insight appear  normal. Mood & affect appropriate.     Data Reviewed: I have personally reviewed following labs and imaging studies  CBC: Recent Labs  Lab 06/24/21 1648 06/25/21 0531 06/26/21 0516  WBC 9.0 7.7 7.3  HGB 9.9* 9.8* 9.9*  HCT 29.2* 29.5* 29.2*  MCV 95.1 96.7 95.7  PLT 151 141* 767*   Basic Metabolic  Panel: Recent Labs  Lab 06/24/21 1648 06/25/21 0531 06/26/21 0516  NA 138 138 138  K 3.9 4.1 4.2  CL 105 107 106  CO2 27 24 24   GLUCOSE 160* 197* 198*  BUN 24* 20 20  CREATININE 0.94 0.81 0.82  CALCIUM 9.7 9.6 9.9  MG  --   --  2.4   GFR: Estimated Creatinine Clearance: 64.9 mL/min (by C-G formula based on SCr of 0.82 mg/dL). Liver Function Tests: No results for input(s): AST, ALT, ALKPHOS, BILITOT, PROT, ALBUMIN in the last 168 hours. No results for input(s): LIPASE, AMYLASE in the last 168 hours. No results for input(s): AMMONIA in the last 168 hours. Coagulation Profile: No results for input(s): INR, PROTIME in the last 168 hours. Cardiac Enzymes: No results for input(s): CKTOTAL, CKMB, CKMBINDEX, TROPONINI in the last 168 hours. BNP (last 3 results) No results for input(s): PROBNP in the last 8760 hours. HbA1C: No results for input(s): HGBA1C in the last 72 hours. CBG: Recent Labs  Lab 06/26/21 1210 06/26/21 1642 06/26/21 2220 06/27/21 0834 06/27/21 1237  GLUCAP 205* 300* 174* 220* 154*   Lipid Profile: No results for input(s): CHOL, HDL, LDLCALC, TRIG, CHOLHDL, LDLDIRECT in the last 72 hours. Thyroid Function Tests: No results for input(s): TSH, T4TOTAL, FREET4, T3FREE, THYROIDAB in the last 72 hours. Anemia Panel: No results for input(s): VITAMINB12, FOLATE, FERRITIN, TIBC, IRON, RETICCTPCT in the last 72 hours. Sepsis Labs: No results for input(s): PROCALCITON, LATICACIDVEN in the last 168 hours.  No results found for this or any previous visit (from the past 240 hour(s)).       Radiology Studies: No results found.      Scheduled Meds:  aspirin EC  81 mg Oral QHS   atorvastatin  40 mg Oral QHS   carbidopa-levodopa  1 tablet Oral QID   enoxaparin (LOVENOX) injection  40 mg Subcutaneous Q24H   insulin aspart  0-15 Units Subcutaneous TID WC   insulin aspart  0-5 Units Subcutaneous QHS   lithium carbonate  300 mg Oral BID   rOPINIRole  2 mg  Oral QHS   sodium chloride flush  3 mL Intravenous Q12H   traZODone  50-100 mg Oral QHS   Continuous Infusions:  sodium chloride       LOS: 3 days    Time spent: 27 minutes    Sharen Hones, MD [facility]   To contact the attending provider between 7A-7P or the covering provider during after hours 7P-7A, please log into the web site [email or web address] and access using universal [place] password for that web site. If you do not have the password, please call the hospital operator.  06/27/2021, 2:33 PM

## 2021-06-27 NOTE — TOC Progression Note (Signed)
Transition of Care [facility]) - Progression Note    Patient Details  Name: Craig Wilson MRN: [id number] Date of Birth: [birth year]  Transition [facility]) CM/SW [place], RN Phone Number: 06/27/2021, 11:22 AM  Clinical Narrative:      Reviewed the bed offers with the patient, he accepted the bed offer from [facility], he stated that for safety he wanted EMS to transport, I reached out to [facility] and initiated the ins approval process, Ref number [id number], awaiting approval      Expected Discharge Plan and Services                                                 Social Determinants of Health (SDOH) Interventions    Readmission Risk Interventions No flowsheet data found.

## 2021-06-28 DIAGNOSIS — E119 Type 2 diabetes mellitus without complications: Secondary | ICD-10-CM

## 2021-06-28 LAB — GLUCOSE, CAPILLARY
Glucose-Capillary: 173 mg/dL — ABNORMAL HIGH (ref 70–99)
Glucose-Capillary: 184 mg/dL — ABNORMAL HIGH (ref 70–99)

## 2021-06-28 LAB — HEMOGLOBIN A1C
Hgb A1c MFr Bld: 7 % — ABNORMAL HIGH (ref 4.8–5.6)
Mean Plasma Glucose: 154 mg/dL

## 2021-06-28 MED ORDER — LOPERAMIDE HCL 2 MG PO CAPS
2.0000 mg | ORAL_CAPSULE | ORAL | Status: DC | PRN
  Administered 2021-06-28: 2 mg via ORAL
  Filled 2021-06-28: qty 1

## 2021-06-28 MED ORDER — LOPERAMIDE HCL 2 MG PO CAPS: 2.0000 mg | ORAL_CAPSULE | ORAL | 0 refills | Status: AC | PRN

## 2021-06-28 NOTE — Progress Notes (Signed)
Occupational Therapy Treatment Patient Details Name: Craig Wilson MRN: [id number] DOB: 1940/10/21 Today's Date: 06/28/2021   History of present illness Patient is an 81 year old male who presents to [facility] for frequent falls. Patient has a PMH (+) for for bipolar disorder, prostate cancer status post radical prostatectomy, hypertension, diabetes mellitus. Most frequent fall came when walking boot got caught when patient was trying to stand.  Radiographs in the emergency department showed a minimally displaced left lateral malleolus fracture.  A splint was placed on the left lower extremity.   OT comments  Craig Wilson was seen for OT treatment on this date. Upon arrival to room pt reclined in bed, agreeable to tx. Pt requires MIN A bed mobility. MIN A + RW for sit<>stand x 2 trials, pt unable to sequence side steps with only +1 assist. SETUP + SUPERVISION grooming seated EOB. Pt making good progress toward goals. Pt continues to benefit from skilled OT services to maximize return to PLOF and minimize risk of future falls, injury, caregiver burden, and readmission. Will continue to follow POC. Discharge recommendation remains appropriate.     Recommendations for follow up therapy are one component of a multi-disciplinary discharge planning process, led by the attending physician.  Recommendations may be updated based on patient status, additional functional criteria and insurance authorization.    Follow Up Recommendations  Skilled nursing-short term rehab (<3 hours/day)    Assistance Recommended at Discharge Frequent or constant Supervision/Assistance  Patient can return home with the following  Two people to help with walking and/or transfers;Two people to help with bathing/dressing/bathroom;Assistance with cooking/housework;Assist for transportation;Help with stairs or ramp for entrance   Equipment Recommendations  Other (comment) (defer)    Recommendations for Other Services       Precautions / Restrictions Precautions Precautions: Fall Restrictions Weight Bearing Restrictions: Yes RLE Weight Bearing: Weight bearing as tolerated LLE Weight Bearing: Non weight bearing       Mobility Bed Mobility Overal bed mobility: Needs Assistance Bed Mobility: Supine to Sit, Sit to Supine     Supine to sit: Min assist Sit to supine: Min assist        Transfers Overall transfer level: Needs assistance Equipment used: Rolling walker (2 wheels) Transfers: Sit to/from Stand Sit to Stand: Min assist, From elevated surface                 Balance Overall balance assessment: Needs assistance Sitting-balance support: No upper extremity supported, Feet supported Sitting balance-Leahy Scale: Good     Standing balance support: Bilateral upper extremity supported, Reliant on assistive device for balance Standing balance-Leahy Scale: Poor                             ADL either performed or assessed with clinical judgement   ADL Overall ADL's : Needs assistance/impaired                                       General ADL Comments: MIN A + RW for ADL t/f, pt unable to sequence side steps with only +1 assist. SETUP + SUPERVISION grooming seated EOB.      Cognition Arousal/Alertness: Awake/alert Behavior During Therapy: WFL for tasks assessed/performed Overall Cognitive Status: Within Functional Limits for tasks assessed  General Comments: cues to maintain pcns                   Pertinent Vitals/ Pain       Pain Assessment Pain Assessment: 0-10 Pain Score: 3  Pain Location: L foot Pain Descriptors / Indicators: Aching, Discomfort, Dull Pain Intervention(s): Limited activity within patient's tolerance, Repositioned   Frequency  Min 2X/week        Progress Toward Goals  OT Goals(current goals can now be found in the care plan section)  Progress towards OT goals:  Progressing toward goals  Acute Rehab OT Goals Patient Stated Goal: to go home OT Goal Formulation: With patient Time For Goal Achievement: 07/08/21 Potential to Achieve Goals: Good ADL Goals Pt Will Perform Lower Body Dressing: with min assist;bed level;with adaptive equipment Pt Will Transfer to Toilet: with mod assist;squat pivot transfer;bedside commode Pt Will Perform Toileting - Clothing Manipulation and hygiene: with min assist;sitting/lateral leans  Plan Discharge plan remains appropriate;Frequency remains appropriate    Co-evaluation                 AM-PAC OT "6 Clicks" Daily Activity     Outcome Measure   Help from another person eating meals?: None Help from another person taking care of personal grooming?: A Little Help from another person toileting, which includes using toliet, bedpan, or urinal?: A Lot Help from another person bathing (including washing, rinsing, drying)?: A Lot Help from another person to put on and taking off regular upper body clothing?: A Little Help from another person to put on and taking off regular lower body clothing?: A Lot 6 Click Score: 16    End of Session Equipment Utilized During Treatment: Gait belt;Rolling walker (2 wheels)  OT Visit Diagnosis: Other abnormalities of gait and mobility (R26.89);Repeated falls (R29.6)   Activity Tolerance Patient tolerated treatment well   Patient Left in bed;with call bell/phone within reach;with bed alarm set   Nurse Communication          Time: 9977-4142 OT Time Calculation (min): 24 min  Charges: OT General Charges $OT Visit: 1 Visit OT Treatments $Self Care/Home Management : 23-37 mins  Dessie Coma, M.S. OTR/L  06/28/21, 1:00 PM  ascom [phone]

## 2021-06-28 NOTE — TOC Progression Note (Signed)
Transition of Care [facility]) - Progression Note    Patient Details  Name: Craig Wilson MRN: [id number] Date of Birth: 1940-09-27  Transition of Care [facility]) CM/SW [place], RN Phone Number: 06/28/2021, 12:19 PM  Clinical Narrative:    Called EMS to transport the patient I attempted to reach the  patient's wife and was unable to , no VM to leave a message, the patient is alert and oriented and will let her know, He is going to room 508 at [facility], He is 3rd on the list        Expected Discharge Plan and Services           Expected Discharge Date: 06/28/21                                     Social Determinants of Health (SDOH) Interventions    Readmission Risk Interventions No flowsheet data found.

## 2021-06-28 NOTE — TOC Progression Note (Signed)
Transition of Care [facility]) - Progression Note    Patient Details  Name: Craig Wilson MRN: [id number] Date of Birth: 01/19/1941  Transition of Care [facility]) CM/SW [place], RN Phone Number: 06/28/2021, 8:26 AM  Clinical Narrative:   [facility] the patient today, room 508         Expected Discharge Plan and Services           Expected Discharge Date: 06/27/21                                     Social Determinants of Health (SDOH) Interventions    Readmission Risk Interventions No flowsheet data found.

## 2021-06-28 NOTE — Discharge Summary (Signed)
Physician Discharge Summary  Patient ID: Craig Wilson MRN: [id number] DOB/AGE: Jun 10, 1940 81 y.o.  Admit date: 06/24/2021 Discharge date: 06/28/2021  Admission Diagnoses:  Discharge Diagnoses:  Principal Problem:   Frequent falls Active Problems:   Parkinson's disease ([place])   Bipolar disorder ([place])   Type 2 diabetes mellitus without complication, without long-term current use of insulin ([place])   Closed bilateral ankle fractures   Discharged Condition: good  Hospital Course:   Craig Wilson is a 81 y.o. male with medical history significant for prostate cancer status post radical prostatectomy, hypertension, diabetes mellitus who presents to the ER for evaluation after a fall.  Patient was seen in the emergency room 10 days ago after a fall and at that time was found to have a closed nondisplaced fracture of his right distal fibula as well as right second and third metatarsals and was discharged home on a walking boot to follow-up with orthopedic surgery as an outpatient. He returns to the ER after another fall at home with inability to ambulate and now has a minimally displaced fracture of the distal left fibula.   Acute left minimally displaced lateral malleolus fracture sustained on 06/24/21 Evaluated by orthopedics, immobilized left ankle, no weightbearing on left leg.  Continue pain medicine. No need for surgery. Patient has been accepted for rehab, he is medically stable to be discharged.   Right minimally displaced lateral malleolus fracture with nondisplaced second and third metatarsal base fractures sustained on 06/14/2021 Weightbearing as tolerated   Parkinson's disease Continue Sinemet   Diabetes mellitus type 2 with hyperglycemia Continue current treatment.   Bipolar disorder --cont home Lithium   Low-grade fever  Appear to be secondary to fracture.  No fever since 1/24.  COVID-negative.  Consults: orthopedic surgery  Significant Diagnostic Studies:    Treatments: symptomatic treatment  Discharge Exam: Blood pressure 121/64, pulse 89, temperature 98.8 F (37.1 C), temperature source Oral, resp. rate 16, height 5\' 7"  (1.702 m), weight 79 kg, SpO2 97 %. General appearance: alert and cooperative Resp: clear to auscultation bilaterally Cardio: regular rate and rhythm, S1, S2 normal, no murmur, click, rub or gallop GI: soft, non-tender; bowel sounds normal; no masses,  no organomegaly Extremities: extremities normal, atraumatic, no cyanosis or edema  Disposition: Discharge disposition: 03-Skilled Nursing Facility       Discharge Instructions     Diet - low sodium heart healthy   Complete by: As directed    Discharge wound care:   Complete by: As directed    Follow with RN   Increase activity slowly   Complete by: As directed    Increase activity slowly   Complete by: As directed    No weight bearing on left leg      Allergies as of 06/28/2021       Reactions   Carbidopa W-levodopa Other (See Comments)   irrational behavior , felt zombie like   Carbidopa-levodopa Other (See Comments)   irrational behavior , felt zombie like   Latex Other (See Comments)   Internal such as catheters. Latex gloves are okay   Solifenacin Other (See Comments)        Medication List     STOP taking these medications    docusate sodium 100 MG capsule Commonly known as: COLACE       TAKE these medications    aspirin EC 81 MG tablet Take 81 mg by mouth daily. Swallow whole.   atorvastatin 40 MG tablet Commonly known as: LIPITOR Take 1  tablet (40 mg total) by mouth every evening.   carbidopa-levodopa 25-100 MG tablet Commonly known as: SINEMET IR Take 1 tablet by mouth 4 (four) times daily.   HYDROcodone-acetaminophen 5-325 MG tablet Commonly known as: Norco Take 1 tablet by mouth every 6 (six) hours as needed for moderate pain.   lithium carbonate 300 MG capsule Take 300 mg by mouth 2 (two) times daily.   loperamide  2 MG capsule Commonly known as: IMODIUM Take 1 capsule (2 mg total) by mouth as needed for diarrhea or loose stools.   polyethylene glycol 17 g packet Commonly known as: MIRALAX / GLYCOLAX Take 17 g by mouth daily as needed for moderate constipation.   rOPINIRole 2 MG tablet Commonly known as: REQUIP Take 2 mg by mouth at bedtime.   traZODone 50 MG tablet Commonly known as: DESYREL Take 50-100 mg by mouth at bedtime.               Discharge Care Instructions  (From admission, onward)           Start     Ordered   06/28/21 0000  Discharge wound care:       Comments: Follow with RN   06/28/21 6122            Contact information for follow-up providers     Tama High III, MD Follow up in 1 week(s).   Specialty: Internal Medicine Contact information: [place] [address] [phone]         Leim Fabry, MD Follow up in 1 week(s).   Specialty: Orthopedic Surgery Contact information: [place] [address] [phone]              Contact information for after-discharge care     [facility] .   Service: Skilled Nursing Contact information: [place] [phone]                     Signed: Sharen Hones 06/28/2021, 9:26 AM

## 2021-08-06 ENCOUNTER — Ambulatory Visit
Admission: EM | Admit: 2021-08-06 | Discharge: 2021-08-06 | Disposition: A | Discharge status: Home / Self Care | Payer: Medicare Other

## 2021-12-08 ENCOUNTER — Emergency Department: Payer: Medicare Other

## 2021-12-08 ENCOUNTER — Encounter: Payer: Self-pay | Admitting: Emergency Medicine

## 2021-12-08 ENCOUNTER — Other Ambulatory Visit: Payer: Self-pay

## 2021-12-08 ENCOUNTER — Inpatient Hospital Stay
Admission: EM | Admit: 2021-12-08 | Discharge: 2021-12-11 | DRG: 864 | Disposition: A | Discharge status: Home / Self Care | Payer: Medicare Other | Attending: Internal Medicine | Admitting: Internal Medicine

## 2021-12-08 DIAGNOSIS — I10 Essential (primary) hypertension: Secondary | ICD-10-CM | POA: Diagnosis present

## 2021-12-08 DIAGNOSIS — Z8249 Family history of ischemic heart disease and other diseases of the circulatory system: Secondary | ICD-10-CM

## 2021-12-08 DIAGNOSIS — R509 Fever, unspecified: Principal | ICD-10-CM | POA: Diagnosis present

## 2021-12-08 DIAGNOSIS — E785 Hyperlipidemia, unspecified: Secondary | ICD-10-CM | POA: Diagnosis present

## 2021-12-08 DIAGNOSIS — Z7982 Long term (current) use of aspirin: Secondary | ICD-10-CM | POA: Diagnosis not present

## 2021-12-08 DIAGNOSIS — Z83438 Family history of other disorder of lipoprotein metabolism and other lipidemia: Secondary | ICD-10-CM

## 2021-12-08 DIAGNOSIS — F319 Bipolar disorder, unspecified: Secondary | ICD-10-CM | POA: Diagnosis present

## 2021-12-08 DIAGNOSIS — R6510 Systemic inflammatory response syndrome (SIRS) of non-infectious origin without acute organ dysfunction: Secondary | ICD-10-CM | POA: Diagnosis present

## 2021-12-08 DIAGNOSIS — Z79899 Other long term (current) drug therapy: Secondary | ICD-10-CM | POA: Diagnosis not present

## 2021-12-08 DIAGNOSIS — G20 Parkinson's disease: Secondary | ICD-10-CM | POA: Diagnosis present

## 2021-12-08 DIAGNOSIS — R2681 Unsteadiness on feet: Secondary | ICD-10-CM | POA: Diagnosis present

## 2021-12-08 DIAGNOSIS — Z87891 Personal history of nicotine dependence: Secondary | ICD-10-CM | POA: Diagnosis not present

## 2021-12-08 DIAGNOSIS — G4700 Insomnia, unspecified: Secondary | ICD-10-CM | POA: Diagnosis present

## 2021-12-08 DIAGNOSIS — G4730 Sleep apnea, unspecified: Secondary | ICD-10-CM | POA: Diagnosis present

## 2021-12-08 DIAGNOSIS — Z8546 Personal history of malignant neoplasm of prostate: Secondary | ICD-10-CM

## 2021-12-08 DIAGNOSIS — M25552 Pain in left hip: Secondary | ICD-10-CM | POA: Diagnosis present

## 2021-12-08 DIAGNOSIS — M109 Gout, unspecified: Secondary | ICD-10-CM | POA: Diagnosis present

## 2021-12-08 DIAGNOSIS — A419 Sepsis, unspecified organism: Principal | ICD-10-CM

## 2021-12-08 DIAGNOSIS — R296 Repeated falls: Secondary | ICD-10-CM | POA: Diagnosis present

## 2021-12-08 DIAGNOSIS — D696 Thrombocytopenia, unspecified: Secondary | ICD-10-CM | POA: Diagnosis present

## 2021-12-08 DIAGNOSIS — M1990 Unspecified osteoarthritis, unspecified site: Secondary | ICD-10-CM | POA: Diagnosis present

## 2021-12-08 DIAGNOSIS — K529 Noninfective gastroenteritis and colitis, unspecified: Secondary | ICD-10-CM | POA: Diagnosis present

## 2021-12-08 DIAGNOSIS — E1140 Type 2 diabetes mellitus with diabetic neuropathy, unspecified: Secondary | ICD-10-CM | POA: Diagnosis present

## 2021-12-08 DIAGNOSIS — G9341 Metabolic encephalopathy: Secondary | ICD-10-CM | POA: Diagnosis present

## 2021-12-08 DIAGNOSIS — Z20822 Contact with and (suspected) exposure to covid-19: Secondary | ICD-10-CM | POA: Diagnosis present

## 2021-12-08 DIAGNOSIS — R32 Unspecified urinary incontinence: Secondary | ICD-10-CM | POA: Diagnosis present

## 2021-12-08 LAB — COMPREHENSIVE METABOLIC PANEL
ALT: 17 U/L (ref 0–44)
AST: 13 U/L — ABNORMAL LOW (ref 15–41)
Albumin: 4.4 g/dL (ref 3.5–5.0)
Alkaline Phosphatase: 81 U/L (ref 38–126)
Anion gap: 7 (ref 5–15)
BUN: 21 mg/dL (ref 8–23)
CO2: 24 mmol/L (ref 22–32)
Calcium: 9.9 mg/dL (ref 8.9–10.3)
Chloride: 107 mmol/L (ref 98–111)
Creatinine, Ser: 0.97 mg/dL (ref 0.61–1.24)
GFR, Estimated: >60 mL/min (ref >60)
Glucose, Bld: 131 mg/dL — ABNORMAL HIGH (ref 70–99)
Potassium: 4.6 mmol/L (ref 3.5–5.1)
Sodium: 138 mmol/L (ref 135–145)
Total Bilirubin: 0.8 mg/dL (ref 0.3–1.2)
Total Protein: 7 g/dL (ref 6.5–8.1)

## 2021-12-08 LAB — CBC WITH DIFFERENTIAL/PLATELET
Abs Immature Granulocytes: 0.02 10*3/uL (ref 0.00–0.07)
Basophils Absolute: 0 10*3/uL (ref 0.0–0.1)
Basophils Relative: 0 %
Eosinophils Absolute: 0 10*3/uL (ref 0.0–0.5)
Eosinophils Relative: 0 %
HCT: 36 % — ABNORMAL LOW (ref 39.0–52.0)
Hemoglobin: 11.6 g/dL — ABNORMAL LOW (ref 13.0–17.0)
Immature Granulocytes: 0 %
Lymphocytes Relative: 4 %
Lymphs Abs: 0.3 10*3/uL — ABNORMAL LOW (ref 0.7–4.0)
MCH: 32.3 pg (ref 26.0–34.0)
MCHC: 32.2 g/dL (ref 30.0–36.0)
MCV: 100.3 fL — ABNORMAL HIGH (ref 80.0–100.0)
Monocytes Absolute: 0.3 10*3/uL (ref 0.1–1.0)
Monocytes Relative: 4 %
Neutro Abs: 7.2 10*3/uL (ref 1.7–7.7)
Neutrophils Relative %: 92 %
Platelets: 107 10*3/uL — ABNORMAL LOW (ref 150–400)
RBC: 3.59 MIL/uL — ABNORMAL LOW (ref 4.22–5.81)
RDW: 12.3 % (ref 11.5–15.5)
WBC: 7.8 10*3/uL (ref 4.0–10.5)
nRBC: 0 % (ref 0.0–0.2)

## 2021-12-08 LAB — URINALYSIS, ROUTINE W REFLEX MICROSCOPIC
Bacteria, UA: NONE SEEN
Bilirubin Urine: NEGATIVE
Glucose, UA: NEGATIVE mg/dL
Ketones, ur: NEGATIVE mg/dL
Leukocytes,Ua: NEGATIVE
Nitrite: NEGATIVE
Protein, ur: NEGATIVE mg/dL
Specific Gravity, Urine: 1.009 (ref 1.005–1.030)
Squamous Epithelial / HPF: NONE SEEN (ref 0–5)
pH: 7 (ref 5.0–8.0)

## 2021-12-08 LAB — LACTIC ACID, PLASMA: Lactic Acid, Venous: 1.6 mmol/L (ref 0.5–1.9)

## 2021-12-08 LAB — URINE DRUG SCREEN, QUALITATIVE (ARMC ONLY)
Amphetamines, Ur Screen: NOT DETECTED
Barbiturates, Ur Screen: NOT DETECTED
Benzodiazepine, Ur Scrn: NOT DETECTED
Cannabinoid 50 Ng, Ur ~~LOC~~: NOT DETECTED
Cocaine Metabolite,Ur ~~LOC~~: NOT DETECTED
MDMA (Ecstasy)Ur Screen: NOT DETECTED
Methadone Scn, Ur: NOT DETECTED
Opiate, Ur Screen: NOT DETECTED
Phencyclidine (PCP) Ur S: NOT DETECTED
Tricyclic, Ur Screen: NOT DETECTED

## 2021-12-08 LAB — PROTIME-INR
INR: 1.1 (ref 0.8–1.2)
Prothrombin Time: 14.1 seconds (ref 11.4–15.2)

## 2021-12-08 LAB — PROCALCITONIN: Procalcitonin: 0.29 ng/mL

## 2021-12-08 MED ORDER — LITHIUM CARBONATE 300 MG PO CAPS
300.0000 mg | ORAL_CAPSULE | Freq: Two times a day (BID) | ORAL | Status: DC
  Administered 2021-12-09 – 2021-12-11 (×4): 300 mg via ORAL
  Filled 2021-12-08 (×5): qty 1

## 2021-12-08 MED ORDER — VANCOMYCIN HCL 1500 MG/300ML IV SOLN
1500.0000 mg | INTRAVENOUS | Status: DC
  Administered 2021-12-10: 1500 mg via INTRAVENOUS
  Filled 2021-12-08: qty 300

## 2021-12-08 MED ORDER — ACETAMINOPHEN 500 MG PO TABS
1000.0000 mg | ORAL_TABLET | Freq: Once | ORAL | Status: AC
  Administered 2021-12-08: 1000 mg via ORAL
  Filled 2021-12-08: qty 2

## 2021-12-08 MED ORDER — ACETAMINOPHEN 650 MG RE SUPP: 650.0000 mg | Freq: Four times a day (QID) | RECTAL | Status: DC | PRN

## 2021-12-08 MED ORDER — ATORVASTATIN CALCIUM 20 MG PO TABS
40.0000 mg | ORAL_TABLET | Freq: Every evening | ORAL | Status: DC
  Administered 2021-12-09 – 2021-12-10 (×2): 40 mg via ORAL
  Filled 2021-12-08 (×2): qty 2

## 2021-12-08 MED ORDER — SODIUM CHLORIDE 0.9 % IV BOLUS
1000.0000 mL | Freq: Once | INTRAVENOUS | Status: AC
  Administered 2021-12-08: 1000 mL via INTRAVENOUS

## 2021-12-08 MED ORDER — ACETAMINOPHEN 325 MG PO TABS
650.0000 mg | ORAL_TABLET | Freq: Four times a day (QID) | ORAL | Status: DC | PRN
  Administered 2021-12-09: 650 mg via ORAL
  Filled 2021-12-08: qty 2

## 2021-12-08 MED ORDER — SODIUM CHLORIDE 0.9 % IV SOLN
2.0000 g | Freq: Once | INTRAVENOUS | Status: AC
  Administered 2021-12-08: 2 g via INTRAVENOUS
  Filled 2021-12-08: qty 12.5

## 2021-12-08 MED ORDER — LOPERAMIDE HCL 2 MG PO CAPS
2.0000 mg | ORAL_CAPSULE | ORAL | Status: DC | PRN
Start: 2021-12-08 — End: 2021-12-11

## 2021-12-08 MED ORDER — LACTATED RINGERS IV SOLN
INTRAVENOUS | Status: AC
Start: 2021-12-08 — End: 2021-12-09

## 2021-12-08 MED ORDER — ENOXAPARIN SODIUM 40 MG/0.4ML IJ SOSY
40.0000 mg | PREFILLED_SYRINGE | INTRAMUSCULAR | Status: DC
  Administered 2021-12-09 – 2021-12-10 (×3): 40 mg via SUBCUTANEOUS
  Filled 2021-12-08 (×3): qty 0.4

## 2021-12-08 MED ORDER — ONDANSETRON HCL 4 MG/2ML IJ SOLN: 4.0000 mg | Freq: Four times a day (QID) | INTRAMUSCULAR | Status: DC | PRN

## 2021-12-08 MED ORDER — ONDANSETRON HCL 4 MG PO TABS: 4.0000 mg | ORAL_TABLET | Freq: Four times a day (QID) | ORAL | Status: DC | PRN

## 2021-12-08 MED ORDER — METRONIDAZOLE 500 MG/100ML IV SOLN
500.0000 mg | Freq: Once | INTRAVENOUS | Status: AC
  Administered 2021-12-09: 500 mg via INTRAVENOUS
  Filled 2021-12-08: qty 100

## 2021-12-08 MED ORDER — TRAZODONE HCL 50 MG PO TABS
50.0000 mg | ORAL_TABLET | Freq: Every day | ORAL | Status: DC
Start: 2021-12-08 — End: 2021-12-11
  Administered 2021-12-09 – 2021-12-10 (×2): 50 mg via ORAL
  Filled 2021-12-08 (×2): qty 1

## 2021-12-08 MED ORDER — SODIUM CHLORIDE 0.9 % IV SOLN
2.0000 g | Freq: Three times a day (TID) | INTRAVENOUS | Status: DC
  Administered 2021-12-09 – 2021-12-11 (×7): 2 g via INTRAVENOUS
  Filled 2021-12-08: qty 2
  Filled 2021-12-08 (×2): qty 12.5
  Filled 2021-12-08: qty 2
  Filled 2021-12-08: qty 12.5
  Filled 2021-12-08 (×3): qty 2

## 2021-12-08 MED ORDER — VANCOMYCIN HCL 1750 MG/350ML IV SOLN
1750.0000 mg | Freq: Once | INTRAVENOUS | Status: AC
  Administered 2021-12-09: 1750 mg via INTRAVENOUS
  Filled 2021-12-08: qty 350

## 2021-12-08 MED ORDER — ASPIRIN 81 MG PO TBEC
81.0000 mg | DELAYED_RELEASE_TABLET | Freq: Every day | ORAL | Status: DC
  Administered 2021-12-09 – 2021-12-11 (×3): 81 mg via ORAL
  Filled 2021-12-08 (×3): qty 1

## 2021-12-08 MED ORDER — CARBIDOPA-LEVODOPA 25-100 MG PO TABS
1.0000 | ORAL_TABLET | Freq: Four times a day (QID) | ORAL | Status: DC
  Administered 2021-12-09 – 2021-12-11 (×9): 1 via ORAL
  Filled 2021-12-08 (×11): qty 1

## 2021-12-08 MED ORDER — VANCOMYCIN HCL IN DEXTROSE 1-5 GM/200ML-% IV SOLN: 1000.0000 mg | Freq: Once | INTRAVENOUS | Status: DC

## 2021-12-08 MED ORDER — HYDROCODONE-ACETAMINOPHEN 5-325 MG PO TABS: 1.0000 | ORAL_TABLET | ORAL | Status: DC | PRN

## 2021-12-08 MED ORDER — MORPHINE SULFATE (PF) 2 MG/ML IV SOLN: 2.0000 mg | INTRAVENOUS | Status: DC | PRN

## 2021-12-08 NOTE — Progress Notes (Signed)
CODE SEPSIS - PHARMACY COMMUNICATION  **Broad Spectrum Antibiotics should be administered within 1 hour of Sepsis diagnosis**  Time Code Sepsis Called/Page Received:  7/8 @ 2137  Antibiotics Ordered: Cefepime 2 gm IV X 1 and Vancomycin 1750 mg IV X 1.   Time of 1st antibiotic administration: Cefepime 2 gm IV X 1 on 7/8 @ 2210.   Additional action taken by pharmacy:   If necessary, Name of Provider/Nurse Contacted:     Kameisha Malicki D ,PharmD Clinical Pharmacist  12/08/2021  10:53 PM

## 2021-12-08 NOTE — Sepsis Progress Note (Signed)
Elink following code sepsis °

## 2021-12-08 NOTE — ED Triage Notes (Signed)
First RN Note: Pt to ED via POV with c/o increasing weakness and confusion, and decreased mobility. Pt's wife reports has been ongoing since his last fall, unable to state when that was. Pt noted to have urinated on himself at this time. Pt able to state his name and birthday at this time. Pt's wife reports hx of parkinson's, depression, weakness, and falls.

## 2021-12-08 NOTE — H&P (Signed)
H&P:    Craig Wilson   ZOX:[id number] DOB: July 04, 1940 DOA: 12/08/2021  PCP: Adin Hector, MD  Chief Complaint: Altered mental status   History of Present Illness:    HPI: Craig Wilson is a 81 y.o. male with a past medical history of bipolar disorder, Parkinson's disease, hypertension, hyperlipidemia.  This patient presents to the emergency department with altered mental status.  Wife reported that over the last several weeks he is becoming more disoriented however was worse today.  Not able to do much for himself.  She is not aware of any fevers.  Upon my evaluation he is alert and oriented x2.  Does not appear to be altered.  Was febrile at 103F in the ED.  Unclear etiology.  Denies any abdominal pain. His only complaint is left hip pain. Has an artificial urethral sphincter.  ED Course: Broad workup which is thus far unremarkable including labs, CXR, CT head. Given broad spectrum antibiotics with vancomycin, cefepime, flagyl. COVID-19 and RVP pending. Blood cultures pending results.    ROS:   13 point review of systems is negative except for what is mentioned above in the HPI.   Past Medical History:   Past Medical History:  Diagnosis Date   Anemia    Bipolar disorder (HCC)    Chronic diarrhea    Colon polyps    Depression    Dermatophytosis    Diabetic neuropathy (HCC)    DJD (degenerative joint disease)    Dyspnea    Gout    Gross hematuria    HLD (hyperlipidemia)    HTN (hypertension)    Hyperkeratosis    Onychomycosis    Parkinson disease (HCC)    Prostate cancer (HCC)    Sleep apnea    Tremor    Type 2 diabetes mellitus (HCC)     Past Surgical History:   Past Surgical History:  Procedure Laterality Date   cataract surgery Bilateral    COLONOSCOPY W/ BIOPSIES     FOOT SURGERY     PROSTATE SURGERY     removed   status post implantation of artificial urinary sphincter      TONSILLECTOMY      Social History:   Social History    Socioeconomic History   Marital status: Married    Spouse name: Not on file   Number of children: Not on file   Years of education: Not on file   Highest education level: Not on file  Occupational History   Not on file  Tobacco Use   Smoking status: Former    Types: Cigarettes    Quit date: 10/24/1999    Years since quitting: 22.1   Smokeless tobacco: Never  Substance and Sexual Activity   Alcohol use: No    Alcohol/week: 0.0 standard drinks of alcohol   Drug use: No   Sexual activity: Not on file  Other Topics Concern   Not on file  Social History Narrative   Caffeine use 3 servings daily.   Social Determinants of Health   Financial Resource Strain: Not on file  Food Insecurity: Not on file  Transportation Needs: Not on file  Physical Activity: Not on file  Stress: Not on file  Social Connections: Not on file  Intimate Partner Violence: Not on file    Allergies:   Allergies  Allergen Reactions   Carbidopa W-Levodopa Other (See Comments)    irrational behavior , felt zombie like   Carbidopa-Levodopa Other (See Comments)  irrational behavior , felt zombie like   Latex Other (See Comments)    Internal such as catheters. Latex gloves are okay   Solifenacin Other (See Comments)    Family History:   Family History  Problem Relation Age of Onset   Heart disease Mother    Heart attack Father 40   Hypertension Father    Hyperlipidemia Father    Kidney cancer Neg Hx    Prostate cancer Neg Hx    Bladder Cancer Neg Hx      Current Medications:   Prior to Admission medications   Medication Sig Start Date End Date Taking? Authorizing Provider  aspirin EC 81 MG tablet Take 81 mg by mouth daily. Swallow whole.    [provider]  atorvastatin (LIPITOR) 40 MG tablet Take 1 tablet (40 mg total) by mouth every evening. 12/19/20   Minna Merritts, MD  carbidopa-levodopa (SINEMET IR) 25-100 MG tablet Take 1 tablet by mouth 4 (four) times daily.     [provider]  HYDROcodone-acetaminophen (NORCO) 5-325 MG tablet Take 1 tablet by mouth every 6 (six) hours as needed for moderate pain. 06/27/21   Sharen Hones, MD  lithium carbonate 300 MG capsule Take 300 mg by mouth 2 (two) times daily.    [provider]  loperamide (IMODIUM) 2 MG capsule Take 1 capsule (2 mg total) by mouth as needed for diarrhea or loose stools. 06/28/21   Sharen Hones, MD  polyethylene glycol (MIRALAX / GLYCOLAX) 17 g packet Take 17 g by mouth daily as needed for moderate constipation. Patient not taking: Reported on 06/24/2021 11/09/20   Edwin Dada, MD  rOPINIRole (REQUIP) 2 MG tablet Take 2 mg by mouth at bedtime.    [provider]  traZODone (DESYREL) 50 MG tablet Take 50-100 mg by mouth at bedtime.    [provider]     Physical Exam:   Vitals:   12/08/21 2001 12/08/21 2002 12/08/21 2209  BP: (!) 159/43  (!) 135/41  Pulse: (!) 106  72  Resp: (!) 26  18  Temp: (!) 103 F (39.4 C)  99.4 F (37.4 C)  TempSrc: Oral  Oral  SpO2: 93%  100%  Weight:  79 kg   Height:  '5\' 11"'$  (1.803 m)      General:  Appears calm and comfortable and is in NAD Cardiovascular:  RRR, no m/r/g.  Respiratory:   CTA bilaterally with no wheezes/rales/rhonchi.  Normal respiratory effort. Abdomen:  soft, NT, ND, NABS Skin:  no rash or induration seen on limited exam Musculoskeletal:  grossly normal tone BUE/BLE, good ROM, no bony abnormality Lower extremity:  No LE edema.  Limited foot exam with no ulcerations.  2+ distal pulses. Psychiatric:  grossly normal mood and affect, speech fluent and appropriate, AOx2 to name and place. Neurologic:  CN 2-12 grossly intact, moves all extremities in coordinated fashion, sensation intact    Data Review:    Radiological Exams on Admission: Independently reviewed - see discussion in A/P where applicable  CT HEAD WO CONTRAST (5MM)  Result Date: 12/08/2021 CLINICAL DATA:  Mental status change,  unknown cause EXAM: CT HEAD WITHOUT CONTRAST TECHNIQUE: Contiguous axial images were obtained from the base of the skull through the vertex without intravenous contrast. RADIATION DOSE REDUCTION: This exam was performed according to the departmental dose-optimization program which includes automated exposure control, adjustment of the mA and/or kV according to patient size and/or use of iterative reconstruction technique. COMPARISON:  05/06/2021 FINDINGS:  Brain: No acute intracranial abnormality. Specifically, no hemorrhage, hydrocephalus, mass lesion, acute infarction, or significant intracranial injury. Vascular: No hyperdense vessel or unexpected calcification. Skull: No acute calvarial abnormality. Sinuses/Orbits: No acute findings Other: None IMPRESSION: No acute intracranial abnormality. Electronically Signed   By: Rolm Baptise M.D.   On: 12/08/2021 22:04   DG Chest Port 1 View  Result Date: 12/08/2021 CLINICAL DATA:  Suspected sepsis, weakness, fever, shortness of breath EXAM: PORTABLE CHEST 1 VIEW COMPARISON:  05/06/2021 FINDINGS: The heart size and mediastinal contours are within normal limits. Both lungs are clear. The visualized skeletal structures are unremarkable. IMPRESSION: No active disease. Electronically Signed   By: Rolm Baptise M.D.   On: 12/08/2021 20:35    EKG: Independently reviewed.  NSR with rate 91 bpm   Labs on Admission: I have personally reviewed the available labs and imaging studies at the time of the admission.  Pertinent labs on Admission: lactic acid negative, procal unremarkable. UA negative.     Assessment/Plan:   SIRS: unknown etiology. Fever of 103F in the ED. No evidence of altered mental status. No nuchal rigidity. No leukocytosis.  No evidence of any skin breakdown or wounds.  Chest x-ray negative.  Urinalysis unremarkable.  Blood cultures and urine cultures pending.  Continue broad-spectrum antibiotics with vancomycin and cefepime for now.  Consult ID.  RVP  and COVID-19 pending.  Acute metabolic encephalopathy: No evidence of encephalopathy upon my evaluation.  Wife reported increased interest orientation at home.  Obtain lithium level, TSH, UDS, ammonia level and VBG.  Parkinson's disease: Continue home Sinemet  Insomnia: Continue home trazodone  Chronic diarrhea: Continue home Imodium  Bipolar disorder: Continue home lithium  Hyperlipidemia: Continue home Lipitor  Other information:    Level of Care: med tele DVT prophylaxis: lovenox subQ Code Status: Full code Consults: ID Admission status:  inpatient   Leslee Home DO [facility]   How to contact the [facility] Attending or Consulting provider [place] or covering provider during after hours [facility], for this patient?  Check the care team in [facility] and look for a) attending/consulting [facility] provider listed and b) the [facility] team listed Log into [email or web address] and use [place]'s universal password to access. If you do not have the password, please contact the hospital operator. Locate the [facility] provider you are looking for under [facility] and page to a number that you can be directly reached. If you still have difficulty reaching the provider, please page the [facility] (Director on Call) for the Hospitalists listed on amion for assistance.   12/08/2021, 10:56 PM

## 2021-12-08 NOTE — ED Triage Notes (Signed)
Pt presents to ER from home with spouse who reports pt has dementia and Parkinson's disease. Per spouse she noticed pt being more disoriented more than usual. Pt unable to answer questions.

## 2021-12-08 NOTE — ED Provider Notes (Signed)
[facility] Provider Note    Event Date/Time   First MD Initiated Contact with Patient 12/08/21 2001     (approximate)   History   Fever   HPI  Craig Wilson is a 81 y.o. male past medical history of Parkinson's disease bipolar disorder depression hyperlipidemia hypertension type 2 diabetes who presents with disorientation.  He is accompanied by his wife who notes that over the last several weeks he has becoming more disoriented however it was worse today and he was not able to do much for himself.  She was not aware of any fevers.  He has not had any urinary symptoms abdominal pain or fevers at home no chest pain has had shortness of breath for some time which is not new.  He has an artificial urethral sphincter in place denies any dysuria but is having some incontinence which is new for him.  Denies any neck pain or headache.    Past Medical History:  Diagnosis Date   Anemia    Bipolar disorder ([place])    Chronic diarrhea    Colon polyps    Depression    Dermatophytosis    Diabetic neuropathy (HCC)    DJD (degenerative joint disease)    Dyspnea    Gout    Gross hematuria    HLD (hyperlipidemia)    HTN (hypertension)    Hyperkeratosis    Onychomycosis    Parkinson disease ([place])    Prostate cancer ([place])    Sleep apnea    Tremor    Type 2 diabetes mellitus ([place])     Patient Active Problem List   Diagnosis Date Noted   Frequent falls 06/24/2021   Closed bilateral ankle fractures 06/24/2021   Acute respiratory failure with hypoxia and hypercapnia (HCC)    Altered mental status    Sepsis due to pneumonia ([place]) 11/05/2020   Acute kidney injury ([place]) 10/31/2020   Bladder outlet obstruction 10/31/2020   Hyponatremia 10/31/2020   Acute cystitis without hematuria 10/31/2020   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 04/27/2020   OSA (obstructive sleep apnea) 11/03/2019   RBBB (right bundle branch block) 11/03/2019   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   Other emphysema ([place]) 06/27/2017   Acquired trigger finger 04/10/2017   Elevated coronary artery calcium score 03/04/2017   Sepsis ([place]) 07/01/2016   CAP (community acquired pneumonia) 07/01/2016   Influenza A 07/01/2016   DDD (degenerative disc disease), lumbar 03/11/2016   Senile purpura ([place]) 03/11/2016   Spiradenoma 12/01/2015   Mass of left side of neck 10/19/2015   Thrombocytopenia ([place]) 09/07/2015   Bilateral edema of lower extremity 05/10/2015   Bipolar disorder, in partial remission, most recent episode depressed ([place]) 05/10/2015   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[place]) 05/10/2015   Edema of lower extremity 05/10/2015   RLS (restless legs syndrome) 03/29/2015   Adenomatous polyp 02/24/2015   Anemia 02/24/2015   Bipolar disorder ([place]) 02/24/2015   Chronic diarrhea 02/24/2015   Diabetic neuropathy ([place]) 02/24/2015   Arthritis, degenerative 02/24/2015   Arthritis urica 02/24/2015   BP (high blood pressure) 02/24/2015   CA of prostate ([place]) 02/24/2015   Incontinence 02/01/2015   History of prostate cancer 02/01/2015   Gross hematuria 02/01/2015   SOB (shortness of breath) 12/16/2014   Pain in the chest 12/16/2014   Family history of premature CAD 12/16/2014   Parkinson's disease ([place]) 12/16/2014   Essential hypertension 12/16/2014   Unsteady gait 12/16/2014   Hyperlipidemia 12/16/2014  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[place]) 11/23/2014   Difficulty in walking 10/10/2014   Difficulty sleeping 10/10/2014   Breath shortness 05/06/2014   Amnesia 02/28/2014   Type 2 diabetes mellitus without complication, without long-term current use of insulin ([place]) 02/15/2014   Has a tremor 02/01/2006   Tremor 02/01/2006     Physical Exam  Triage Vital Signs: ED Triage Vitals  Enc Vitals Group     BP 12/08/21 2001 (!) 159/43     Pulse Rate 12/08/21 2001 (!) 106     Resp 12/08/21 2001 (!) 26     Temp 12/08/21 2001 (!) 103 F (39.4 C)     Temp  Source 12/08/21 2001 Oral     SpO2 12/08/21 2001 93 %     Weight 12/08/21 2002 174 lb 2.6 oz (79 kg)     Height 12/08/21 2002 '5\' 11"'$  (1.803 m)     Head Circumference --      Peak Flow --      Pain Score --      Pain Loc --      Pain Edu? --      Excl. in [place]? --     Most recent vital signs: Vitals:   12/08/21 2001  BP: (!) 159/43  Pulse: (!) 106  Resp: (!) 26  Temp: (!) 103 F (39.4 C)  SpO2: 93%     General: Awake, no distress.  CV:  Good peripheral perfusion.  Resp:  Normal effort. No increased wob Abd:  No distention. soft Neuro:             Awake, alert, oriented x 1 Other:  Warm skin, no skin or soft tissue infection No meningusmus  Dry MM   ED Results / Procedures / Treatments  Labs (all labs ordered are listed, but only abnormal results are displayed) Labs Reviewed  COMPREHENSIVE METABOLIC PANEL - Abnormal; Notable for the following components:      Result Value   Glucose, Bld 131 (*)    AST 13 (*)    All other components within normal limits  CBC WITH DIFFERENTIAL/PLATELET - Abnormal; Notable for the following components:   RBC 3.59 (*)    Hemoglobin 11.6 (*)    HCT 36.0 (*)    MCV 100.3 (*)    Platelets 107 (*)    Lymphs Abs 0.3 (*)    All other components within normal limits  URINALYSIS, ROUTINE W REFLEX MICROSCOPIC - Abnormal; Notable for the following components:   Color, Urine STRAW (*)    APPearance CLEAR (*)    Hgb urine dipstick MODERATE (*)    All other components within normal limits  CULTURE, BLOOD (ROUTINE X 2)  CULTURE, BLOOD (ROUTINE X 2)  RESPIRATORY PANEL BY PCR  URINE CULTURE  LACTIC ACID, PLASMA  PROTIME-INR  LACTIC ACID, PLASMA  PROCALCITONIN     EKG  EKG interpreted myself shows sinus tachycardia with a right bundle branch block no acute ischemic changes   RADIOLOGY    PROCEDURES:  Critical Care performed: Yes, see critical care procedure note(s)  .1-3 Lead EKG Interpretation  Performed by: Rada Hay, MD Authorized by: Rada Hay, MD     Interpretation: abnormal     ECG rate assessment: tachycardic     Rhythm: sinus rhythm     Ectopy: none     Conduction: normal   .Critical Care  Performed by: Rada Hay, MD Authorized by: Rada Hay, MD   Critical care provider statement:  Critical care time (minutes):  30   Critical care was time spent personally by me on the following activities:  Development of treatment plan with patient or surrogate, discussions with consultants, evaluation of patient's response to treatment, examination of patient, ordering and review of laboratory studies, ordering and review of radiographic studies, ordering and performing treatments and interventions, pulse oximetry, re-evaluation of patient's condition and review of old charts   The patient is on the cardiac monitor to evaluate for evidence of arrhythmia and/or significant heart rate changes.   MEDICATIONS ORDERED IN ED: Medications  lactated ringers infusion (has no administration in time range)  ceFEPIme (MAXIPIME) 2 g in sodium chloride 0.9 % 100 mL IVPB (has no administration in time range)  metroNIDAZOLE (FLAGYL) IVPB 500 mg (has no administration in time range)  vancomycin (VANCOREADY) IVPB 1750 mg/350 mL (has no administration in time range)  sodium chloride 0.9 % bolus 1,000 mL (1,000 mLs Intravenous New Bag/Given 12/08/21 2055)  acetaminophen (TYLENOL) tablet 1,000 mg (1,000 mg Oral Given 12/08/21 2040)     IMPRESSION / MDM / ASSESSMENT AND PLAN / ED COURSE  I reviewed the triage vital signs and the nursing notes.                              Patient's presentation is most consistent with acute presentation with potential threat to life or bodily function.  Differential diagnosis includes, but is not limited to, sepsis secondary to UTI, pneumonia, less likely meningitis/encephalitis, bacteremia  Patient is a 81 year old male with underlying Parkinson's disease  who presents with disorientation primarily.  His wife notes that he has had a decline over the last several weeks but was worse today.  Other than cough which has been more chronic as well as chronic dyspnea he has no other acute symptoms did not have fevers at home denies abdominal pain urinary symptoms headache neck pain.  On exam he is flushed with warm skin he has no meningismus no increased work of breathing abdomen is benign no signs of skin or soft tissue infection he is febrile to 103 tachycardic tachypneic and hypertensive.  Plan to work-up broadly for sepsis will give fluids obtain cultures.  Of note patient has an artificial urethral sphincter and cannot have a Foley catheter or in and out catheter per his wife.  He has a bag in place will attempt to get urine this way.  Patient's labs are overall reassuring he has mild lymphopenia but no leukocytosis lactate is negative CMP is largely unremarkable UA with 0-5 white cells chest x-ray without acute findings.  Remains hemodynamically stable after liter of fluid given Tylenol on reassessment he has no complaints again no meningismus no neck pain headache.  Unclear what the source of infection is at this time we will send respiratory viral panel and Pro-Cal will cover with broad-spectrum antibiotics cefepime Vanco and Flagyl.  Did consider the possibility of encephalitis/meningitis given no other source however clinically patient really does not appear meningitic to me and is not encephalopathic I have low suspicion for this.  Will discuss with hospitalist for admission.      FINAL CLINICAL IMPRESSION(S) / ED DIAGNOSES   Final diagnoses:  Sepsis, due to unspecified organism, unspecified whether acute organ dysfunction present [facility])     Rx / DC Orders   ED Discharge Orders     None        Note:  This document was prepared  using Systems analyst and may include unintentional dictation errors.   Rada Hay,  MD 12/08/21 [phone]

## 2021-12-08 NOTE — Progress Notes (Signed)
PHARMACY -  BRIEF ANTIBIOTIC NOTE   Pharmacy has received consult(s) for Vancomycin, Cefepime from an ED provider.  The patient's profile has been reviewed for ht/wt/allergies/indication/available labs.    One time order(s) placed for Vancomycin 1750 mg IV X 1 and Cefepime 2 gm IV X 1.   Further antibiotics/pharmacy consults should be ordered by admitting physician if indicated.                       Thank you, Heberto Sturdevant D 12/08/2021  9:49 PM

## 2021-12-08 NOTE — ED Notes (Signed)
Per spouse pt is able to swallow medication

## 2021-12-08 NOTE — Progress Notes (Signed)
Pharmacy Antibiotic Note  Craig Wilson is a 81 y.o. male admitted on 12/08/2021 with sepsis.  Pharmacy has been consulted for Vancomycin, Cefepime dosing.  Plan: Cefepime 2 gm IV X 1 given in ED on 7/8 @ 2210. Cefepime 2 gm IV Q8H ordered to start on 7/9  @ 0600.  Vancomycin 1750 mg IV X 1 ordered for 7/9 @ ~ 0000. Vancomycin 1500 mg IV Q24H ordered to start on 7/10 @ ~ 0000.  AUC = 475.9 Vanc trough = 10.3   Height: '5\' 11"'$  (180.3 cm) Weight: 79 kg (174 lb 2.6 oz) IBW/kg (Calculated) : 75.3  Temp (24hrs), Avg:101.2 F (38.4 C), Min:99.4 F (37.4 C), Max:103 F (39.4 C)  Recent Labs  Lab 12/08/21 2036  WBC 7.8  CREATININE 0.97  LATICACIDVEN 1.6    Estimated Creatinine Clearance: 61.5 mL/min (by C-G formula based on SCr of 0.97 mg/dL).    Allergies  Allergen Reactions   Carbidopa W-Levodopa Other (See Comments)    irrational behavior , felt zombie like   Carbidopa-Levodopa Other (See Comments)    irrational behavior , felt zombie like   Latex Other (See Comments)    Internal such as catheters. Latex gloves are okay   Solifenacin Other (See Comments)    Antimicrobials this admission:   >>    >>   Dose adjustments this admission:   Microbiology results:  BCx:   UCx:    Sputum:    MRSA PCR:   Thank you for allowing pharmacy to be a part of this patient's care.  Craig Wilson 12/08/2021 10:56 PM

## 2021-12-09 ENCOUNTER — Inpatient Hospital Stay: Payer: Medicare Other

## 2021-12-09 ENCOUNTER — Encounter: Payer: Self-pay | Admitting: Family Medicine

## 2021-12-09 DIAGNOSIS — R509 Fever, unspecified: Secondary | ICD-10-CM

## 2021-12-09 DIAGNOSIS — G9341 Metabolic encephalopathy: Secondary | ICD-10-CM | POA: Diagnosis not present

## 2021-12-09 LAB — AMMONIA: Ammonia: 20 umol/L (ref 9–35)

## 2021-12-09 LAB — ACETAMINOPHEN LEVEL: Acetaminophen (Tylenol), Serum: <10 ug/mL — ABNORMAL LOW (ref 10–30)

## 2021-12-09 LAB — CBC
HCT: 35.7 % — ABNORMAL LOW (ref 39.0–52.0)
Hemoglobin: 11.5 g/dL — ABNORMAL LOW (ref 13.0–17.0)
MCH: 32.9 pg (ref 26.0–34.0)
MCHC: 32.2 g/dL (ref 30.0–36.0)
MCV: 102 fL — ABNORMAL HIGH (ref 80.0–100.0)
Platelets: 88 10*3/uL — ABNORMAL LOW (ref 150–400)
RBC: 3.5 MIL/uL — ABNORMAL LOW (ref 4.22–5.81)
RDW: 12.3 % (ref 11.5–15.5)
WBC: 4.9 10*3/uL (ref 4.0–10.5)
nRBC: 0 % (ref 0.0–0.2)

## 2021-12-09 LAB — LITHIUM LEVEL: Lithium Lvl: 0.84 mmol/L (ref 0.60–1.20)

## 2021-12-09 LAB — RESPIRATORY PANEL BY PCR

## 2021-12-09 LAB — BLOOD GAS, VENOUS
Acid-base deficit: 1.7 mmol/L (ref 0.0–2.0)
Bicarbonate: 25.2 mmol/L (ref 20.0–28.0)
O2 Saturation: 32.6 %
Patient temperature: 37
pCO2, Ven: 50 mmHg (ref 44–60)
pH, Ven: 7.31 (ref 7.25–7.43)
pO2, Ven: <31 mmHg — CL (ref 32–45)

## 2021-12-09 LAB — COMPREHENSIVE METABOLIC PANEL
ALT: 6 U/L (ref 0–44)
AST: 14 U/L — ABNORMAL LOW (ref 15–41)
Albumin: 4.2 g/dL (ref 3.5–5.0)
Alkaline Phosphatase: 83 U/L (ref 38–126)
Anion gap: 6 (ref 5–15)
BUN: 19 mg/dL (ref 8–23)
CO2: 23 mmol/L (ref 22–32)
Calcium: 9.6 mg/dL (ref 8.9–10.3)
Chloride: 111 mmol/L (ref 98–111)
Creatinine, Ser: 0.94 mg/dL (ref 0.61–1.24)
GFR, Estimated: >60 mL/min (ref >60)
Glucose, Bld: 164 mg/dL — ABNORMAL HIGH (ref 70–99)
Potassium: 4.3 mmol/L (ref 3.5–5.1)
Sodium: 140 mmol/L (ref 135–145)
Total Bilirubin: 0.7 mg/dL (ref 0.3–1.2)
Total Protein: 7 g/dL (ref 6.5–8.1)

## 2021-12-09 LAB — SALICYLATE LEVEL: Salicylate Lvl: <7 mg/dL — ABNORMAL LOW (ref 7.0–30.0)

## 2021-12-09 LAB — LACTIC ACID, PLASMA: Lactic Acid, Venous: 1.7 mmol/L (ref 0.5–1.9)

## 2021-12-09 LAB — TSH: TSH: 1.044 u[IU]/mL (ref 0.350–4.500)

## 2021-12-09 LAB — SARS CORONAVIRUS 2 BY RT PCR: SARS Coronavirus 2 by RT PCR: NEGATIVE

## 2021-12-09 LAB — MRSA NEXT GEN BY PCR, NASAL: MRSA by PCR Next Gen: NOT DETECTED

## 2021-12-09 NOTE — ED Notes (Signed)
Pt very sleepy. Vss. NAD noted.

## 2021-12-09 NOTE — ED Notes (Signed)
Pt is able to state name, DOB, and place. Pt is unable to recall year. Pt can follow commands. Pt is resting, responds to voice.

## 2021-12-09 NOTE — Progress Notes (Addendum)
Progress Note    Craig Wilson  KZL:[id number] DOB: Jul 20, 1940  DOA: 12/08/2021 PCP: Adin Hector, MD      Brief Narrative:    Medical records reviewed and are as summarized below:  Craig Wilson is a 81 y.o. male with medical history significant for bipolar disorder, Parkinson's disease, hypertension, hyperlipidemia, frequent falls, unsteady gait, chronic diarrhea, tremor, anemia, thrombocytopenia, degenerative joint disease, gout, history of prostate cancer, sleep apnea.  He was brought to the hospital because of fever and altered mental status.  Etiology of his symptoms is unclear.  He was started on IV fluids and empiric IV antibiotics        Assessment/Plan:   Principal Problem:   Acute metabolic encephalopathy Active Problems:   Parkinson's disease ([place])   Bipolar disorder (H[facility])   Thrombocytopenia (H[facility])   Fever    Body mass index is 24.29 kg/m.    Fever, SIRS: Etiology unclear.  Checks x-ray and urinalysis were unremarkable.  Respiratory viral panel was negative.  Continue empiric IV antibiotics.  Follow-up blood cultures.  Acute metabolic encephalopathy: Improved  Sinus bradycardia: Asymptomatic monitor heart rate  Thrombocytopenia: Platelet count is trending down (107 to 88).  Platelet count was 144 in January 2023.  Repeat CBC.  Parkinson's disease: Continue Sinemet  Bipolar disorder: Continue lithium.  Lithium level was normal  Frequent falls at home, generalized weakness: Outpatient PT was recommended by PT.   Other comorbidities include type II DM, frequent falls at home, unsteady gait, tremor, chronic diarrhea, gout,  Diet Order             Diet heart healthy/carb modified Room service appropriate? Yes; Fluid consistency: Thin  Diet effective now                            Consultants: None  Procedures: None    Medications:    aspirin EC  81 mg Oral Daily   atorvastatin  40 mg Oral QPM    carbidopa-levodopa  1 tablet Oral QID   enoxaparin (LOVENOX) injection  40 mg Subcutaneous Q24H   lithium carbonate  300 mg Oral BID   traZODone  50 mg Oral QHS   Continuous Infusions:  ceFEPime (MAXIPIME) IV 2 g (12/09/21 1413)   lactated ringers 50 mL/hr at 12/09/21 0105   [START ON 12/10/2021] vancomycin       Anti-infectives (From admission, onward)    Start     Dose/Rate Route Frequency Ordered Stop   12/10/21 0100  vancomycin (VANCOREADY) IVPB 1500 mg/300 mL        1,500 mg 150 mL/hr over 120 Minutes Intravenous Every 24 hours 12/08/21 2256     12/09/21 0600  ceFEPIme (MAXIPIME) 2 g in sodium chloride 0.9 % 100 mL IVPB        2 g 200 mL/hr over 30 Minutes Intravenous Every 8 hours 12/08/21 2257     12/08/21 2200  vancomycin (VANCOREADY) IVPB 1750 mg/350 mL        1,750 mg 175 mL/hr over 120 Minutes Intravenous  Once 12/08/21 2148 12/09/21 0312   12/08/21 2145  ceFEPIme (MAXIPIME) 2 g in sodium chloride 0.9 % 100 mL IVPB        2 g 200 mL/hr over 30 Minutes Intravenous  Once 12/08/21 2137 12/09/21 0039   12/08/21 2145  metroNIDAZOLE (FLAGYL) IVPB 500 mg        500 mg 100 mL/hr over  60 Minutes Intravenous  Once 12/08/21 2137 12/09/21 0312   12/08/21 2145  vancomycin (VANCOCIN) IVPB 1000 mg/200 mL premix  Status:  Discontinued        1,000 mg 200 mL/hr over 60 Minutes Intravenous  Once 12/08/21 2137 12/08/21 2148              Family Communication/Anticipated D/C date and plan/Code Status   DVT prophylaxis: enoxaparin (LOVENOX) injection 40 mg Start: 12/08/21 2245     Code Status: Full Code  Family Communication: None Disposition Plan: Plan to discharge home in 2 to 3 days   Status is: Inpatient Remains inpatient appropriate because: On IV antibiotics       Subjective:   Interval events noted.  No headache, neck pain or stiffness, cough, shortness of breath, chest pain, urinary symptoms fever or chills.  He complains of generalized weakness and  requested PT evaluation because of falls at home.  Objective:    Vitals:   12/09/21 1330 12/09/21 1405 12/09/21 1430 12/09/21 1500  BP: (!) 118/45 (!) 160/40 (!) 133/53 (!) 144/47  Pulse: (!) 58 61 (!) 55 (!) 54  Resp: 13 16 (!) 21 19  Temp:      TempSrc:      SpO2: 98% 100% 100% 100%  Weight:      Height:       No data found.   Intake/Output Summary (Last 24 hours) at 12/09/2021 1516 Last data filed at 12/09/2021 6761 Gross per 24 hour  Intake 1101.19 ml  Output 1000 ml  Net 101.19 ml   Filed Weights   12/08/21 2002  Weight: 79 kg    Exam:  GEN: NAD SKIN: Redness on the buttocks EYES: EOMI ENT: MMM, no neck stiffness CV: RRR PULM: CTA B ABD: soft, ND, NT, +BS CNS: AAO x 3, non focal EXT: No edema or tenderness        Data Reviewed:   I have personally reviewed following labs and imaging studies:  Labs: Labs show the following:   Basic Metabolic Panel: Recent Labs  Lab 12/08/21 2036 12/09/21 0515  NA 138 140  K 4.6 4.3  CL 107 111  CO2 24 23  GLUCOSE 131* 164*  BUN 21 19  CREATININE 0.97 0.94  CALCIUM 9.9 9.6   GFR Estimated Creatinine Clearance: 63.4 mL/min (by C-G formula based on SCr of 0.94 mg/dL). Liver Function Tests: Recent Labs  Lab 12/08/21 2036 12/09/21 0515  AST 13* 14*  ALT 17 6  ALKPHOS 81 83  BILITOT 0.8 0.7  PROT 7.0 7.0  ALBUMIN 4.4 4.2   No results for input(s): "LIPASE", "AMYLASE" in the last 168 hours. Recent Labs  Lab 12/09/21 0102  AMMONIA 20   Coagulation profile Recent Labs  Lab 12/08/21 2036  INR 1.1    CBC: Recent Labs  Lab 12/08/21 2036 12/09/21 0515  WBC 7.8 4.9  NEUTROABS 7.2  --   HGB 11.6* 11.5*  HCT 36.0* 35.7*  MCV 100.3* 102.0*  PLT 107* 88*   Cardiac Enzymes: No results for input(s): "CKTOTAL", "CKMB", "CKMBINDEX", "TROPONINI" in the last 168 hours. BNP (last 3 results) No results for input(s): "PROBNP" in the last 8760 hours. CBG: No results for input(s): "GLUCAP" in the  last 168 hours. D-Dimer: No results for input(s): "DDIMER" in the last 72 hours. Hgb A1c: No results for input(s): "HGBA1C" in the last 72 hours. Lipid Profile: No results for input(s): "CHOL", "HDL", "LDLCALC", "TRIG", "CHOLHDL", "LDLDIRECT" in the last 72 hours. Thyroid  function studies: Recent Labs    12/09/21 0102  TSH 1.044   Anemia work up: No results for input(s): "VITAMINB12", "FOLATE", "FERRITIN", "TIBC", "IRON", "RETICCTPCT" in the last 72 hours. Sepsis Labs: Recent Labs  Lab 12/08/21 2036 12/09/21 0102 12/09/21 0515  PROCALCITON 0.29  --   --   WBC 7.8  --  4.9  LATICACIDVEN 1.6 1.7  --     Microbiology Recent Results (from the past 240 hour(s))  Culture, blood (Routine x 2)     Status: None (Preliminary result)   Collection Time: 12/08/21  8:36 PM   Specimen: BLOOD  Result Value Ref Range Status   Specimen Description BLOOD BLOOD LEFT FOREARM  Final   Special Requests   Final    BOTTLES DRAWN AEROBIC AND ANAEROBIC Blood Culture results may not be optimal due to an inadequate volume of blood received in culture bottles   Culture   Final    NO GROWTH < 12 HOURS Performed at [facility], [address] [place], [place] [address]    Report Status PENDING  Incomplete  Culture, blood (Routine x 2)     Status: None (Preliminary result)   Collection Time: 12/08/21  9:22 PM   Specimen: BLOOD  Result Value Ref Range Status   Specimen Description BLOOD BLOOD RIGHT WRIST  Final   Special Requests   Final    BOTTLES DRAWN AEROBIC AND ANAEROBIC Blood Culture adequate volume   Culture   Final    NO GROWTH < 12 HOURS Performed at [facility], [address] [place], [place] [address]    Report Status PENDING  Incomplete  Respiratory (~20 pathogens) panel by PCR     Status: None   Collection Time: 12/09/21  1:02 AM   Specimen: Nasopharyngeal Swab; Respiratory  Result Value Ref Range Status   Adenovirus NOT DETECTED NOT DETECTED Final    Coronavirus 229E NOT DETECTED NOT DETECTED Final    Comment: (NOTE) The Coronavirus on the Respiratory Panel, DOES NOT test for the novel  Coronavirus (2019 nCoV)    Coronavirus HKU1 NOT DETECTED NOT DETECTED Final   Coronavirus NL63 NOT DETECTED NOT DETECTED Final   Coronavirus OC43 NOT DETECTED NOT DETECTED Final   Metapneumovirus NOT DETECTED NOT DETECTED Final   Rhinovirus / Enterovirus NOT DETECTED NOT DETECTED Final   Influenza A NOT DETECTED NOT DETECTED Final   Influenza B NOT DETECTED NOT DETECTED Final   Parainfluenza Virus 1 NOT DETECTED NOT DETECTED Final   Parainfluenza Virus 2 NOT DETECTED NOT DETECTED Final   Parainfluenza Virus 3 NOT DETECTED NOT DETECTED Final   Parainfluenza Virus 4 NOT DETECTED NOT DETECTED Final   Respiratory Syncytial Virus NOT DETECTED NOT DETECTED Final   Bordetella pertussis NOT DETECTED NOT DETECTED Final   Bordetella Parapertussis NOT DETECTED NOT DETECTED Final   Chlamydophila pneumoniae NOT DETECTED NOT DETECTED Final   Mycoplasma pneumoniae NOT DETECTED NOT DETECTED Final    Comment: Performed at [facility], [place] [address], [place], [place] [address]  SARS Coronavirus 2 by RT PCR (hospital order, performed in [facility] hospital lab) *cepheid single result test* Anterior Nasal Swab     Status: None   Collection Time: 12/09/21  1:02 AM   Specimen: Anterior Nasal Swab  Result Value Ref Range Status   SARS Coronavirus 2 by RT PCR NEGATIVE NEGATIVE Final    Comment: (NOTE) SARS-CoV-2 target nucleic acids are NOT DETECTED.  The SARS-CoV-2 RNA is generally detectable in  upper and lower respiratory specimens during the acute phase of infection. The lowest concentration of SARS-CoV-2 viral copies this assay can detect is 250 copies / mL. A negative result does not preclude SARS-CoV-2 infection and should not be used as the sole basis for treatment or other patient management decisions.  A negative result may occur with improper  specimen collection / handling, submission of specimen other than nasopharyngeal swab, presence of viral mutation(s) within the areas targeted by this assay, and inadequate number of viral copies (<250 copies / mL). A negative result must be combined with clinical observations, patient history, and epidemiological information.  Fact Sheet for Patients:   https://www.patel.info/  Fact Sheet for Healthcare Providers: https://hall.com/  This test is not yet approved or  cleared by the [place] FDA and has been authorized for detection and/or diagnosis of SARS-CoV-2 by FDA under an Emergency Use Authorization (EUA).  This EUA will remain in effect (meaning this test can be used) for the duration of the COVID-19 declaration under Section 564(b)(1) of the Act, 21 U.S.C. section 360bbb-3(b)(1), unless the authorization is terminated or revoked sooner.  Performed at [facility], [address] [place], [place] [address]     Procedures and diagnostic studies:  DG HIP UNILAT WITH PELVIS 2-3 VIEWS LEFT  Result Date: 12/09/2021 CLINICAL DATA:  Persistent left hip pain, initial encounter EXAM: DG HIP (WITH OR WITHOUT PELVIS) 3V LEFT COMPARISON:  06/24/2021 FINDINGS: Pelvic ring is intact. Postsurgical changes are noted consistent with prior prostatectomy. 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 No acute fracture or dislocation is noted. No soft tissue abnormality is seen. IMPRESSION: No acute fracture noted. Electronically Signed   By: [name] M.D.   On: 12/09/2021 00:44   CT HEAD WO CONTRAST (5MM)  Result Date: 12/08/2021 CLINICAL DATA:  Mental status change, unknown cause EXAM: CT HEAD WITHOUT CONTRAST TECHNIQUE: Contiguous axial images were obtained from the base of the skull through the vertex without intravenous contrast. RADIATION DOSE REDUCTION: This exam was performed according to the  departmental dose-optimization program which includes automated exposure control, adjustment of the mA and/or kV according to patient size and/or use of iterative reconstruction technique. COMPARISON:  05/06/2021 FINDINGS: Brain: No acute intracranial abnormality. Specifically, no hemorrhage, hydrocephalus, mass lesion, acute infarction, or significant intracranial injury. Vascular: No hyperdense vessel or unexpected calcification. Skull: No acute calvarial abnormality. Sinuses/Orbits: No acute findings Other: None IMPRESSION: No acute intracranial abnormality. Electronically Signed   By: Rolm Baptise M.D.   On: 12/08/2021 22:04   DG Chest Port 1 View  Result Date: 12/08/2021 CLINICAL DATA:  Suspected sepsis, weakness, fever, shortness of breath EXAM: PORTABLE CHEST 1 VIEW COMPARISON:  05/06/2021 FINDINGS: The heart size and mediastinal contours are within normal limits. Both lungs are clear. The visualized skeletal structures are unremarkable. IMPRESSION: No active disease. Electronically Signed   By: Rolm Baptise M.D.   On: 12/08/2021 20:35               LOS: 1 day   Traeson Dusza  [facility]   Pager on [email or web address]. If 7PM-7AM, please contact night-coverage at [email or web address]     12/09/2021, 3:16 PM

## 2021-12-09 NOTE — ED Notes (Signed)
PT with patient, pt assisted in changing of bedding and gown.

## 2021-12-09 NOTE — ED Notes (Signed)
Assumed care of patient. Patient sleeping, awakens to verbal stimuli. Able to state name, DOB and that he is in the local hospital. Unable to recall correct year and appropriately states he is in the hospital for a fever. Patient able to follow very simple commands, but when asked to smile opens eyes wide and when asked to squeeze fingers moves arms up away from body. No focal weakness noted. Reports continued left hip pain. 700 ml urine emptied from purewick and morning labs obtained.

## 2021-12-09 NOTE — Evaluation (Signed)
Physical Therapy Evaluation Patient Details Name: Craig Wilson MRN: [id number] DOB: 10/21/1940 Today's Date: 12/09/2021  History of Present Illness  Pt is an 81 y/o M admitted on 12/08/21 after presenting with c/o AMS. Pt noted to be febrile in the ED. Pt is being treated for SIRS of unknown etiology & acute metabolic encephalopathy. PMH: bipolar disorder, parkinson's disease, HTN, HLD, depression, DJD, diabetic neuropathy, prostate CA, DM2, gout, dyspnea, tremor  Clinical Impression  Pt seen for PT evaluation with pt's wife Craig Wilson) present. Prior to admission pt was residing in a condo with level entry with his wife, still driving, ambulatory with a QC, but multiple falls & broken bones over the past 6 months. Pt is incontinent of urine at baseline & noted to be soiled upon PT arrival. PT assisted with changing gown, bed linens & donning brief. Pt c/o buttocks soreness/numbness with redness noted & nurse notified with plans to put on sacral patch. Pt ambulates with RUE HHA to simulate cane use with min assist so provided with RW & pt then able to ambulate with CGA. Educated pt on need to use RW upon d/c & made recommendation of HHPT but pt's wife voices they'd prefer OPPT services as their home is cluttered & she attends OPPT herself. Will continue to follow pt acutely to address balance, safety awareness, and gait with LRAD.       Recommendations for follow up therapy are one component of a multi-disciplinary discharge planning process, led by the attending physician.  Recommendations may be updated based on patient status, additional functional criteria and insurance authorization.  Follow Up Recommendations Outpatient PT (recommend HHPT but pt/wife prefer OPPT noting their home is cluttered & pt's wife also attends OPPT)      Assistance Recommended at Discharge Frequent or constant Supervision/Assistance  Patient can return home with the following  A little help with  bathing/dressing/bathroom;A little help with walking and/or transfers;Assistance with cooking/housework;Assist for transportation;Direct supervision/assist for medications management    Equipment Recommendations Rolling walker (2 wheels)  Recommendations for Other Services  OT consult    Functional Status Assessment Patient has had a recent decline in their functional status and demonstrates the ability to make significant improvements in function in a reasonable and predictable amount of time.     Precautions / Restrictions Precautions Precautions: Fall Restrictions Weight Bearing Restrictions: No      Mobility  Bed Mobility Overal bed mobility: Needs Assistance Bed Mobility: Supine to Sit, Sit to Supine, Rolling Rolling: Modified independent (Device/Increase time)   Supine to sit: Supervision, HOB elevated Sit to supine: Supervision, HOB elevated        Transfers Overall transfer level: Needs assistance Equipment used: None Transfers: Sit to/from Stand Sit to Stand: Min guard                Ambulation/Gait Ambulation/Gait assistance: Min assist, Min guard Gait Distance (Feet): 70 Feet (+ 70 ft) Assistive device: None, Rolling walker (2 wheels) Gait Pattern/deviations: Decreased step length - right, Decreased step length - left, Decreased dorsiflexion - left, Decreased dorsiflexion - right, Knee flexed in stance - right, Shuffle Gait velocity: decreased     General Gait Details: decreased heel strike BLE, pt maintains slight hip/knee flexion throughout gait; pt ambulates initial 70 ft without AD & min assist, PT then provided pt with RW & pt ambulates with improved balance with CGA  Stairs            Wheelchair Mobility    Modified Rankin (  Stroke Patients Only)       Balance Overall balance assessment: Needs assistance, History of Falls Sitting-balance support: Feet supported, Bilateral upper extremity supported Sitting balance-Leahy Scale: Fair      Standing balance support: During functional activity, Single extremity supported Standing balance-Leahy Scale: Poor                               Pertinent Vitals/Pain Pain Assessment Pain Assessment: Faces Faces Pain Scale: Hurts little more Pain Location: buttocks Pain Descriptors / Indicators: Discomfort, Numbness, Sore Pain Intervention(s): Monitored during session (PT observed redness to sacral area & nurse notified & with intentions of placing sacral pad to prevent skin breakdown; pt educated on need to lay in sidelying with pt in L sidelying at end of session reporting that alleviates pain)    Home Living Family/patient expects to be discharged to:: Private residence Living Arrangements: Spouse/significant other Available Help at Discharge: Family;Available 24 hours/day Type of Home: House (condo) Home Access: Level entry       Home Layout: One level Home Equipment: Radio producer - quad      Prior Function Prior Level of Function : Driving;History of Falls (last six months)             Mobility Comments: Pt was ambulatory with QC but endorses many falls in past 6 months (per wife, pt has broken B ankles & 6 other bones in past 6 months), pt still drives (wife does not) ADLs Comments: urinary incontinence at baseline     Hand Dominance        Extremity/Trunk Assessment   Upper Extremity Assessment Upper Extremity Assessment: Generalized weakness    Lower Extremity Assessment Lower Extremity Assessment: Generalized weakness    Cervical / Trunk Assessment Cervical / Trunk Assessment:  (rounded shoulders)  Communication   Communication: HOH  Cognition Arousal/Alertness: Awake/alert Behavior During Therapy: WFL for tasks assessed/performed Overall Cognitive Status: Impaired/Different from baseline Area of Impairment: Orientation, Safety/judgement, Awareness, Following commands                 Orientation Level: Person,  Place     Following Commands: Follows one step commands consistently, Follows one step commands with increased time Safety/Judgement: Decreased awareness of deficits Awareness: Anticipatory            General Comments General comments (skin integrity, edema, etc.): Pt c/o feeling "drunk" during standing/gait but unable to distinguish if he feels dizzy vs unsteady on feet, BP in RUE in sidelying at end of session 160/40 mmHg MAP 71    Exercises     Assessment/Plan    PT Assessment Patient needs continued PT services  PT Problem List Decreased strength;Decreased mobility;Decreased safety awareness;Decreased balance;Decreased knowledge of use of DME       PT Treatment Interventions DME instruction;Therapeutic exercise;Gait training;Manual techniques;Neuromuscular re-education;Modalities;Balance training;Functional mobility training;Patient/family education;Therapeutic activities;Cognitive remediation    PT Goals (Current goals can be found in the Care Plan section)  Acute Rehab PT Goals Patient Stated Goal: get better, go home PT Goal Formulation: With patient/family Time For Goal Achievement: 12/23/21 Potential to Achieve Goals: Good    Frequency Min 2X/week     Co-evaluation               AM-PAC PT "6 Clicks" Mobility  Outcome Measure Help needed turning from your back to your side while in a flat bed without using bedrails?: None Help needed moving from lying on your  back to sitting on the side of a flat bed without using bedrails?: A Little Help needed moving to and from a bed to a chair (including a wheelchair)?: A Little Help needed standing up from a chair using your arms (e.g., wheelchair or bedside chair)?: A Little Help needed to walk in hospital room?: A Little Help needed climbing 3-5 steps with a railing? : A Little 6 Click Score: 19    End of Session   Activity Tolerance: Patient tolerated treatment well Patient left: in bed;with call bell/phone  within reach;with nursing/sitter in room;with family/visitor present Nurse Communication: Mobility status (redness on buttocks) PT Visit Diagnosis: Unsteadiness on feet (R26.81);Muscle weakness (generalized) (M62.81);History of falling (Z91.81)    Time: 9480-1655 PT Time Calculation (min) (ACUTE ONLY): 24 min   Charges:   PT Evaluation $PT Eval Low Complexity: 1 Low PT Treatments $Therapeutic Activity: 8-22 mins        Lavone Nian, PT, DPT 12/09/21, 2:41 PM   Waunita Schooner 12/09/2021, 2:41 PM

## 2021-12-09 NOTE — ED Notes (Signed)
Pt alert and oriented at this time, resting in bed. Coffee given to wife and patient. No needs verbalized at this time.

## 2021-12-09 NOTE — ED Notes (Signed)
Pt alert and oriented to self, place, year and almost month (stating its June with quick correction to July) Pt assisted to toilet for BM, but only passed flatus. Pt assisted back to bed with assist x1 and 4 point walker. Pt back to bed, NAD, and no needs verbalized.

## 2021-12-09 NOTE — ED Notes (Signed)
Pt stating "my butt hurts" Redness noted on buttocks, foam pad placed

## 2021-12-10 DIAGNOSIS — R509 Fever, unspecified: Secondary | ICD-10-CM | POA: Diagnosis not present

## 2021-12-10 DIAGNOSIS — D696 Thrombocytopenia, unspecified: Secondary | ICD-10-CM

## 2021-12-10 DIAGNOSIS — G9341 Metabolic encephalopathy: Secondary | ICD-10-CM | POA: Diagnosis not present

## 2021-12-10 LAB — CBC
HCT: 33 % — ABNORMAL LOW (ref 39.0–52.0)
Hemoglobin: 10.8 g/dL — ABNORMAL LOW (ref 13.0–17.0)
MCH: 32.7 pg (ref 26.0–34.0)
MCHC: 32.7 g/dL (ref 30.0–36.0)
MCV: 100 fL (ref 80.0–100.0)
Platelets: 78 10*3/uL — ABNORMAL LOW (ref 150–400)
RBC: 3.3 MIL/uL — ABNORMAL LOW (ref 4.22–5.81)
RDW: 12.3 % (ref 11.5–15.5)
WBC: 4.4 10*3/uL (ref 4.0–10.5)
nRBC: 0 % (ref 0.0–0.2)

## 2021-12-10 LAB — BASIC METABOLIC PANEL
Anion gap: 7 (ref 5–15)
BUN: 16 mg/dL (ref 8–23)
CO2: 23 mmol/L (ref 22–32)
Calcium: 9.8 mg/dL (ref 8.9–10.3)
Chloride: 112 mmol/L — ABNORMAL HIGH (ref 98–111)
Creatinine, Ser: 0.96 mg/dL (ref 0.61–1.24)
GFR, Estimated: >60 mL/min (ref >60)
Glucose, Bld: 139 mg/dL — ABNORMAL HIGH (ref 70–99)
Potassium: 4.6 mmol/L (ref 3.5–5.1)
Sodium: 142 mmol/L (ref 135–145)

## 2021-12-10 LAB — URINE CULTURE

## 2021-12-10 LAB — GLUCOSE, CAPILLARY: Glucose-Capillary: 133 mg/dL — ABNORMAL HIGH (ref 70–99)

## 2021-12-10 NOTE — Evaluation (Signed)
Occupational Therapy Evaluation Patient Details Name: Craig Wilson MRN: [id number] DOB: May 06, 1941 Today's Date: 12/10/2021   History of Present Illness Pt is an 81 y/o M admitted on 12/08/21 after presenting with c/o AMS. Pt noted to be febrile in the ED. Pt is being treated for SIRS of unknown etiology & acute metabolic encephalopathy. PMH: bipolar disorder, parkinson's disease, HTN, HLD, depression, DJD, diabetic neuropathy, prostate CA, DM2, gout, dyspnea, tremor   Clinical Impression   Pt was seen for OT evaluation this date. Prior to hospital admission, pt was ambulating with a cane and generally indep with ADL. Pt lives with his spouse who does not drive. Currently pt demonstrates impairments as described below (See OT problem list) which functionally limit his ability to perform ADL/self-care tasks. Pt currently requires SBA-CGA for ADL transfers with VC for hand placement, CGA for pericare in standing, and MOD A for sock mgt from seated position. Pt ambulated from EOB to the bathroom with CGA +RW and no LOB. Pt endorsing more steady with RW as well. Pt instructed in ADL transfers/RW mgt, falls prevention, recommended use of hallway lighting (night light, motion activated lights, etc) in his home to decrease falls risk with overnight toileting needs. Pt verbalized understanding and would benefit from skilled OT services to address noted impairments and functional limitations (see below for any additional details) in order to maximize safety and independence while minimizing falls risk and caregiver burden. Upon hospital discharge, recommend OP OT to maximize pt safety and return to functional independence during meaningful occupations of daily life.     Recommendations for follow up therapy are one component of a multi-disciplinary discharge planning process, led by the attending physician.  Recommendations may be updated based on patient status, additional functional criteria and insurance  authorization.   Follow Up Recommendations  Outpatient OT    Assistance Recommended at Discharge Frequent or constant Supervision/Assistance  Patient can return home with the following A little help with walking and/or transfers;A little help with bathing/dressing/bathroom;Assistance with cooking/housework;Assist for transportation;Help with stairs or ramp for entrance    Functional Status Assessment  Patient has had a recent decline in their functional status and demonstrates the ability to make significant improvements in function in a reasonable and predictable amount of time.  Equipment Recommendations  BSC/3in1;Other (comment) (2WW)    Recommendations for Other Services       Precautions / Restrictions Precautions Precautions: Fall Restrictions Weight Bearing Restrictions: No      Mobility Bed Mobility Overal bed mobility: Needs Assistance Bed Mobility: Supine to Sit     Supine to sit: Min guard, HOB elevated          Transfers Overall transfer level: Needs assistance Equipment used: Rolling walker (2 wheels) Transfers: Sit to/from Stand Sit to Stand: Min guard           General transfer comment: SBA      Balance Overall balance assessment: Needs assistance, History of Falls Sitting-balance support: Feet supported Sitting balance-Leahy Scale: Fair     Standing balance support: During functional activity, Single extremity supported, Bilateral upper extremity supported Standing balance-Leahy Scale: Fair Standing balance comment: able to let go with 1 UE to complete pericare after toileting                           ADL either performed or assessed with clinical judgement   ADL  Vision         Perception     Praxis      Pertinent Vitals/Pain Pain Assessment Pain Assessment: Faces Faces Pain Scale: Hurts a little bit Pain Location: buttocks Pain Descriptors /  Indicators: Discomfort, Numbness, Sore Pain Intervention(s): Monitored during session, Repositioned     Hand Dominance     Extremity/Trunk Assessment Upper Extremity Assessment Upper Extremity Assessment: Generalized weakness   Lower Extremity Assessment Lower Extremity Assessment: Generalized weakness       Communication Communication Communication: HOH   Cognition Arousal/Alertness: Awake/alert Behavior During Therapy: WFL for tasks assessed/performed Overall Cognitive Status: Within Functional Limits for tasks assessed                                 General Comments: alert and oriented, follows commands well     General Comments       Exercises Other Exercises Other Exercises: Pt instructed in ADL transfers/RW mgt, falls prevention, recommended use of hallway lighting (night light, motion activated lights, etc) in his home to decrease falls risk with overnight toileting needs   Shoulder Instructions      Home Living Family/patient expects to be discharged to:: Private residence Living Arrangements: Spouse/significant other Available Help at Discharge: Family;Available 24 hours/day Type of Home: House (condo) Home Access: Level entry     Home Layout: One level               Home Equipment: Standard Walker;Cane - quad          Prior Functioning/Environment Prior Level of Function : Driving;History of Falls (last six months)             Mobility Comments: Pt was ambulatory with QC but endorses many falls in past 6 months (per wife, pt has broken B ankles & 6 other bones in past 6 months), pt still drives (wife does not) ADLs Comments: urinary incontinence at baseline        OT Problem List: Decreased strength;Decreased activity tolerance;Impaired balance (sitting and/or standing);Decreased knowledge of use of DME or AE      OT Treatment/Interventions: Self-care/ADL training;Therapeutic exercise;Therapeutic activities;Energy  conservation;DME and/or AE instruction;Patient/family education;Balance training    OT Goals(Current goals can be found in the care plan section) Acute Rehab OT Goals Patient Stated Goal: go home OT Goal Formulation: With patient Time For Goal Achievement: 12/24/21 Potential to Achieve Goals: Good ADL Goals Pt Will Perform Lower Body Dressing: sit to/from stand;with modified independence Pt Will Transfer to Toilet: with modified independence;ambulating (elevated commode, LRAD) Pt Will Perform Toileting - Clothing Manipulation and hygiene: with modified independence Additional ADL Goal #1: Pt will verbalize plan to implememt at least 1 learned falls prevention strategy.  OT Frequency: Min 2X/week    Co-evaluation              AM-PAC OT "6 Clicks" Daily Activity     Outcome Measure Help from another person eating meals?: None Help from another person taking care of personal grooming?: None Help from another person toileting, which includes using toliet, bedpan, or urinal?: A Little Help from another person bathing (including washing, rinsing, drying)?: A Little Help from another person to put on and taking off regular upper body clothing?: None Help from another person to put on and taking off regular lower body clothing?: A Little 6 Click Score: 21   End of Session Equipment Utilized During Treatment: Gait belt;Rolling walker (2  wheels) Nurse Communication: Other (comment) (assist with bed change)  Activity Tolerance: Patient tolerated treatment well Patient left: in chair;with call bell/phone within reach;with chair alarm set  OT Visit Diagnosis: Other abnormalities of gait and mobility (R26.89);Repeated falls (R29.6);Muscle weakness (generalized) (M62.81)                Time: 0375-4360 OT Time Calculation (min): 47 min Charges:  OT General Charges $OT Visit: 1 Visit OT Evaluation $OT Eval Moderate Complexity: 1 Mod OT Treatments $Self Care/Home Management : 23-37  mins  Ardeth Perfect., MPH, MS, OTR/L ascom [phone] 12/10/21, 2:05 PM

## 2021-12-10 NOTE — Progress Notes (Signed)
Progress Note    Craig Wilson  BEE:[id number] DOB: 07/21/1940  DOA: 12/08/2021 PCP: Adin Hector, MD      Brief Narrative:    Medical records reviewed and are as summarized below:  Craig Wilson is a 81 y.o. male with medical history significant for bipolar disorder, Parkinson's disease, hypertension, hyperlipidemia, frequent falls, unsteady gait, chronic diarrhea, tremor, anemia, thrombocytopenia, degenerative joint disease, gout, history of prostate cancer, sleep apnea.  He was brought to the hospital because of fever and altered mental status.  Etiology of his symptoms is unclear.  He was started on IV fluids and empiric IV antibiotics        Assessment/Plan:   Principal Problem:   Acute metabolic encephalopathy Active Problems:   Parkinson's disease ([place])   Bipolar disorder ([facility])   Thrombocytopenia ([facility])   Fever    Body mass index is 24.29 kg/m.    Fever, SIRS: Etiology unclear.  Chest x-ray and urinalysis were unremarkable.  Respiratory viral panel was negative.  Discontinue IV vancomycin.  Continue IV cefepime.  Follow-up blood cultures.  Plan to discharge home tomorrow if blood cultures remain negative.  Acute metabolic encephalopathy: Improved  Sinus bradycardia: Asymptomatic monitor heart rate  Thrombocytopenia: Platelet count is still trending down (107 to 78) Platelet count was 144 in January 2023.  Repeat CBC tomorrow  Parkinson's disease: Continue Sinemet  Bipolar disorder: Continue lithium.  Lithium level was normal  Frequent falls at home, generalized weakness: Outpatient PT was recommended by PT.   Other comorbidities include type II DM, frequent falls at home, unsteady gait, tremor, chronic diarrhea, gout,  Diet Order             Diet heart healthy/carb modified Room service appropriate? Yes; Fluid consistency: Thin  Diet effective now                             Consultants: None  Procedures: None    Medications:    aspirin EC  81 mg Oral Daily   atorvastatin  40 mg Oral QPM   carbidopa-levodopa  1 tablet Oral QID   enoxaparin (LOVENOX) injection  40 mg Subcutaneous Q24H   lithium carbonate  300 mg Oral BID   traZODone  50 mg Oral QHS   Continuous Infusions:  ceFEPime (MAXIPIME) IV 2 g (12/10/21 0540)     Anti-infectives (From admission, onward)    Start     Dose/Rate Route Frequency Ordered Stop   12/10/21 0100  vancomycin (VANCOREADY) IVPB 1500 mg/300 mL  Status:  Discontinued        1,500 mg 150 mL/hr over 120 Minutes Intravenous Every 24 hours 12/08/21 2256 12/10/21 1325   12/09/21 0600  ceFEPIme (MAXIPIME) 2 g in sodium chloride 0.9 % 100 mL IVPB        2 g 200 mL/hr over 30 Minutes Intravenous Every 8 hours 12/08/21 2257     12/08/21 2200  vancomycin (VANCOREADY) IVPB 1750 mg/350 mL        1,750 mg 175 mL/hr over 120 Minutes Intravenous  Once 12/08/21 2148 12/09/21 0312   12/08/21 2145  ceFEPIme (MAXIPIME) 2 g in sodium chloride 0.9 % 100 mL IVPB        2 g 200 mL/hr over 30 Minutes Intravenous  Once 12/08/21 2137 12/09/21 0039   12/08/21 2145  metroNIDAZOLE (FLAGYL) IVPB 500 mg        500 mg 100  mL/hr over 60 Minutes Intravenous  Once 12/08/21 2137 12/09/21 0312   12/08/21 2145  vancomycin (VANCOCIN) IVPB 1000 mg/200 mL premix  Status:  Discontinued        1,000 mg 200 mL/hr over 60 Minutes Intravenous  Once 12/08/21 2137 12/08/21 2148              Family Communication/Anticipated D/C date and plan/Code Status   DVT prophylaxis: enoxaparin (LOVENOX) injection 40 mg Start: 12/08/21 2245     Code Status: Full Code  Family Communication: None Disposition Plan: Plan to discharge home tomorrow   Status is: Inpatient Remains inpatient appropriate because: On IV antibiotics, follow-up blood cultures       Subjective:   Interval events noted.  No fever, chills, headache, nausea  or vomiting  Objective:    Vitals:   12/09/21 2212 12/10/21 0021 12/10/21 0543 12/10/21 0759  BP: (!) 138/42 (!) 163/49 (!) 171/56 (!) 150/53  Pulse: (!) 56 (!) 53 70 60  Resp: '18 18 18 18  '$ Temp: 98.5 F (36.9 C) 98 F (36.7 C) 97.6 F (36.4 C) 98 F (36.7 C)  TempSrc: Oral Oral Oral   SpO2: 94% 97% 98% 100%  Weight:      Height:       No data found.   Intake/Output Summary (Last 24 hours) at 12/10/2021 1325 Last data filed at 12/10/2021 0428 Gross per 24 hour  Intake 1332.98 ml  Output 1100 ml  Net 232.98 ml   Filed Weights   12/08/21 2002  Weight: 79 kg    Exam:  GEN: NAD SKIN: No rash EYES: EOMI ENT: MMM CV: RRR PULM: CTA B ABD: soft, ND, NT, +BS CNS: AAO x 3, non focal EXT: No edema or tenderness        Data Reviewed:   I have personally reviewed following labs and imaging studies:  Labs: Labs show the following:   Basic Metabolic Panel: Recent Labs  Lab 12/08/21 2036 12/09/21 0515 12/10/21 0452  NA 138 140 142  K 4.6 4.3 4.6  CL 107 111 112*  CO2 '24 23 23  '$ GLUCOSE 131* 164* 139*  BUN '21 19 16  '$ CREATININE 0.97 0.94 0.96  CALCIUM 9.9 9.6 9.8   GFR Estimated Creatinine Clearance: 62.1 mL/min (by C-G formula based on SCr of 0.96 mg/dL). Liver Function Tests: Recent Labs  Lab 12/08/21 2036 12/09/21 0515  AST 13* 14*  ALT 17 6  ALKPHOS 81 83  BILITOT 0.8 0.7  PROT 7.0 7.0  ALBUMIN 4.4 4.2   No results for input(s): "LIPASE", "AMYLASE" in the last 168 hours. Recent Labs  Lab 12/09/21 0102  AMMONIA 20   Coagulation profile Recent Labs  Lab 12/08/21 2036  INR 1.1    CBC: Recent Labs  Lab 12/08/21 2036 12/09/21 0515 12/10/21 0452  WBC 7.8 4.9 4.4  NEUTROABS 7.2  --   --   HGB 11.6* 11.5* 10.8*  HCT 36.0* 35.7* 33.0*  MCV 100.3* 102.0* 100.0  PLT 107* 88* 78*   Cardiac Enzymes: No results for input(s): "CKTOTAL", "CKMB", "CKMBINDEX", "TROPONINI" in the last 168 hours. BNP (last 3 results) No results for  input(s): "PROBNP" in the last 8760 hours. CBG: Recent Labs  Lab 12/10/21 0758  GLUCAP 133*   D-Dimer: No results for input(s): "DDIMER" in the last 72 hours. Hgb A1c: No results for input(s): "HGBA1C" in the last 72 hours. Lipid Profile: No results for input(s): "CHOL", "HDL", "LDLCALC", "TRIG", "CHOLHDL", "LDLDIRECT" in the last 72  hours. Thyroid function studies: Recent Labs    12/09/21 0102  TSH 1.044   Anemia work up: No results for input(s): "VITAMINB12", "FOLATE", "FERRITIN", "TIBC", "IRON", "RETICCTPCT" in the last 72 hours. Sepsis Labs: Recent Labs  Lab 12/08/21 2036 12/09/21 0102 12/09/21 0515 12/10/21 0452  PROCALCITON 0.29  --   --   --   WBC 7.8  --  4.9 4.4  LATICACIDVEN 1.6 1.7  --   --     Microbiology Recent Results (from the past 240 hour(s))  Culture, blood (Routine x 2)     Status: None (Preliminary result)   Collection Time: 12/08/21  8:36 PM   Specimen: BLOOD  Result Value Ref Range Status   Specimen Description BLOOD BLOOD LEFT FOREARM  Final   Special Requests   Final    BOTTLES DRAWN AEROBIC AND ANAEROBIC Blood Culture results may not be optimal due to an inadequate volume of blood received in culture bottles   Culture   Final    NO GROWTH 2 DAYS Performed at [facility], [address] [place], [place] [address]    Report Status PENDING  Incomplete  Urine Culture     Status: Abnormal   Collection Time: 12/08/21  8:59 PM   Specimen: Urine, Clean Catch  Result Value Ref Range Status   Specimen Description   Final    URINE, CLEAN CATCH Performed at [facility], [address] [place], [place] [address]    Special Requests   Final    NONE Performed at [facility], [address] [place], [place] [address]    Culture MULTIPLE SPECIES PRESENT, SUGGEST RECOLLECTION (A)  Final   Report Status 12/10/2021 FINAL  Final  Culture, blood (Routine x 2)     Status: None (Preliminary result)   Collection Time:  12/08/21  9:22 PM   Specimen: BLOOD  Result Value Ref Range Status   Specimen Description BLOOD BLOOD RIGHT WRIST  Final   Special Requests   Final    BOTTLES DRAWN AEROBIC AND ANAEROBIC Blood Culture adequate volume   Culture   Final    NO GROWTH 2 DAYS Performed at [facility], [address] [place],  [address]    Report Status PENDING  Incomplete  Respiratory (~20 pathogens) panel by PCR     Status: None   Collection Time: 12/09/21  1:02 AM   Specimen: Nasopharyngeal Swab; Respiratory  Result Value Ref Range Status   Adenovirus NOT DETECTED NOT DETECTED Final   Coronavirus 229E NOT DETECTED NOT DETECTED Final    Comment: (NOTE) The Coronavirus on the Respiratory Panel, DOES NOT test for the novel  Coronavirus (2019 nCoV)    Coronavirus HKU1 NOT DETECTED NOT DETECTED Final   Coronavirus NL63 NOT DETECTED NOT DETECTED Final   Coronavirus OC43 NOT DETECTED NOT DETECTED Final   Metapneumovirus NOT DETECTED NOT DETECTED Final   Rhinovirus / Enterovirus NOT DETECTED NOT DETECTED Final   Influenza A NOT DETECTED NOT DETECTED Final   Influenza B NOT DETECTED NOT DETECTED Final   Parainfluenza Virus 1 NOT DETECTED NOT DETECTED Final   Parainfluenza Virus 2 NOT DETECTED NOT DETECTED Final   Parainfluenza Virus 3 NOT DETECTED NOT DETECTED Final   Parainfluenza Virus 4 NOT DETECTED NOT DETECTED Final   Respiratory Syncytial Virus NOT DETECTED NOT DETECTED Final   Bordetella pertussis NOT DETECTED NOT DETECTED Final   Bordetella Parapertussis NOT DETECTED NOT DETECTED Final   Chlamydophila pneumoniae NOT DETECTED NOT  DETECTED Final   Mycoplasma pneumoniae NOT DETECTED NOT DETECTED Final    Comment: Performed at [facility], [place] [address] [place], [place] [address]  SARS Coronavirus 2 by RT PCR (hospital order, performed in [facility] hospital lab) *cepheid single result test* Anterior Nasal Swab     Status: None   Collection Time: 12/09/21  1:02 AM    Specimen: Anterior Nasal Swab  Result Value Ref Range Status   SARS Coronavirus 2 by RT PCR NEGATIVE NEGATIVE Final    Comment: (NOTE) SARS-CoV-2 target nucleic acids are NOT DETECTED.  The SARS-CoV-2 RNA is generally detectable in upper and lower respiratory specimens during the acute phase of infection. The lowest concentration of SARS-CoV-2 viral copies this assay can detect is 250 copies / mL. A negative result does not preclude SARS-CoV-2 infection and should not be used as the sole basis for treatment or other patient management decisions.  A negative result may occur with improper specimen collection / handling, submission of specimen other than nasopharyngeal swab, presence of viral mutation(s) within the areas targeted by this assay, and inadequate number of viral copies (<250 copies / mL). A negative result must be combined with clinical observations, patient history, and epidemiological information.  Fact Sheet for Patients:   https://www.patel.info/  Fact Sheet for Healthcare Providers: https://hall.com/  This test is not yet approved or  cleared by the [place] FDA and has been authorized for detection and/or diagnosis of SARS-CoV-2 by FDA under an Emergency Use Authorization (EUA).  This EUA will remain in effect (meaning this test can be used) for the duration of the COVID-19 declaration under Section 564(b)(1) of the Act, 21 U.S.C. section 360bbb-3(b)(1), unless the authorization is terminated or revoked sooner.  Performed at [facility], [address] [place], [place] [address]   MRSA Next Gen by PCR, Nasal     Status: None   Collection Time: 12/09/21  5:55 PM   Specimen: Nasal Mucosa; Nasal Swab  Result Value Ref Range Status   MRSA by PCR Next Gen NOT DETECTED NOT DETECTED Final    Comment: (NOTE) The GeneXpert MRSA Assay (FDA approved for NASAL specimens only), is one component of a comprehensive  MRSA colonization surveillance program. It is not intended to diagnose MRSA infection nor to guide or monitor treatment for MRSA infections. Test performance is not FDA approved in patients less than 66 years old. Performed at [facility], [address] [employer] [place], [place] [address]     Procedures and diagnostic studies:  DG HIP UNILAT WITH PELVIS 2-3 VIEWS LEFT  Result Date: 12/09/2021 CLINICAL DATA:  Persistent left hip pain, initial encounter EXAM: DG HIP (WITH OR WITHOUT PELVIS) 3V LEFT COMPARISON:  06/24/2021 FINDINGS: Pelvic ring is intact. Postsurgical changes are noted consistent with prior prostatectomy. 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 No acute fracture or dislocation is noted. No soft tissue abnormality is seen. IMPRESSION: No acute fracture noted. Electronically Signed   By: [name] M.D.   On: 12/09/2021 00:44   CT HEAD WO CONTRAST (5MM)  Result Date: 12/08/2021 CLINICAL DATA:  Mental status change, unknown cause EXAM: CT HEAD WITHOUT CONTRAST TECHNIQUE: Contiguous axial images were obtained from the base of the skull through the vertex without intravenous contrast. RADIATION DOSE REDUCTION: This exam was performed according to the departmental dose-optimization program which includes automated exposure control, adjustment of the mA and/or kV according to patient size and/or use of iterative reconstruction  technique. COMPARISON:  05/06/2021 FINDINGS: Brain: No acute intracranial abnormality. Specifically, no hemorrhage, hydrocephalus, mass lesion, acute infarction, or significant intracranial injury. Vascular: No hyperdense vessel or unexpected calcification. Skull: No acute calvarial abnormality. Sinuses/Orbits: No acute findings Other: None IMPRESSION: No acute intracranial abnormality. Electronically Signed   By: Rolm Baptise M.D.   On: 12/08/2021 22:04   DG Chest Port 1 View  Result Date: 12/08/2021 CLINICAL DATA:  Suspected  sepsis, weakness, fever, shortness of breath EXAM: PORTABLE CHEST 1 VIEW COMPARISON:  05/06/2021 FINDINGS: The heart size and mediastinal contours are within normal limits. Both lungs are clear. The visualized skeletal structures are unremarkable. IMPRESSION: No active disease. Electronically Signed   By: Rolm Baptise M.D.   On: 12/08/2021 20:35               LOS: 2 days   Carmino Ocain  [facility]   Pager on [email or web address]. If 7PM-7AM, please contact night-coverage at [email or web address]     12/10/2021, 1:25 PM

## 2021-12-10 NOTE — Progress Notes (Signed)
Patient lethargic at start of shift. Would open eyes to voice sometimes,and to pain at other times and when he woke he would answer orientation questions appropriately. Vital signs rechecked and stable. Neuro exam completed, pt able to follow commands when awake. PO meds not given due to lethargy.  On reassessment patient's alertness has improved. Alert and oriented x4. Conversational, updated on plan of care. Vitals signs checked and stable. Patient denies further needs at this time.

## 2021-12-10 NOTE — Progress Notes (Signed)
Attempted to return pt's wife's phone call for update. No answer, will let oncoming nurse know she'd like an update.

## 2021-12-10 NOTE — Clinical Social Work Note (Signed)
Occupational Therapy * Physical Therapy * Speech Therapy          DATE ____7/10/23_______________ PATIENT NAME__[name]___________________ PATIENT MRN__[id number]__________________  DIAGNOSIS/DIAGNOSIS CODE ____Acute Metabolic Encephalopathy__________________ DATE OF DISCHARGE: ___07/11/23___________  PRIMARY CARE PHYSICIAN ____Bert Klien______________________ PCP PHONE/FAX__________[phone]_________________     Dear Provider (Name: __________________   Fax: ___________________________):   I certify that I have examined this patient and that occupational/physical/speech therapy is necessary on an outpatient basis.    The patient has expressed interest in completing their recommended course of therapy at your location.  Once a formal order from the patient's primary care physician has been obtained, please contact him/her to schedule an appointment for evaluation at your earliest convenience.   [ X ]  Physical Therapy Evaluate and Treat          [ X ]  Occupational Therapy Evaluate and Treat                                    [  ]  Speech Therapy Evaluate and Treat       The patient's primary care physician (listed above) must furnish and be responsible for a formal order such that the recommended services may be furnished while under the primary physician's care, and that the plan of care will be established and reviewed every 30 days (or more often if condition necessitates).   MD electronic signature noted below

## 2021-12-10 NOTE — Progress Notes (Signed)
Physical Therapy Treatment Patient Details Name: Craig Wilson MRN: [id number] DOB: 1941/03/16 Today's Date: 12/10/2021   History of Present Illness Pt is an 81 y/o M admitted on 12/08/21 after presenting with c/o AMS. Pt noted to be febrile in the ED. Pt is being treated for SIRS of unknown etiology & acute metabolic encephalopathy. PMH: bipolar disorder, parkinson's disease, HTN, HLD, depression, DJD, diabetic neuropathy, prostate CA, DM2, gout, dyspnea, tremor    PT Comments    Pt seen for PT tx with pt agreeable to tx. PT reinforced need for pt to use RW at home as well as need to ensure clear pathways with pt's wife reporting she is working on [employer] the house. On this date, pt is able to ambulate 2 laps around nurses station with RW & supervision<>CGA with improving gait pattern. Anticipate pt can return home with OPPT f/u (per pt & wife's request).    Recommendations for follow up therapy are one component of a multi-disciplinary discharge planning process, led by the attending physician.  Recommendations may be updated based on patient status, additional functional criteria and insurance authorization.  Follow Up Recommendations  Outpatient PT     Assistance Recommended at Discharge Frequent or constant Supervision/Assistance  Patient can return home with the following A little help with bathing/dressing/bathroom;A little help with walking and/or transfers;Assistance with cooking/housework;Assist for transportation;Direct supervision/assist for medications management   Equipment Recommendations  Rolling walker (2 wheels)    Recommendations for Other Services       Precautions / Restrictions Precautions Precautions: Fall Restrictions Weight Bearing Restrictions: No     Mobility  Bed Mobility               General bed mobility comments: not tested, pt received & left sitting in recliner    Transfers Overall transfer level: Needs assistance Equipment used:  Rolling walker (2 wheels) Transfers: Sit to/from Stand Sit to Stand: Supervision                Ambulation/Gait Ambulation/Gait assistance: Min guard, Supervision Gait Distance (Feet): 300 Feet Assistive device: Rolling walker (2 wheels) Gait Pattern/deviations: Decreased step length - right, Decreased step length - left       General Gait Details: Improved foot clearance compared to yesterday, improving gait speed. Pt requires supervision initially but then CGA to keep brief from falling down.   Stairs             Wheelchair Mobility    Modified Rankin (Stroke Patients Only)       Balance Overall balance assessment: Needs assistance, History of Falls Sitting-balance support: Feet supported Sitting balance-Leahy Scale: Fair     Standing balance support: During functional activity, Bilateral upper extremity supported Standing balance-Leahy Scale: Fair                              Cognition Arousal/Alertness: Awake/alert Behavior During Therapy: WFL for tasks assessed/performed Overall Cognitive Status: Within Functional Limits for tasks assessed Area of Impairment: Memory                       Following Commands: Follows one step commands consistently, Follows one step commands with increased time       General Comments: Pt recalls working with PT yesterday, but doesn't recall working with this PT.        Exercises      General Comments  Pertinent Vitals/Pain Pain Assessment Pain Assessment: No/denies pain    Home Living Family/patient expects to be discharged to:: Private residence Living Arrangements: Spouse/significant other Available Help at Discharge: Family;Available 24 hours/day Type of Home: House (condo) Home Access: Level entry       Home Layout: One level Home Equipment: Standard Walker;Cane - quad      Prior Function            PT Goals (current goals can now be found in the care plan  section) Acute Rehab PT Goals Patient Stated Goal: get better, go home PT Goal Formulation: With patient/family Time For Goal Achievement: 12/23/21 Potential to Achieve Goals: Good Progress towards PT goals: Progressing toward goals    Frequency    Min 2X/week      PT Plan Current plan remains appropriate    Co-evaluation              AM-PAC PT "6 Clicks" Mobility   Outcome Measure  Help needed turning from your back to your side while in a flat bed without using bedrails?: None Help needed moving from lying on your back to sitting on the side of a flat bed without using bedrails?: None Help needed moving to and from a bed to a chair (including a wheelchair)?: A Little Help needed standing up from a chair using your arms (e.g., wheelchair or bedside chair)?: A Little Help needed to walk in hospital room?: A Little Help needed climbing 3-5 steps with a railing? : A Little 6 Click Score: 20    End of Session   Activity Tolerance: Patient tolerated treatment well Patient left: in chair;with chair alarm set;with call bell/phone within reach;with family/visitor present Nurse Communication: Mobility status PT Visit Diagnosis: Unsteadiness on feet (R26.81);Muscle weakness (generalized) (M62.81);History of falling (Z91.81)     Time: 0277-4128 PT Time Calculation (min) (ACUTE ONLY): 16 min  Charges:  $Therapeutic Activity: 8-22 mins                     Lavone Nian, PT, DPT 12/10/21, 3:54 PM   Waunita Schooner 12/10/2021, 3:52 PM

## 2021-12-11 DIAGNOSIS — D696 Thrombocytopenia, unspecified: Secondary | ICD-10-CM | POA: Diagnosis not present

## 2021-12-11 DIAGNOSIS — G9341 Metabolic encephalopathy: Secondary | ICD-10-CM | POA: Diagnosis not present

## 2021-12-11 DIAGNOSIS — R509 Fever, unspecified: Secondary | ICD-10-CM | POA: Diagnosis not present

## 2021-12-11 LAB — CBC
HCT: 32.1 % — ABNORMAL LOW (ref 39.0–52.0)
Hemoglobin: 10.6 g/dL — ABNORMAL LOW (ref 13.0–17.0)
MCH: 32.3 pg (ref 26.0–34.0)
MCHC: 33 g/dL (ref 30.0–36.0)
MCV: 97.9 fL (ref 80.0–100.0)
Platelets: 78 10*3/uL — ABNORMAL LOW (ref 150–400)
RBC: 3.28 MIL/uL — ABNORMAL LOW (ref 4.22–5.81)
RDW: 12.4 % (ref 11.5–15.5)
WBC: 5.1 10*3/uL (ref 4.0–10.5)
nRBC: 0 % (ref 0.0–0.2)

## 2021-12-11 LAB — GLUCOSE, CAPILLARY: Glucose-Capillary: 168 mg/dL — ABNORMAL HIGH (ref 70–99)

## 2021-12-11 MED ORDER — AMOXICILLIN-POT CLAVULANATE 875-125 MG PO TABS: 1.0000 | ORAL_TABLET | Freq: Two times a day (BID) | ORAL | 0 refills | Status: AC

## 2021-12-11 NOTE — Discharge Summary (Signed)
Physician Discharge Summary   Patient: Craig Wilson MRN: [id number] DOB: [birth year]  Admit date:     12/08/2021  Discharge date: 12/11/2021  Discharge Physician: Jennye Boroughs   PCP: Adin Hector, MD   Recommendations at discharge:   Follow-up with PCP in 1 week  Discharge Diagnoses: Principal Problem:   Acute metabolic encephalopathy Active Problems:   Parkinson's disease ([place])   Bipolar disorder ([place])   Thrombocytopenia ([place])   Fever  Resolved Problems:   * No resolved hospital problems. *  Hospital Course:  Craig Wilson is a 81 y.o. male with medical history significant for bipolar disorder, Parkinson's disease, hypertension, hyperlipidemia, frequent falls, unsteady gait, chronic diarrhea, tremor, anemia, thrombocytopenia, degenerative joint disease, gout, history of prostate cancer, sleep apnea.  He was brought to the hospital because of fever and altered mental status.   Etiology of his symptoms was not clear.  He was treated with IV fluids and empiric IV antibiotics.  Fever and acute metabolic encephalopathy have resolved.  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  There was no growth on blood cultures.  His condition has improved and he is deemed stable for discharge to home today.           Consultants: None Procedures performed: None Disposition: Home Diet recommendation:  Discharge Diet Orders (From admission, onward)     Start     Ordered   12/11/21 0000  Diet - low sodium heart healthy        12/11/21 0922           Cardiac diet DISCHARGE MEDICATION: Allergies as of 12/11/2021       Reactions   Carbidopa W-levodopa Other (See Comments)   irrational behavior , felt zombie like   Carbidopa-levodopa Other (See Comments)   irrational behavior , felt zombie like   Latex Other (See Comments)   Internal such as catheters. Latex gloves are okay   Solifenacin Other (See Comments)        Medication List     TAKE these  medications    amoxicillin-clavulanate 875-125 MG tablet Commonly known as: AUGMENTIN Take 1 tablet by mouth 2 (two) times daily for 2 days. Start taking on: December 12, 2021   aspirin EC 81 MG tablet Take 81 mg by mouth daily. Swallow whole.   atorvastatin 40 MG tablet Commonly known as: LIPITOR Take 1 tablet (40 mg total) by mouth every evening.   carbidopa-levodopa 25-100 MG tablet Commonly known as: SINEMET IR Take 1 tablet by mouth 4 (four) times daily.   lithium carbonate 300 MG capsule Take 300 mg by mouth 2 (two) times daily.   loperamide 2 MG capsule Commonly known as: IMODIUM Take 1 capsule (2 mg total) by mouth as needed for diarrhea or loose stools.   nystatin cream Commonly known as: MYCOSTATIN Apply 1 Application topically 2 (two) times daily as needed.   traZODone 50 MG tablet Commonly known as: DESYREL Take 50-100 mg by mouth at bedtime.               Durable Medical Equipment  (From admission, onward)           Start     Ordered   12/09/21 1406  For home use only DME Walker rolling  Once       Question Answer Comment  Walker: With 5 Inch Wheels   Patient needs a walker to treat with the following condition General weakness      12/09/21  1405            Discharge Exam: Filed Weights   12/08/21 2002  Weight: 79 kg   GEN: NAD SKIN: Warm and dry EYES: No pallor or icterus ENT: MMM CV: RRR PULM: CTA B ABD: soft, ND, NT, +BS CNS: AAO x 3, non focal EXT: No edema or tenderness   Condition at discharge: good  The results of significant diagnostics from this hospitalization (including imaging, microbiology, ancillary and laboratory) are listed below for reference.   Imaging Studies: DG HIP UNILAT WITH PELVIS 2-3 VIEWS LEFT  Result Date: 12/09/2021 CLINICAL DATA:  Persistent left hip pain, initial encounter EXAM: DG HIP (WITH OR WITHOUT PELVIS) 3V LEFT COMPARISON:  06/24/2021 FINDINGS: Pelvic ring is intact. Postsurgical changes  are noted consistent with prior prostatectomy. 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 No acute fracture or dislocation is noted. No soft tissue abnormality is seen. IMPRESSION: No acute fracture noted. Electronically Signed   By: [name] M.D.   On: 12/09/2021 00:44   CT HEAD WO CONTRAST (5MM)  Result Date: 12/08/2021 CLINICAL DATA:  Mental status change, unknown cause EXAM: CT HEAD WITHOUT CONTRAST TECHNIQUE: Contiguous axial images were obtained from the base of the skull through the vertex without intravenous contrast. RADIATION DOSE REDUCTION: This exam was performed according to the departmental dose-optimization program which includes automated exposure control, adjustment of the mA and/or kV according to patient size and/or use of iterative reconstruction technique. COMPARISON:  05/06/2021 FINDINGS: Brain: No acute intracranial abnormality. Specifically, no hemorrhage, hydrocephalus, mass lesion, acute infarction, or significant intracranial injury. Vascular: No hyperdense vessel or unexpected calcification. Skull: No acute calvarial abnormality. Sinuses/Orbits: No acute findings Other: None IMPRESSION: No acute intracranial abnormality. Electronically Signed   By: Rolm Baptise M.D.   On: 12/08/2021 22:04   DG Chest Port 1 View  Result Date: 12/08/2021 CLINICAL DATA:  Suspected sepsis, weakness, fever, shortness of breath EXAM: PORTABLE CHEST 1 VIEW COMPARISON:  05/06/2021 FINDINGS: The heart size and mediastinal contours are within normal limits. Both lungs are clear. The visualized skeletal structures are unremarkable. IMPRESSION: No active disease. Electronically Signed   By: Rolm Baptise M.D.   On: 12/08/2021 20:35    Microbiology: Results for orders placed or performed during the hospital encounter of 12/08/21  Culture, blood (Routine x 2)     Status: None (Preliminary result)   Collection Time: 12/08/21  8:36 PM   Specimen: BLOOD  Result Value Ref Range  Status   Specimen Description BLOOD BLOOD LEFT FOREARM  Final   Special Requests   Final    BOTTLES DRAWN AEROBIC AND ANAEROBIC Blood Culture results may not be optimal due to an inadequate volume of blood received in culture bottles   Culture   Final    NO GROWTH 2 DAYS Performed at [facility], [address] [place], [place] [address]    Report Status PENDING  Incomplete  Urine Culture     Status: Abnormal   Collection Time: 12/08/21  8:59 PM   Specimen: Urine, Clean Catch  Result Value Ref Range Status   Specimen Description   Final    URINE, CLEAN CATCH Performed at [facility], [address], [place], [place] [address]    Special Requests   Final    NONE Performed at [facility], [address], [place], [place] [address]    Culture MULTIPLE SPECIES PRESENT, SUGGEST RECOLLECTION (A)  Final   Report  Status 12/10/2021 FINAL  Final  Culture, blood (Routine x 2)     Status: None (Preliminary result)   Collection Time: 12/08/21  9:22 PM   Specimen: BLOOD  Result Value Ref Range Status   Specimen Description BLOOD BLOOD RIGHT WRIST  Final   Special Requests   Final    BOTTLES DRAWN AEROBIC AND ANAEROBIC Blood Culture adequate volume   Culture   Final    NO GROWTH 2 DAYS Performed at [facility], [address] [place], [place] [address]    Report Status PENDING  Incomplete  Respiratory (~20 pathogens) panel by PCR     Status: None   Collection Time: 12/09/21  1:02 AM   Specimen: Nasopharyngeal Swab; Respiratory  Result Value Ref Range Status   Adenovirus NOT DETECTED NOT DETECTED Final   Coronavirus 229E NOT DETECTED NOT DETECTED Final    Comment: (NOTE) The Coronavirus on the Respiratory Panel, DOES NOT test for the novel  Coronavirus (2019 nCoV)    Coronavirus HKU1 NOT DETECTED NOT DETECTED Final   Coronavirus NL63 NOT DETECTED NOT DETECTED Final   Coronavirus OC43 NOT DETECTED NOT DETECTED Final   Metapneumovirus NOT  DETECTED NOT DETECTED Final   Rhinovirus / Enterovirus NOT DETECTED NOT DETECTED Final   Influenza A NOT DETECTED NOT DETECTED Final   Influenza B NOT DETECTED NOT DETECTED Final   Parainfluenza Virus 1 NOT DETECTED NOT DETECTED Final   Parainfluenza Virus 2 NOT DETECTED NOT DETECTED Final   Parainfluenza Virus 3 NOT DETECTED NOT DETECTED Final   Parainfluenza Virus 4 NOT DETECTED NOT DETECTED Final   Respiratory Syncytial Virus NOT DETECTED NOT DETECTED Final   Bordetella pertussis NOT DETECTED NOT DETECTED Final   Bordetella Parapertussis NOT DETECTED NOT DETECTED Final   Chlamydophila pneumoniae NOT DETECTED NOT DETECTED Final   Mycoplasma pneumoniae NOT DETECTED NOT DETECTED Final    Comment: Performed at [facility], [place] [address], [place], [place] [address]  SARS Coronavirus 2 by RT PCR (hospital order, performed in [facility] hospital lab) *cepheid single result test* Anterior Nasal Swab     Status: None   Collection Time: 12/09/21  1:02 AM   Specimen: Anterior Nasal Swab  Result Value Ref Range Status   SARS Coronavirus 2 by RT PCR NEGATIVE NEGATIVE Final    Comment: (NOTE) SARS-CoV-2 target nucleic acids are NOT DETECTED.  The SARS-CoV-2 RNA is generally detectable in upper and lower respiratory specimens during the acute phase of infection. The lowest concentration of SARS-CoV-2 viral copies this assay can detect is 250 copies / mL. A negative result does not preclude SARS-CoV-2 infection and should not be used as the sole basis for treatment or other patient management decisions.  A negative result may occur with improper specimen collection / handling, submission of specimen other than nasopharyngeal swab, presence of viral mutation(s) within the areas targeted by this assay, and inadequate number of viral copies (<250 copies / mL). A negative result must be combined with clinical observations, patient history, and epidemiological information.  Fact Sheet for  Patients:   https://www.patel.info/  Fact Sheet for Healthcare Providers: https://hall.com/  This test is not yet approved or  cleared by the [place] FDA and has been authorized for detection and/or diagnosis of SARS-CoV-2 by FDA under an Emergency Use Authorization (EUA).  This EUA will remain in effect (meaning this test can be used) for the duration of the COVID-19 declaration under Section 564(b)(1) of the Act, 21 U.S.C. section  360bbb-3(b)(1), unless the authorization is terminated or revoked sooner.  Performed at [facility], [address] [place], [place] [address]   MRSA Next Gen by PCR, Nasal     Status: None   Collection Time: 12/09/21  5:55 PM   Specimen: Nasal Mucosa; Nasal Swab  Result Value Ref Range Status   MRSA by PCR Next Gen NOT DETECTED NOT DETECTED Final    Comment: (NOTE) The GeneXpert MRSA Assay (FDA approved for NASAL specimens only), is one component of a comprehensive MRSA colonization surveillance program. It is not intended to diagnose MRSA infection nor to guide or monitor treatment for MRSA infections. Test performance is not FDA approved in patients less than 51 years old. Performed at [facility], [address] [place], [place] [address]     Labs: CBC: Recent Labs  Lab 12/08/21 2036 12/09/21 0515 12/10/21 0452 12/11/21 0500  WBC 7.8 4.9 4.4 5.1  NEUTROABS 7.2  --   --   --   HGB 11.6* 11.5* 10.8* 10.6*  HCT 36.0* 35.7* 33.0* 32.1*  MCV 100.3* 102.0* 100.0 97.9  PLT 107* 88* 78* 78*   Basic Metabolic Panel: Recent Labs  Lab 12/08/21 2036 12/09/21 0515 12/10/21 0452  NA 138 140 142  K 4.6 4.3 4.6  CL 107 111 112*  CO2 '24 23 23  '$ GLUCOSE 131* 164* 139*  BUN '21 19 16  '$ CREATININE 0.97 0.94 0.96  CALCIUM 9.9 9.6 9.8   Liver Function Tests: Recent Labs  Lab 12/08/21 2036 12/09/21 0515  AST 13* 14*  ALT 17 6  ALKPHOS 81 83  BILITOT 0.8 0.7  PROT 7.0  7.0  ALBUMIN 4.4 4.2   CBG: Recent Labs  Lab 12/10/21 0758 12/11/21 0815  GLUCAP 133* 168*    Discharge time spent: greater than 30 minutes.  Signed: Jennye Boroughs, MD [facility] 12/11/2021

## 2021-12-11 NOTE — Care Management Important Message (Signed)
Important Message  Patient Details  Name: Craig Wilson MRN: [id number] Date of Birth: 1941-04-16   Medicare Important Message Given:  N/A - LOS <3 / Initial given by admissions     Craig Wilson 12/11/2021, 8:13 AM

## 2021-12-11 NOTE — Progress Notes (Signed)
Pt A/Ox4 upon review of AVS. PIV removed. Assisted patient with getting dressed. Taxi voucher given to patient due to inability to get ride home. Patient states he has a walker being delivered to the house. No further needs.

## 2021-12-13 LAB — CULTURE, BLOOD (ROUTINE X 2)
Culture: NO GROWTH
Culture: NO GROWTH
Special Requests: ADEQUATE

## 2022-02-18 NOTE — Progress Notes (Unsigned)
Cardiology Office Note  Date:  02/19/2022   ID:  Craig, Wilson 10-30-1940, MRN [id number]  PCP:  Adin Hector, MD   Chief Complaint  Patient presents with   12 month follow up     Patient c/o shortness of breath daily. Medications reviewed by the patient verbally.     HPI:  Mr. Craig Wilson is a very pleasant 81 year old gentleman with history of  Parkinson's,  hypertension,  diabetes,  prostate cancer with prior radiation treatment,  hyperlipidemia  CT coronary calcium score score of 7 who presents for evaluation of shortness of breath, coronary artery disease, hypertension.  Last seen in the clinic by myself July 2022 Several hospital admissions since that time  Recent hospital admission for acute metabolic encephalopathy July 2023 Presenting with fever, altered mental status, treated with IV fluids and antibiotics Chronic thrombocytopenia In the hospital 3 days  In the hospital January 2023, closed nondisplaced fracture right distal fibula as well as right second and third metatarsals Frequent falls at home.  Was discharged home, readmitted 10 days later after repeat falls Was sent to rehab for recovery, [facility] Lives in wheelchair for one month  In follow-up today feels that his strength is improving Now with issues of incontinence, previously placed device not working Reports on catheterization in the emergency room, trauma to device now worsening incontinence  Continues to have worsening shortness of breath No prior echocardiogram available Lab work reviewed A1C 6.6 Total chol 139, LDL 48  EKG personally reviewed by myself on todays visit Sinus bradycardia rate 56 bpm right bundle branch block no significant ST-T wave changes  Other past medical history reviewed In hospital,  may and June 2022:  Septic shock secondary to pneumonia Acute hypoxic and hypercapnic respiratory failure due to pneumonia sepsis Acute metabolic encephalopathy due to  sepsis Patient presented with tachypnea, somnolence, hypoxic respiratory failure in the setting of pneumonia with septic shock.   admitted to ICU, intubated, placed on Levophed and antibiotics.   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   sphincter had been activated 5/25, deactivated 5/31 due to AKI and urinary obstruction.  During this hospitalization, the patient had a Foley catheter for several days  Creatinine resolved to normal, new AKI ruled out.   PNA, Spent 3 weeks at rehab   At the time of discharge ,off losartan after weight loss in hospital  Acute on Chronic SOB, Seen by pulmonary, on Symbicort  Previous CT coronary calcium  score of 7  Denies any leg swelling Prior history of chronic diarrhea   strong family history. Father had MI in his 67s, mother had stroke in her 56s Brother has cancer    PMH:   has a past medical history of Anemia, Bipolar disorder ([name]), Chronic diarrhea, Colon polyps, Depression, Dermatophytosis, Diabetic neuropathy ([place]), DJD (degenerative joint disease), Dyspnea, Gout, Gross hematuria, HLD (hyperlipidemia), HTN (hypertension), Hyperkeratosis, Onychomycosis, Parkinson disease ([name]), Prostate cancer ([place]), Sleep apnea, Tremor, and Type 2 diabetes mellitus ([place]).  PSH:    Past Surgical History:  Procedure Laterality Date   cataract surgery Bilateral    COLONOSCOPY W/ BIOPSIES     FOOT SURGERY     PROSTATE SURGERY     removed   status post implantation of artificial urinary sphincter  TONSILLECTOMY      Current Outpatient Medications  Medication Sig Dispense Refill   aspirin EC 81 MG tablet Take 81 mg by mouth daily. Swallow whole.     atorvastatin (LIPITOR) 40 MG tablet Take 1 tablet (40  mg total) by mouth every evening. 90 tablet 3   carbidopa-levodopa (SINEMET IR) 25-100 MG tablet Take 1 tablet by mouth 4 (four) times daily.     lithium carbonate 300 MG capsule Take 300 mg by mouth 2 (two) times daily.     loperamide (IMODIUM) 2 MG capsule Take 1 capsule (2 mg total) by mouth as needed for diarrhea or loose stools. 30 capsule 0   nystatin cream (MYCOSTATIN) Apply 1 Application topically 2 (two) times daily as needed.     rOPINIRole (REQUIP) 2 MG tablet Take 2 mg by mouth at bedtime.     traZODone (DESYREL) 50 MG tablet Take 50-100 mg by mouth at bedtime.     No current facility-administered medications for this visit.    Allergies:   Carbidopa w-levodopa, Carbidopa-levodopa, Latex, and Solifenacin   Social History:  The patient  reports that he quit smoking about 22 years ago. His smoking use included cigarettes. He has never used smokeless tobacco. He reports that he does not drink alcohol and does not use drugs.   Family History:   family history includes Heart attack (age of onset: 49) in his father; Heart disease in his mother; Hyperlipidemia in his father; Hypertension in his father.    Review of Systems: Review of Systems  Constitutional: Negative.   HENT: Negative.    Respiratory:  Positive for shortness of breath.   Cardiovascular: Negative.   Gastrointestinal: Negative.   Musculoskeletal:  Positive for falls.       Unsteady gait  Neurological: Negative.   Psychiatric/Behavioral: Negative.    All other systems reviewed and are negative.    PHYSICAL EXAM: VS:  BP (!) 124/50 (BP Location: Left Arm, Patient Position: Sitting, Cuff Size: Normal)   Pulse (!) 56   Ht '5\' 7"'$  (1.702 m)   Wt 164 lb 2 oz (74.4 kg)   SpO2 98%   BMI 25.71 kg/m  , BMI Body mass index is 25.71 kg/m. Constitutional:  oriented to person, place, and time. No distress.  HENT:  Head: Grossly normal Eyes:  no discharge. No scleral icterus.  Neck: No JVD, no carotid bruits   Cardiovascular: Regular rate and rhythm, no murmurs appreciated Pulmonary/Chest: Clear to auscultation bilaterally, no wheezes or rails Abdominal: Soft.  no distension.  no tenderness.  Musculoskeletal: Normal range of motion Neurological:  normal muscle tone. Coordination normal. No atrophy Skin: Skin warm and dry Psychiatric: normal affect, pleasant  Recent Labs: 06/26/2021: Magnesium 2.4 12/09/2021: ALT 6; TSH 1.044 12/10/2021: BUN 16; Creatinine, Ser 0.96; Potassium 4.6; Sodium 142 12/11/2021: Hemoglobin 10.6; Platelets 78    Lipid Panel Lab Results  Component Value Date   TRIG 104 11/06/2020      Wt Readings from Last 3 Encounters:  02/19/22 164 lb 2 oz (74.4 kg)  12/08/21 174 lb 2.6 oz (79 kg)  06/24/21 174 lb 2.6 oz (79 kg)     ASSESSMENT AND PLAN:  Essential hypertension  Blood pressure is well controlled on today's visit. No changes made to the medications.  Mixed hyperlipidemia Minimal underlying coronary calcifications seen on CT scan Cholesterol is at goal on the current lipid regimen. No changes to the medications were made.  SOB (shortness of breath) Chronic shortness of breath,  sedentary, likely exacerbated by Parkinson's Echocardiogram ordered, no prior baseline study  Parkinson's disease ([place]) Recommended walking program Followed by neurology High fall risk  Type 2 diabetes mellitus with complication, without long-term current use of insulin (HCC) Hemoglobin A1c stable A1c above 6, has been trending upwards  Anemia, unspecified type Stable, HGB 10.4   Total encounter time more than 30 minutes  Greater than 50% was spent in counseling and coordination of care with the patient    Orders Placed This Encounter  Procedures   EKG 12-Lead      Signed, Esmond Plants, M.D., Ph.D. 02/19/2022  [place], [place]

## 2022-02-19 ENCOUNTER — Encounter: Payer: Self-pay | Admitting: Cardiovascular Disease

## 2022-02-19 ENCOUNTER — Ambulatory Visit: Payer: Medicare Other | Attending: Cardiovascular Disease | Admitting: Cardiovascular Disease

## 2022-02-19 VITALS — BP 124/50 | HR 56 | Ht 67.0 in | Wt 164.1 lb

## 2022-02-19 DIAGNOSIS — E118 Type 2 diabetes mellitus with unspecified complications: Secondary | ICD-10-CM

## 2022-02-19 DIAGNOSIS — E782 Mixed hyperlipidemia: Secondary | ICD-10-CM | POA: Diagnosis not present

## 2022-02-19 DIAGNOSIS — G20 Parkinson's disease: Secondary | ICD-10-CM | POA: Diagnosis not present

## 2022-02-19 DIAGNOSIS — R931 Abnormal findings on diagnostic imaging of heart and coronary circulation: Secondary | ICD-10-CM | POA: Diagnosis not present

## 2022-02-19 DIAGNOSIS — I10 Essential (primary) hypertension: Secondary | ICD-10-CM

## 2022-02-19 DIAGNOSIS — R0602 Shortness of breath: Secondary | ICD-10-CM

## 2022-02-19 MED ORDER — ATORVASTATIN CALCIUM 40 MG PO TABS: 40.0000 mg | ORAL_TABLET | Freq: Every evening | ORAL | 3 refills | Status: DC

## 2022-02-19 NOTE — Patient Instructions (Addendum)
Medication Instructions:  No changes  If you need a refill on your cardiac medications before your next appointment, please call your pharmacy.    Lab work: No new labs needed   Testing/Procedures: 1) Echocardiogram: (for shortness of breath)  - Your physician has requested that you have an echocardiogram. Echocardiography is a painless test that uses sound waves to create images of your heart. It provides your doctor with information about the size and shape of your heart and how well your heart's chambers and valves are working. This procedure takes approximately one hour. There are no restrictions for this procedure. There is a possibility that an IV may need to be started during your test to inject an image enhancing agent. This is done to obtain more optimal pictures of your heart. Therefore we ask that you do at least drink some water prior to coming in to hydrate your veins.     Follow-Up: At [facility], you and your health needs are our priority.  As part of our continuing mission to provide you with exceptional heart care, we have created designated Provider Care Teams.  These Care Teams include your primary Cardiologist (physician) and Advanced Practice Providers (APPs -  Physician Assistants and Nurse Practitioners) who all work together to provide you with the care you need, when you need it.  You will need a follow up appointment in 6 months  Providers on your designated Care Team:   Murray Hodgkins, NP Christell Faith, PA-C Cadence Kathlen Mody, [place]  COVID-19 Vaccine Information can be found at: [email or web address].uk For questions related to vaccine distribution or appointments, please email [email or web address] or call 8542783006.    Echocardiogram An echocardiogram is a test that uses sound waves (ultrasound) to produce images of the heart. Images from an echocardiogram can provide important information about: Heart  size and shape. The size and thickness and movement of your heart's walls. Heart muscle function and strength. Heart valve function or if you have stenosis. Stenosis is when the heart valves are too narrow. If blood is flowing backward through the heart valves (regurgitation). A tumor or infectious growth around the heart valves. Areas of heart muscle that are not working well because of poor blood flow or injury from a heart attack. Aneurysm detection. An aneurysm is a weak or damaged part of an artery wall. The wall bulges out from the normal force of blood pumping through the body. Tell a health care provider about: Any allergies you have. All medicines you are taking, including vitamins, herbs, eye drops, creams, and over-the-counter medicines. Any blood disorders you have. Any surgeries you have had. Any medical conditions you have. Whether you are pregnant or may be pregnant. What are the risks? Generally, this is a safe test. However, problems may occur, including an allergic reaction to dye (contrast) that may be used during the test. What happens before the test? No specific preparation is needed. You may eat and drink normally. What happens during the test?  You will take off your clothes from the waist up and put on a hospital gown. Electrodes or electrocardiogram (ECG)patches may be placed on your chest. The electrodes or patches are then connected to a device that monitors your heart rate and rhythm. You will lie down on a table for an ultrasound exam. A gel will be applied to your chest to help sound waves pass through your skin. A handheld device, called a transducer, will be pressed against your chest and moved over your heart. The transducer  produces sound waves that travel to your heart and bounce back (or "echo" back) to the transducer. These sound waves will be captured in real-time and changed into images of your heart that can be viewed on a video monitor. The images  will be recorded on a computer and reviewed by your health care provider. You may be asked to change positions or hold your breath for a short time. This makes it easier to get different views or better views of your heart. In some cases, you may receive contrast through an IV in one of your veins. This can improve the quality of the pictures from your heart. The procedure may vary among health care providers and hospitals. What can I expect after the test? You may return to your normal, everyday life, including diet, activities, and medicines, unless your health care provider tells you not to do that. Follow these instructions at home: It is up to you to get the results of your test. Ask your health care provider, or the department that is doing the test, when your results will be ready. Keep all follow-up visits. This is important. Summary An echocardiogram is a test that uses sound waves (ultrasound) to produce images of the heart. Images from an echocardiogram can provide important information about the size and shape of your heart, heart muscle function, heart valve function, and other possible heart problems. You do not need to do anything to prepare before this test. You may eat and drink normally. After the echocardiogram is completed, you may return to your normal, everyday life, unless your health care provider tells you not to do that. This information is not intended to replace advice given to you by your health care provider. Make sure you discuss any questions you have with your health care provider. Document Revised: 01/31/2021 Document Reviewed: 01/11/2020 Elsevier Patient Education  [place].

## 2022-02-19 NOTE — Addendum Note (Signed)
Addended by: Alvis Lemmings C on: 02/19/2022 03:32 PM   Modules accepted: Orders

## 2022-03-01 ENCOUNTER — Ambulatory Visit: Payer: Medicare Other | Attending: Cardiovascular Disease

## 2022-03-04 ENCOUNTER — Emergency Department: Payer: Medicare Other

## 2022-03-04 ENCOUNTER — Encounter: Payer: Self-pay | Admitting: Emergency Medicine

## 2022-03-04 ENCOUNTER — Inpatient Hospital Stay
Admission: EM | Admit: 2022-03-04 | Discharge: 2022-03-06 | DRG: 178 | Disposition: A | Discharge status: Home Health | Payer: Medicare Other | Attending: Internal Medicine | Admitting: Internal Medicine

## 2022-03-04 ENCOUNTER — Other Ambulatory Visit: Payer: Self-pay

## 2022-03-04 DIAGNOSIS — Z8349 Family history of other endocrine, nutritional and metabolic diseases: Secondary | ICD-10-CM

## 2022-03-04 DIAGNOSIS — E1140 Type 2 diabetes mellitus with diabetic neuropathy, unspecified: Secondary | ICD-10-CM | POA: Diagnosis present

## 2022-03-04 DIAGNOSIS — F319 Bipolar disorder, unspecified: Secondary | ICD-10-CM | POA: Diagnosis present

## 2022-03-04 DIAGNOSIS — R296 Repeated falls: Secondary | ICD-10-CM | POA: Diagnosis present

## 2022-03-04 DIAGNOSIS — Z8249 Family history of ischemic heart disease and other diseases of the circulatory system: Secondary | ICD-10-CM | POA: Diagnosis not present

## 2022-03-04 DIAGNOSIS — E872 Acidosis, unspecified: Secondary | ICD-10-CM | POA: Diagnosis present

## 2022-03-04 DIAGNOSIS — Z87891 Personal history of nicotine dependence: Secondary | ICD-10-CM | POA: Diagnosis not present

## 2022-03-04 DIAGNOSIS — E119 Type 2 diabetes mellitus without complications: Secondary | ICD-10-CM

## 2022-03-04 DIAGNOSIS — R531 Weakness: Secondary | ICD-10-CM | POA: Diagnosis present

## 2022-03-04 DIAGNOSIS — I10 Essential (primary) hypertension: Secondary | ICD-10-CM | POA: Diagnosis present

## 2022-03-04 DIAGNOSIS — D649 Anemia, unspecified: Secondary | ICD-10-CM | POA: Diagnosis present

## 2022-03-04 DIAGNOSIS — G20A1 Parkinson's disease without dyskinesia, without mention of fluctuations: Secondary | ICD-10-CM | POA: Diagnosis present

## 2022-03-04 DIAGNOSIS — U071 COVID-19: Principal | ICD-10-CM | POA: Diagnosis present

## 2022-03-04 DIAGNOSIS — J449 Chronic obstructive pulmonary disease, unspecified: Secondary | ICD-10-CM | POA: Diagnosis present

## 2022-03-04 DIAGNOSIS — Z888 Allergy status to other drugs, medicaments and biological substances status: Secondary | ICD-10-CM | POA: Diagnosis not present

## 2022-03-04 DIAGNOSIS — M1990 Unspecified osteoarthritis, unspecified site: Secondary | ICD-10-CM | POA: Diagnosis present

## 2022-03-04 DIAGNOSIS — Z91040 Latex allergy status: Secondary | ICD-10-CM | POA: Diagnosis not present

## 2022-03-04 DIAGNOSIS — M109 Gout, unspecified: Secondary | ICD-10-CM | POA: Diagnosis present

## 2022-03-04 DIAGNOSIS — D696 Thrombocytopenia, unspecified: Secondary | ICD-10-CM | POA: Diagnosis present

## 2022-03-04 DIAGNOSIS — E1165 Type 2 diabetes mellitus with hyperglycemia: Secondary | ICD-10-CM | POA: Diagnosis present

## 2022-03-04 DIAGNOSIS — Z8546 Personal history of malignant neoplasm of prostate: Secondary | ICD-10-CM

## 2022-03-04 DIAGNOSIS — G4733 Obstructive sleep apnea (adult) (pediatric): Secondary | ICD-10-CM | POA: Diagnosis present

## 2022-03-04 DIAGNOSIS — E785 Hyperlipidemia, unspecified: Secondary | ICD-10-CM | POA: Diagnosis present

## 2022-03-04 DIAGNOSIS — Z79899 Other long term (current) drug therapy: Secondary | ICD-10-CM | POA: Diagnosis not present

## 2022-03-04 DIAGNOSIS — Z7982 Long term (current) use of aspirin: Secondary | ICD-10-CM

## 2022-03-04 LAB — URINALYSIS, ROUTINE W REFLEX MICROSCOPIC
Bacteria, UA: NONE SEEN
Bilirubin Urine: NEGATIVE
Glucose, UA: NEGATIVE mg/dL
Ketones, ur: NEGATIVE mg/dL
Leukocytes,Ua: NEGATIVE
Nitrite: NEGATIVE
Protein, ur: NEGATIVE mg/dL
Specific Gravity, Urine: 1.008 (ref 1.005–1.030)
Squamous Epithelial / HPF: NONE SEEN (ref 0–5)
pH: 7 (ref 5.0–8.0)

## 2022-03-04 LAB — HEPATIC FUNCTION PANEL
ALT: 18 U/L (ref 0–44)
AST: 11 U/L — ABNORMAL LOW (ref 15–41)
Albumin: 4.3 g/dL (ref 3.5–5.0)
Alkaline Phosphatase: 72 U/L (ref 38–126)
Bilirubin, Direct: 0.2 mg/dL (ref 0.0–0.2)
Indirect Bilirubin: 0.8 mg/dL (ref 0.3–0.9)
Total Bilirubin: 1 mg/dL (ref 0.3–1.2)
Total Protein: 7.1 g/dL (ref 6.5–8.1)

## 2022-03-04 LAB — BASIC METABOLIC PANEL
Anion gap: 5 (ref 5–15)
BUN: 14 mg/dL (ref 8–23)
CO2: 29 mmol/L (ref 22–32)
Calcium: 10.1 mg/dL (ref 8.9–10.3)
Chloride: 105 mmol/L (ref 98–111)
Creatinine, Ser: 0.95 mg/dL (ref 0.61–1.24)
GFR, Estimated: >60 mL/min (ref >60)
Glucose, Bld: 162 mg/dL — ABNORMAL HIGH (ref 70–99)
Potassium: 4.6 mmol/L (ref 3.5–5.1)
Sodium: 139 mmol/L (ref 135–145)

## 2022-03-04 LAB — CBC
HCT: 35.8 % — ABNORMAL LOW (ref 39.0–52.0)
Hemoglobin: 11.7 g/dL — ABNORMAL LOW (ref 13.0–17.0)
MCH: 32.7 pg (ref 26.0–34.0)
MCHC: 32.7 g/dL (ref 30.0–36.0)
MCV: 100 fL (ref 80.0–100.0)
Platelets: 93 10*3/uL — ABNORMAL LOW (ref 150–400)
RBC: 3.58 MIL/uL — ABNORMAL LOW (ref 4.22–5.81)
RDW: 12.2 % (ref 11.5–15.5)
WBC: 8.9 10*3/uL (ref 4.0–10.5)
nRBC: 0 % (ref 0.0–0.2)

## 2022-03-04 LAB — SARS CORONAVIRUS 2 BY RT PCR: SARS Coronavirus 2 by RT PCR: POSITIVE — AB

## 2022-03-04 LAB — LACTIC ACID, PLASMA
Lactic Acid, Venous: 2.1 mmol/L (ref 0.5–1.9)
Lactic Acid, Venous: 1.5 mmol/L (ref 0.5–1.9)

## 2022-03-04 LAB — GLUCOSE, CAPILLARY: Glucose-Capillary: 314 mg/dL — ABNORMAL HIGH (ref 70–99)

## 2022-03-04 LAB — BRAIN NATRIURETIC PEPTIDE: B Natriuretic Peptide: 160.3 pg/mL — ABNORMAL HIGH (ref 0.0–100.0)

## 2022-03-04 LAB — LITHIUM LEVEL: Lithium Lvl: 0.28 mmol/L — ABNORMAL LOW (ref 0.60–1.20)

## 2022-03-04 LAB — TROPONIN I (HIGH SENSITIVITY)
Troponin I (High Sensitivity): 14 ng/L (ref <18)
Troponin I (High Sensitivity): 14 ng/L (ref <18)

## 2022-03-04 MED ORDER — ALBUTEROL SULFATE (2.5 MG/3ML) 0.083% IN NEBU: 2.5000 mg | INHALATION_SOLUTION | RESPIRATORY_TRACT | Status: DC | PRN

## 2022-03-04 MED ORDER — ASPIRIN 81 MG PO TBEC
81.0000 mg | DELAYED_RELEASE_TABLET | Freq: Every day | ORAL | Status: DC
  Administered 2022-03-05 – 2022-03-06 (×2): 81 mg via ORAL
  Filled 2022-03-04 (×2): qty 1

## 2022-03-04 MED ORDER — ATORVASTATIN CALCIUM 20 MG PO TABS
40.0000 mg | ORAL_TABLET | Freq: Every evening | ORAL | Status: DC
  Administered 2022-03-04 – 2022-03-06 (×3): 40 mg via ORAL
  Filled 2022-03-04 (×3): qty 2

## 2022-03-04 MED ORDER — CARBIDOPA-LEVODOPA 25-100 MG PO TABS
2.0000 | ORAL_TABLET | Freq: Two times a day (BID) | ORAL | Status: DC
  Administered 2022-03-04 – 2022-03-06 (×4): 2 via ORAL
  Filled 2022-03-04 (×5): qty 2

## 2022-03-04 MED ORDER — LITHIUM CARBONATE 300 MG PO CAPS
300.0000 mg | ORAL_CAPSULE | Freq: Two times a day (BID) | ORAL | Status: DC
  Administered 2022-03-04 – 2022-03-06 (×4): 300 mg via ORAL
  Filled 2022-03-04 (×6): qty 1

## 2022-03-04 MED ORDER — METHYLPREDNISOLONE SODIUM SUCC 125 MG IJ SOLR
125.0000 mg | Freq: Once | INTRAMUSCULAR | Status: AC
  Administered 2022-03-04: 125 mg via INTRAVENOUS
  Filled 2022-03-04: qty 2

## 2022-03-04 MED ORDER — ROPINIROLE HCL 1 MG PO TABS
2.0000 mg | ORAL_TABLET | Freq: Every day | ORAL | Status: DC
  Administered 2022-03-04 – 2022-03-05 (×2): 2 mg via ORAL
  Filled 2022-03-04 (×3): qty 2

## 2022-03-04 MED ORDER — TRAZODONE HCL 50 MG PO TABS
50.0000 mg | ORAL_TABLET | Freq: Every day | ORAL | Status: DC
  Administered 2022-03-05: 50 mg via ORAL
  Filled 2022-03-04 (×3): qty 1

## 2022-03-04 MED ORDER — SODIUM CHLORIDE 0.9 % IV BOLUS
1000.0000 mL | Freq: Once | INTRAVENOUS | Status: AC
  Administered 2022-03-04: 1000 mL via INTRAVENOUS

## 2022-03-04 MED ORDER — ACETAMINOPHEN 650 MG RE SUPP: 650.0000 mg | Freq: Four times a day (QID) | RECTAL | Status: DC | PRN

## 2022-03-04 MED ORDER — SODIUM CHLORIDE 0.45 % IV SOLN: INTRAVENOUS | Status: AC

## 2022-03-04 MED ORDER — IPRATROPIUM-ALBUTEROL 0.5-2.5 (3) MG/3ML IN SOLN
3.0000 mL | Freq: Once | RESPIRATORY_TRACT | Status: AC
  Administered 2022-03-04: 3 mL via RESPIRATORY_TRACT
  Filled 2022-03-04: qty 3

## 2022-03-04 MED ORDER — ACETAMINOPHEN 325 MG PO TABS: 650.0000 mg | ORAL_TABLET | Freq: Four times a day (QID) | ORAL | Status: DC | PRN

## 2022-03-04 MED ORDER — ONDANSETRON HCL 4 MG PO TABS: 4.0000 mg | ORAL_TABLET | Freq: Four times a day (QID) | ORAL | Status: DC | PRN

## 2022-03-04 MED ORDER — ONDANSETRON HCL 4 MG/2ML IJ SOLN: 4.0000 mg | Freq: Four times a day (QID) | INTRAMUSCULAR | Status: DC | PRN

## 2022-03-04 NOTE — ED Notes (Signed)
Patient drowsy but awakens to verbal stimuli. Oriented to name and place. Disoriented to time and situation. Resp even, unlabored on RA. NSR on monitor 70-80's. NS bolus infusing without signs or symptoms of infiltration.

## 2022-03-04 NOTE — H&P (Addendum)
History and Physical    Patient: Craig Wilson:[id number] DOB: [birth year] DOA: 03/04/2022 DOS: the patient was seen and examined on 03/05/2022 PCP: Adin Hector, MD  Patient coming from: Home  Chief Complaint:  Chief Complaint  Patient presents with   Weakness   Most of the history was obtained from patient's wife at the bedside HPI: Craig Wilson is a 81 y.o. male with medical history significant for bipolar disorder, depression, Parkinson's disease, diabetes mellitus, gout who was brought in to the ER via EMS for evaluation of weakness. Per patient's wife they both tested positive for the COVID-19 virus about 3 days prior to this admission.  He complains of having a sore throat, fever, cough, myalgias and generalized weakness and according to his wife he fell 1 night prior to his admission while walking back from the bathroom.  At baseline he uses a cane and he had his cane when he fell.  He denied feeling dizzy or lightheaded and thinks he may have tripped on something.  He landed on his buttocks and had pain in his low back which he rated a 5 x 10 in intensity at its worst.  Pain has completely resolved.  He was unable to get up after the fall and he had to call EMS to assist him. Wife states that he has had several falls in the last 1 week. He also complains of anorexia and poor oral intake for the last 2 to 3 days.  He denies having any nausea, no vomiting, no abdominal pain, no changes in his bowel habits, no urinary symptoms, no chest pain, no shortness of breath, no blurred vision or any focal deficit.   Review of Systems: As mentioned in the history of present illness. All other systems reviewed and are negative. Past Medical History:  Diagnosis Date   Anemia    Bipolar disorder (HCC)    Chronic diarrhea    Colon polyps    Depression    Dermatophytosis    Diabetic neuropathy (HCC)    DJD (degenerative joint disease)    Dyspnea    Gout    Gross hematuria     HLD (hyperlipidemia)    HTN (hypertension)    Hyperkeratosis    Onychomycosis    Parkinson disease    Prostate cancer ([place])    Sleep apnea    Tremor    Type 2 diabetes mellitus ([place])    Past Surgical History:  Procedure Laterality Date   cataract surgery Bilateral    COLONOSCOPY W/ BIOPSIES     FOOT SURGERY     PROSTATE SURGERY     removed   status post implantation of artificial urinary sphincter      TONSILLECTOMY     Social History:  reports that he quit smoking about 22 years ago. His smoking use included cigarettes. He has never used smokeless tobacco. He reports that he does not drink alcohol and does not use drugs.  Allergies  Allergen Reactions   Carbidopa W-Levodopa Other (See Comments)    irrational behavior , felt zombie like   Carbidopa-Levodopa Other (See Comments)    irrational behavior , felt zombie like   Latex Other (See Comments)    Internal such as catheters. Latex gloves are okay   Solifenacin Other (See Comments)    Family History  Problem Relation Age of Onset   Heart disease Mother    Heart attack Father 33   Hypertension Father    Hyperlipidemia Father  Kidney cancer Neg Hx    Prostate cancer Neg Hx    Bladder Cancer Neg Hx     Prior to Admission medications   Medication Sig Start Date End Date Taking? Authorizing Provider  aspirin EC 81 MG tablet Take 81 mg by mouth daily. Swallow whole.    [provider]  atorvastatin (LIPITOR) 40 MG tablet Take 1 tablet (40 mg total) by mouth every evening. 02/19/22   Minna Merritts, MD  carbidopa-levodopa (SINEMET IR) 25-100 MG tablet Take 1 tablet by mouth 4 (four) times daily.    [provider]  lithium carbonate 300 MG capsule Take 300 mg by mouth 2 (two) times daily.    [provider]  loperamide (IMODIUM) 2 MG capsule Take 1 capsule (2 mg total) by mouth as needed for diarrhea or loose stools. 06/28/21   Sharen Hones, MD  nystatin cream (MYCOSTATIN) Apply 1  Application topically 2 (two) times daily as needed. 11/29/21   [provider]  rOPINIRole (REQUIP) 2 MG tablet Take 2 mg by mouth at bedtime. 12/11/21   [provider]  traZODone (DESYREL) 50 MG tablet Take 50-100 mg by mouth at bedtime.    [provider]    Physical Exam: Vitals:   03/04/22 2040 03/04/22 2212 03/05/22 0521 03/05/22 0754  BP:  (!) 160/69 (!) 149/70 (!) 155/58  Pulse:  85 70 76  Resp:  '18 17 18  '$ Temp: 99.1 F (37.3 C) 98.6 F (37 C) 97.6 F (36.4 C) 97.9 F (36.6 C)  TempSrc: Oral   Oral  SpO2:  96% 94% 95%  Weight:  72.3 kg    Height:  '5\' 9"'$  (1.753 m)     Physical Exam Vitals and nursing note reviewed.  Constitutional:      Appearance: Normal appearance.  HENT:     Head: Normocephalic.     Nose: Nose normal.     Mouth/Throat:     Mouth: Mucous membranes are dry.  Eyes:     Conjunctiva/sclera: Conjunctivae normal.  Cardiovascular:     Rate and Rhythm: Normal rate and regular rhythm.  Pulmonary:     Effort: Pulmonary effort is normal.     Breath sounds: Normal breath sounds.  Abdominal:     General: Abdomen is flat. Bowel sounds are normal.     Palpations: Abdomen is soft.  Musculoskeletal:        General: Normal range of motion.     Cervical back: Normal range of motion and neck supple.  Skin:    General: Skin is warm and dry.  Neurological:     Mental Status: He is alert.     Motor: Weakness present.  Psychiatric:        Mood and Affect: Mood normal.        Behavior: Behavior normal.     Data Reviewed: Relevant notes from primary care and specialist visits, past discharge summaries as available in EHR, including Care Everywhere. Prior diagnostic testing as pertinent to current admission diagnoses Updated medications and problem lists for reconciliation ED course, including vitals, labs, imaging, treatment and response to treatment Triage notes, nursing and pharmacy notes and ED provider's notes Notable results  as noted in HPI Labs reviewed.  Total protein 7.1, albumin 4.3, AST 11, ALT 18, alkaline phosphatase 72, total bilirubin 1.0, troponin 14, lactic acid 2.1, BNP 160, sodium 139, potassium 4.6, chloride 105, bicarb 29, glucose 162, BUN 14, creatinine 0.95, calcium 10.1, white count 8.9, hemoglobin 11.7, hematocrit  35.8, platelet count 93,000 Chest x-ray reviewed by me shows no evidence of acute cardiopulmonary disease CT scan of pelvis without contrast shows no evidence of acute displaced pelvic fracture. Sigmoid diverticulosis without diverticulitis. Aortic Atherosclerosis . CT scan of the head without contrast/cervical spine CT shows No acute intracranial abnormality seen. 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 No acute abnormality seen in the cervical spine. Lumbar spine CT shows no acute fracture or traumatic malalignment of the lumbar spine. Similar degree of degenerative lumbar spondylosis most pronounced at the L4-5 level where there is right-sided neuroforaminal narrowing. Cholelithiasis. Aortic atherosclerosis  Twelve-lead EKG reviewed by me shows normal sinus rhythm, right bundle branch block  There are no new results to review at this time.  Assessment and Plan: * Generalized weakness With frequent falls Most likely related to COVID-19 viral illness Place patient on fall precautions PT evaluate and treat  COVID-19 virus infection Suspected Patient's wife tested positive about 3 days prior to this admission Patient's COVID-19 PCR is still pending Supportive care with bronchodilator therapy, multivitamins and antitussives. We will start patient on antiviral therapy if test is positive  Thrombocytopenia (HCC) Chronic Monitor closely for signs of bleeding SCD for DVT prophylaxis  Type 2 diabetes mellitus without complication, without long-term current use of insulin ([place]) Patient has diet-controlled type 2 diabetes mellitus Place patient on full liquid diet and advance  as tolerated Check blood sugars AC meals  Bipolar disorder (HCC) Continue lidocaine and trazodone  Essential hypertension Blood pressure is stable  Parkinson's disease ([place]) Stable Continue Sinemet      Advance Care Planning:   Code Status: Full Code   Consults: Physical therapy  Family Communication: Greater than 50% of time was spent discussing patient's condition and plan of care with him and his wife at the bedside.  All questions and concerns have been addressed.  They verbalized understanding and agree with the plan.  CODE STATUS was discussed and he wishes to be full code  Severity of Illness: The appropriate patient status for this patient is INPATIENT. Inpatient status is judged to be reasonable and necessary in order to provide the required intensity of service to ensure the patient's safety. The patient's presenting symptoms, physical exam findings, and initial radiographic and laboratory data in the context of their chronic comorbidities is felt to place them at high risk for further clinical deterioration. Furthermore, it is not anticipated that the patient will be medically stable for discharge from the hospital within 2 midnights of admission.   * I certify that at the point of admission it is my clinical judgment that the patient will require inpatient hospital care spanning beyond 2 midnights from the point of admission due to high intensity of service, high risk for further deterioration and high frequency of surveillance required.*  Author: Collier Bullock, MD 03/05/2022 8:15 AM  For on call review [email or web address].

## 2022-03-04 NOTE — ED Notes (Signed)
Dr Agbata at bedside. 

## 2022-03-04 NOTE — ED Triage Notes (Signed)
Arrives via [facility] for c/o weakness.  Patient seen through ED Saturday and tested positive for COVID.  Per EMS report, VS wnl.  Patient had a fall Saturday night and c/o sacral pain.

## 2022-03-04 NOTE — Assessment & Plan Note (Signed)
Stable Continue Sinemet

## 2022-03-04 NOTE — Assessment & Plan Note (Signed)
Suspected Patient's wife tested positive about 3 days prior to this admission Patient's COVID-19 PCR is still pending Supportive care with bronchodilator therapy, multivitamins and antitussives. We will start patient on antiviral therapy if test is positive

## 2022-03-04 NOTE — Assessment & Plan Note (Signed)
Blood pressure is stable 

## 2022-03-04 NOTE — ED Provider Notes (Signed)
[facility] Provider Note    Event Date/Time   First MD Initiated Contact with Patient 03/04/22 1505     (approximate)   History   Weakness   HPI  Craig Wilson is a 81 y.o. male here with generalized weakness.  Patient states that he has been sick for approximately the last week and a half.  He was seen on 9/30 and diagnosed with COVID-19.  Has history of COPD.  He was seen here and treated, states that he has had progressive worsening symptoms since then and has been weak.  He fell and struck his head has had pain in his head as well as tailbone since he was symptomatic.  Denies any chest pain.  He states he feels generally weak.  Said decreased appetite.  Said difficulty getting around the house.  He is here with his wife with his current symptoms.     Physical Exam   Triage Vital Signs: ED Triage Vitals  Enc Vitals Group     BP 03/04/22 1443 (!) 145/52     Pulse Rate 03/04/22 1443 70     Resp --      Temp 03/04/22 1443 98.3 F (36.8 C)     Temp Source 03/04/22 1443 Oral     SpO2 03/04/22 1443 99 %     Weight 03/04/22 1426 164 lb 0.4 oz (74.4 kg)     Height 03/04/22 1426 '5\' 7"'$  (1.702 m)     Head Circumference --      Peak Flow --      Pain Score 03/04/22 1426 0     Pain Loc --      Pain Edu? --      Excl. in [place]? --     Most recent vital signs: Vitals:   03/04/22 1443 03/04/22 1704  BP: (!) 145/52 (!) 156/53  Pulse: 70 74  Resp:  18  Temp: 98.3 F (36.8 C)   SpO2: 99% 96%     General: Awake, no distress.  CV:  Good peripheral perfusion.  Resp:  Normal effort. Wheezes noted bilaterally. Abd:  No distention. No tenderness. Other:  Trace edema bl.   ED Results / Procedures / Treatments   Labs (all labs ordered are listed, but only abnormal results are displayed) Labs Reviewed  BASIC METABOLIC PANEL - Abnormal; Notable for the following components:      Result Value   Glucose, Bld 162 (*)    All other components within  normal limits  CBC - Abnormal; Notable for the following components:   RBC 3.58 (*)    Hemoglobin 11.7 (*)    HCT 35.8 (*)    Platelets 93 (*)    All other components within normal limits  LACTIC ACID, PLASMA - Abnormal; Notable for the following components:   Lactic Acid, Venous 2.1 (*)    All other components within normal limits  BRAIN NATRIURETIC PEPTIDE - Abnormal; Notable for the following components:   B Natriuretic Peptide 160.3 (*)    All other components within normal limits  HEPATIC FUNCTION PANEL - Abnormal; Notable for the following components:   AST 11 (*)    All other components within normal limits  SARS CORONAVIRUS 2 BY RT PCR  URINALYSIS, ROUTINE W REFLEX MICROSCOPIC  LACTIC ACID, PLASMA  LITHIUM LEVEL  TROPONIN I (HIGH SENSITIVITY)     EKG Normal sinus rhythm, VR 74. PR 200, QRS 138, QTc 437. No acute ST elevations. RBBB. No ST  depressions.   RADIOLOGY CXR: Clear   I also independently reviewed and agree with radiologist interpretations.   PROCEDURES:  Critical Care performed: No  .1-3 Lead EKG Interpretation  Performed by: Duffy Bruce, MD Authorized by: Duffy Bruce, MD     Interpretation: normal     ECG rate:  70-90   ECG rate assessment: normal     Rhythm: sinus rhythm     Ectopy: none     Conduction: normal   Comments:     Indication: Weakness Ultrasound ED Peripheral IV (Provider)  Date/Time: 03/04/2022 6:22 PM  Performed by: Duffy Bruce, MD Authorized by: Duffy Bruce, MD   Procedure details:    Indications: multiple failed IV attempts     Skin Prep: chlorhexidine gluconate     Location:  Left anterior forearm   Angiocath:  20 G   Bedside Ultrasound Guided: Yes     Images: not archived     Patient tolerated procedure without complications: Yes     Dressing applied: Yes       MEDICATIONS ORDERED IN ED: Medications  aspirin EC tablet 81 mg (has no administration in time range)  atorvastatin (LIPITOR) tablet  40 mg (has no administration in time range)  lithium carbonate capsule 300 mg (has no administration in time range)  traZODone (DESYREL) tablet 50-100 mg (has no administration in time range)  carbidopa-levodopa (SINEMET IR) 25-100 MG per tablet immediate release 1 tablet (has no administration in time range)  rOPINIRole (REQUIP) tablet 2 mg (has no administration in time range)  acetaminophen (TYLENOL) tablet 650 mg (has no administration in time range)    Or  acetaminophen (TYLENOL) suppository 650 mg (has no administration in time range)  ondansetron (ZOFRAN) tablet 4 mg (has no administration in time range)    Or  ondansetron (ZOFRAN) injection 4 mg (has no administration in time range)  0.45 % sodium chloride infusion (has no administration in time range)  albuterol (PROVENTIL) (2.5 MG/3ML) 0.083% nebulizer solution 2.5 mg (has no administration in time range)  ipratropium-albuterol (DUONEB) 0.5-2.5 (3) MG/3ML nebulizer solution 3 mL (3 mLs Nebulization Given 03/04/22 1654)  sodium chloride 0.9 % bolus 1,000 mL (1,000 mLs Intravenous New Bag/Given 03/04/22 1739)  ipratropium-albuterol (DUONEB) 0.5-2.5 (3) MG/3ML nebulizer solution 3 mL (3 mLs Nebulization Given 03/04/22 1818)  methylPREDNISolone sodium succinate (SOLU-MEDROL) 125 mg/2 mL injection 125 mg (125 mg Intravenous Given 03/04/22 1816)     IMPRESSION / MDM / ASSESSMENT AND PLAN / ED COURSE  I reviewed the triage vital signs and the nursing notes.                               The patient is on the cardiac monitor to evaluate for evidence of arrhythmia and/or significant heart rate changes.   Ddx:  Differential includes the following, with pertinent life- or limb-threatening emergencies considered:  COVID-19 related weakness, fatigue, pneumonia, dehydration, anemia, polypharmacy  Patient's presentation is most consistent with acute presentation with potential threat to life or bodily function.  MDM:  81 yo M with h/o  COPD, bipolar d/o, Parkinsons here with generalized weakness in setting of COVID-19. Pt with mild wheezing, will start on steroids and nebs. CXR clear. He fell earlier but CT Head, C Spine negative for injury. No focal deficits. Labs show mild lactic acidosis, o/w reassuring. BMP unremarkable. CBC with mild anemia. Will admit to medicine for his weakness, difficulty ambulating in setting of COVID-19  with COPD.   MEDICATIONS GIVEN IN ED: Medications  aspirin EC tablet 81 mg (has no administration in time range)  atorvastatin (LIPITOR) tablet 40 mg (has no administration in time range)  lithium carbonate capsule 300 mg (has no administration in time range)  traZODone (DESYREL) tablet 50-100 mg (has no administration in time range)  carbidopa-levodopa (SINEMET IR) 25-100 MG per tablet immediate release 1 tablet (has no administration in time range)  rOPINIRole (REQUIP) tablet 2 mg (has no administration in time range)  acetaminophen (TYLENOL) tablet 650 mg (has no administration in time range)    Or  acetaminophen (TYLENOL) suppository 650 mg (has no administration in time range)  ondansetron (ZOFRAN) tablet 4 mg (has no administration in time range)    Or  ondansetron (ZOFRAN) injection 4 mg (has no administration in time range)  0.45 % sodium chloride infusion (has no administration in time range)  albuterol (PROVENTIL) (2.5 MG/3ML) 0.083% nebulizer solution 2.5 mg (has no administration in time range)  ipratropium-albuterol (DUONEB) 0.5-2.5 (3) MG/3ML nebulizer solution 3 mL (3 mLs Nebulization Given 03/04/22 1654)  sodium chloride 0.9 % bolus 1,000 mL (1,000 mLs Intravenous New Bag/Given 03/04/22 1739)  ipratropium-albuterol (DUONEB) 0.5-2.5 (3) MG/3ML nebulizer solution 3 mL (3 mLs Nebulization Given 03/04/22 1818)  methylPREDNISolone sodium succinate (SOLU-MEDROL) 125 mg/2 mL injection 125 mg (125 mg Intravenous Given 03/04/22 1816)     Consults:  Hospitalist consulted for  admission   EMR reviewed       FINAL CLINICAL IMPRESSION(S) / ED DIAGNOSES   Final diagnoses:  Weakness  COVID-19     Rx / DC Orders   ED Discharge Orders     None        Note:  This document was prepared using Dragon voice recognition software and may include unintentional dictation errors.   Duffy Bruce, MD 03/04/22 [phone]

## 2022-03-04 NOTE — Plan of Care (Signed)

## 2022-03-04 NOTE — Assessment & Plan Note (Signed)
With frequent falls Most likely related to COVID-19 viral illness Place patient on fall precautions PT evaluate and treat

## 2022-03-04 NOTE — Assessment & Plan Note (Addendum)
Chronic Monitor closely for signs of bleeding SCD for DVT prophylaxis

## 2022-03-04 NOTE — Assessment & Plan Note (Signed)
Continue lidocaine and trazodone

## 2022-03-04 NOTE — Assessment & Plan Note (Signed)
Patient has diet-controlled type 2 diabetes mellitus Place patient on full liquid diet and advance as tolerated Check blood sugars AC meals

## 2022-03-05 ENCOUNTER — Encounter: Payer: Self-pay | Admitting: Internal Medicine

## 2022-03-05 DIAGNOSIS — U071 COVID-19: Secondary | ICD-10-CM | POA: Diagnosis not present

## 2022-03-05 DIAGNOSIS — D696 Thrombocytopenia, unspecified: Secondary | ICD-10-CM | POA: Diagnosis not present

## 2022-03-05 DIAGNOSIS — R531 Weakness: Secondary | ICD-10-CM | POA: Diagnosis not present

## 2022-03-05 LAB — GLUCOSE, CAPILLARY
Glucose-Capillary: 347 mg/dL — ABNORMAL HIGH (ref 70–99)
Glucose-Capillary: 353 mg/dL — ABNORMAL HIGH (ref 70–99)
Glucose-Capillary: 305 mg/dL — ABNORMAL HIGH (ref 70–99)
Glucose-Capillary: 350 mg/dL — ABNORMAL HIGH (ref 70–99)

## 2022-03-05 LAB — HEMOGLOBIN A1C
Hgb A1c MFr Bld: 5.9 % — ABNORMAL HIGH (ref 4.8–5.6)
Mean Plasma Glucose: 122.63 mg/dL

## 2022-03-05 MED ORDER — INSULIN ASPART 100 UNIT/ML IJ SOLN
0.0000 [IU] | Freq: Three times a day (TID) | INTRAMUSCULAR | Status: DC
  Administered 2022-03-05: 7 [IU] via SUBCUTANEOUS
  Administered 2022-03-05: 9 [IU] via SUBCUTANEOUS
  Administered 2022-03-05: 7 [IU] via SUBCUTANEOUS
  Administered 2022-03-06: 2 [IU] via SUBCUTANEOUS
  Administered 2022-03-06: 5 [IU] via SUBCUTANEOUS
  Filled 2022-03-05 (×6): qty 1

## 2022-03-05 MED ORDER — MOLNUPIRAVIR EUA 200MG CAPSULE
4.0000 | ORAL_CAPSULE | Freq: Two times a day (BID) | ORAL | Status: DC
  Administered 2022-03-05 – 2022-03-06 (×3): 800 mg via ORAL
  Filled 2022-03-05: qty 4

## 2022-03-05 NOTE — Progress Notes (Signed)
Progress Note    Craig Wilson  YKD:[id number] DOB: 1940-09-10  DOA: 03/04/2022 PCP: Adin Hector, MD      Brief Narrative:    Medical records reviewed and are as summarized below:  Craig Wilson is a 81 y.o. male with medical history significant for bipolar disorder, Parkinson's disease, hypertension, diabetes mellitus, hyperlipidemia, frequent falls, unsteady gait, chronic diarrhea, tremor, anemia, thrombocytopenia, degenerative joint disease, gout, history of prostate cancer, sleep apnea.  Scented to the hospital because of generalized weakness.  He and his wife tested positive for COVID-19 infection about 3 days prior to admission.  He complained of cough, congestion, sore throat, myalgia and generalized weakness.  He fell at home and night prior to admission while walking back from the bathroom.  He uses a cane at baseline.  However, his wife thinks he does not use his cane regularly he probably fell because he was walking without his cane.  He was admitted to the hospital for COVID-19 infection.  He was treated for molnupiravir and IV fluids.  He was hyperglycemic with glucose level up to 347 so he was treated with insulin.  Hyperglycemia was probably secondary to IV Solu-Medrol was given in the emergency room.      Assessment/Plan:   Principal Problem:   Generalized weakness Active Problems:   Parkinson's disease ([place])   Essential hypertension   Bipolar disorder (H[facility])   Type 2 diabetes mellitus without complication, without long-term current use of insulin (HCC)   Thrombocytopenia ([place])   COVID-19 virus infection    Body mass index is 23.54 kg/m.  COVID-19 infection: Continue airborne and contact precautions.  Start Molnupiravir.  He is not hypoxic so steroids would not be continued.  Type II DM with hyperglycemia.  Hyperglycemia likely due to IV Solu-Medrol was given on 03/04/2022.  Use NovoLog as needed for hyperglycemia.  Last hemoglobin A1c was  6.6 on 11/30/2021.  Repeat hemoglobin A1c.  Generalized weakness fall at home: PT evaluation  Mildly elevated lactic acid at 2.1: Resolved.  Etiology unclear.  He does not meet criteria for sepsis.  Parkinson's disease: Continue ropinirole and carbidopa/levodopa.  Other comorbidities include bipolar disorder, hypertension, chronic thrombocytopenia  Plan discussed with Amy, daughter, over the phone.   Diet Order             Diet full liquid Room service appropriate? Yes; Fluid consistency: Thin  Diet effective now                         Consultants: None  Procedures: None    Medications:    aspirin EC  81 mg Oral Daily   atorvastatin  40 mg Oral QPM   carbidopa-levodopa  2 tablet Oral BID   insulin aspart  0-9 Units Subcutaneous TID WC   lithium carbonate  300 mg Oral BID   molnupiravir EUA  4 capsule Oral BID   rOPINIRole  2 mg Oral QHS   traZODone  50 mg Oral QHS   Continuous Infusions:   Anti-infectives (From admission, onward)    Start     Dose/Rate Route Frequency Ordered Stop   03/05/22 1000  molnupiravir EUA (LAGEVRIO) capsule 800 mg        4 capsule Oral 2 times daily 03/05/22 0800 03/10/22 0959              Family Communication/Anticipated D/C date and plan/Code Status   DVT prophylaxis: SCDs Start: 03/04/22 1750  Code Status: Full Code  Family Communication: Plan discussed with Amy,daughter. Disposition Plan: Plan to discharge home in 1 to 2 days   Status is: Inpatient Remains inpatient appropriate because: COVID-19 infection, generalized weakness       Subjective:   Interval events noted.  He complains of cough, congestion and sore throat.  He feels better today.  He remembered me from his last visit in July 2023 with was hospitalized for fever and confusion..  Objective:    Vitals:   03/04/22 2040 03/04/22 2212 03/05/22 0521 03/05/22 0754  BP:  (!) 160/69 (!) 149/70 (!) 155/58  Pulse:  85 70 76  Resp:  '18  17 18  '$ Temp: 99.1 F (37.3 C) 98.6 F (37 C) 97.6 F (36.4 C) 97.9 F (36.6 C)  TempSrc: Oral   Oral  SpO2:  96% 94% 95%  Weight:  72.3 kg    Height:  '5\' 9"'$  (1.753 m)     No data found.   Intake/Output Summary (Last 24 hours) at 03/05/2022 1144 Last data filed at 03/05/2022 0803 Gross per 24 hour  Intake --  Output 200 ml  Net -200 ml   Filed Weights   03/04/22 1426 03/04/22 2212  Weight: 74.4 kg 72.3 kg    Exam:  GEN: NAD SKIN: Warm and dry EYES: EOMI ENT: MMM CV: RRR PULM: CTA B ABD: soft, ND, NT, +BS CNS: AAO x 3, non focal EXT: No edema or tenderness        Data Reviewed:   I have personally reviewed following labs and imaging studies:  Labs: Labs show the following:   Basic Metabolic Panel: Recent Labs  Lab 03/04/22 1439  NA 139  K 4.6  CL 105  CO2 29  GLUCOSE 162*  BUN 14  CREATININE 0.95  CALCIUM 10.1   GFR Estimated Creatinine Clearance: 58.9 mL/min (by C-G formula based on SCr of 0.95 mg/dL). Liver Function Tests: Recent Labs  Lab 03/04/22 1725  AST 11*  ALT 18  ALKPHOS 72  BILITOT 1.0  PROT 7.1  ALBUMIN 4.3   No results for input(s): "LIPASE", "AMYLASE" in the last 168 hours. No results for input(s): "AMMONIA" in the last 168 hours. Coagulation profile No results for input(s): "INR", "PROTIME" in the last 168 hours.  CBC: Recent Labs  Lab 03/04/22 1439  WBC 8.9  HGB 11.7*  HCT 35.8*  MCV 100.0  PLT 93*   Cardiac Enzymes: No results for input(s): "CKTOTAL", "CKMB", "CKMBINDEX", "TROPONINI" in the last 168 hours. BNP (last 3 results) No results for input(s): "PROBNP" in the last 8760 hours. CBG: Recent Labs  Lab 03/04/22 2214 03/05/22 0755  GLUCAP 314* 347*   D-Dimer: No results for input(s): "DDIMER" in the last 72 hours. Hgb A1c: No results for input(s): "HGBA1C" in the last 72 hours. Lipid Profile: No results for input(s): "CHOL", "HDL", "LDLCALC", "TRIG", "CHOLHDL", "LDLDIRECT" in the last 72  hours. Thyroid function studies: No results for input(s): "TSH", "T4TOTAL", "T3FREE", "THYROIDAB" in the last 72 hours.  Invalid input(s): "FREET3" Anemia work up: No results for input(s): "VITAMINB12", "FOLATE", "FERRITIN", "TIBC", "IRON", "RETICCTPCT" in the last 72 hours. Sepsis Labs: Recent Labs  Lab 03/04/22 1439 03/04/22 1725 03/04/22 2037  WBC 8.9  --   --   LATICACIDVEN  --  2.1* 1.5    Microbiology Recent Results (from the past 240 hour(s))  SARS Coronavirus 2 by RT PCR (hospital order, performed in [facility] hospital lab) *cepheid single result test* Anterior Nasal  Swab     Status: Abnormal   Collection Time: 03/04/22  6:15 PM   Specimen: Anterior Nasal Swab  Result Value Ref Range Status   SARS Coronavirus 2 by RT PCR POSITIVE (A) NEGATIVE Final    Comment: (NOTE) SARS-CoV-2 target nucleic acids are DETECTED  SARS-CoV-2 RNA is generally detectable in upper respiratory specimens  during the acute phase of infection.  Positive results are indicative  of the presence of the identified virus, but do not rule out bacterial infection or co-infection with other pathogens not detected by the test.  Clinical correlation with patient history and  other diagnostic information is necessary to determine patient infection status.  The expected result is negative.  Fact Sheet for Patients:   https://www.patel.info/   Fact Sheet for Healthcare Providers:   https://hall.com/    This test is not yet approved or cleared by the [place] FDA and  has been authorized for detection and/or diagnosis of SARS-CoV-2 by FDA under an Emergency Use Authorization (EUA).  This EUA will remain in effect (meaning this test can be used) for the duration of  the COVID-19 declaration under Section 564(b)(1)  of the Act, 21 U.S.C. section 360-bbb-3(b)(1), unless the authorization is terminated or revoked sooner.   Performed at [facility], [address] [place], [place] [address]     Procedures and diagnostic studies:  CT Lumbar Spine Wo Contrast  Result Date: 03/04/2022 CLINICAL DATA:  Back trauma.  Weakness EXAM: CT LUMBAR SPINE WITHOUT CONTRAST TECHNIQUE: Multidetector CT imaging of the lumbar spine was performed without intravenous contrast administration. Multiplanar CT image reconstructions were also generated. RADIATION DOSE REDUCTION: This exam was performed according to the departmental dose-optimization program which includes automated exposure control, adjustment of the mA and/or kV according to patient size and/or use of iterative reconstruction technique. COMPARISON:  CT 11/05/2020 FINDINGS: Segmentation: 5 lumbar type vertebrae. Alignment: Normal. Vertebrae: No acute fracture or focal pathologic process. Paraspinal and other soft tissues: Cholelithiasis. Aortic atherosclerosis. No acute findings. Disc levels: Similar degree of degenerative lumbar spondylosis most pronounced at the L4-5 level where there is right-sided neuroforaminal narrowing. No evidence of high-grade canal stenosis by CT. IMPRESSION: 1. No acute fracture or traumatic malalignment of the lumbar spine. 2. Similar degree of degenerative lumbar spondylosis most pronounced at the L4-5 level where there is right-sided neuroforaminal narrowing. 3. Cholelithiasis. 4. Aortic atherosclerosis (ICD10-I70.0). Electronically Signed   By: Davina Poke D.O.   On: 03/04/2022 16:54   CT HEAD WO CONTRAST (5MM)  Result Date: 03/04/2022 CLINICAL DATA:  Head and neck trauma. EXAM: CT HEAD WITHOUT CONTRAST CT CERVICAL SPINE WITHOUT CONTRAST TECHNIQUE: Multidetector CT imaging of the head and cervical spine was performed following the standard protocol without intravenous contrast. Multiplanar CT image reconstructions of the cervical spine were also generated. RADIATION DOSE REDUCTION: This exam was performed according to the departmental dose-optimization program  which includes automated exposure control, adjustment of the mA and/or kV according to patient size and/or use of iterative reconstruction technique. COMPARISON:  December 08, 2021.  November 05, 2020. FINDINGS: CT HEAD FINDINGS Brain: No evidence of acute infarction, hemorrhage, hydrocephalus, extra-axial collection or mass lesion/mass effect. Vascular: No hyperdense vessel or unexpected calcification. Skull: Normal. Negative for fracture or focal lesion. Sinuses/Orbits: Bilateral maxillary mucosal thickening is noted. Other: None. CT CERVICAL SPINE FINDINGS Alignment: Normal. Skull base and vertebrae: No acute fracture. No primary bone lesion or focal pathologic process. Soft tissues and spinal canal: No prevertebral fluid or swelling.  No visible canal hematoma. Disc levels:  Severe degenerative disc disease is noted at C4-5. Upper chest: Negative. Other: None. IMPRESSION: No acute intracranial abnormality seen. 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 No acute abnormality seen in the cervical spine. Electronically Signed   By: Marijo Conception M.D.   On: 03/04/2022 16:53   CT Cervical Spine Wo Contrast  Result Date: 03/04/2022 CLINICAL DATA:  Head and neck trauma. EXAM: CT HEAD WITHOUT CONTRAST CT CERVICAL SPINE WITHOUT CONTRAST TECHNIQUE: Multidetector CT imaging of the head and cervical spine was performed following the standard protocol without intravenous contrast. Multiplanar CT image reconstructions of the cervical spine were also generated. RADIATION DOSE REDUCTION: This exam was performed according to the departmental dose-optimization program which includes automated exposure control, adjustment of the mA and/or kV according to patient size and/or use of iterative reconstruction technique. COMPARISON:  December 08, 2021.  November 05, 2020. FINDINGS: CT HEAD FINDINGS Brain: No evidence of acute infarction, hemorrhage, hydrocephalus, extra-axial collection or mass lesion/mass effect. Vascular: No hyperdense  vessel or unexpected calcification. Skull: Normal. Negative for fracture or focal lesion. Sinuses/Orbits: Bilateral maxillary mucosal thickening is noted. Other: None. CT CERVICAL SPINE FINDINGS Alignment: Normal. Skull base and vertebrae: No acute fracture. No primary bone lesion or focal pathologic process. Soft tissues and spinal canal: No prevertebral fluid or swelling. No visible canal hematoma. Disc levels:  Severe degenerative disc disease is noted at C4-5. Upper chest: Negative. Other: None. IMPRESSION: No acute intracranial abnormality seen. 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 No acute abnormality seen in the cervical spine. Electronically Signed   By: Marijo Conception M.D.   On: 03/04/2022 16:53   CT PELVIS WO CONTRAST  Result Date: 03/04/2022 CLINICAL DATA:  Hip and tailbone pain after fall EXAM: CT PELVIS WITHOUT CONTRAST TECHNIQUE: Multidetector CT imaging of the pelvis was performed following the standard protocol without intravenous contrast. RADIATION DOSE REDUCTION: This exam was performed according to the departmental dose-optimization program which includes automated exposure control, adjustment of the mA and/or kV according to patient size and/or use of iterative reconstruction technique. COMPARISON:  12/09/2021, 11/05/2020 FINDINGS: Urinary Tract: Bladder is decompressed, limiting its evaluation. Distal ureters are unremarkable. Bowel: No bowel obstruction or ileus. Scattered diverticulosis of the sigmoid colon without evidence of acute diverticulitis. No bowel wall thickening or inflammatory change. Vascular/Lymphatic: Atherosclerosis of the aorta and its branches. No pathologic adenopathy. Reproductive: Status post prostatectomy. Implanted urology device right lower quadrant, likely related to prior urinary continence procedure. Other: No free fluid or free intraperitoneal gas. No abdominal wall hernia. Musculoskeletal: No acute displaced fracture. Specifically, no evidence of  fracture within either hip, sacrum, or coccyx. Reconstructed images demonstrate no additional findings. IMPRESSION: 1. No evidence of acute displaced pelvic fracture. 2. Sigmoid diverticulosis without diverticulitis. 3.  Aortic Atherosclerosis (ICD10-I70.0). Electronically Signed   By: Randa Ngo M.D.   On: 03/04/2022 16:52   DG Chest 2 View  Result Date: 03/04/2022 CLINICAL DATA:  -shortness of breath, weakness, tested positive for COVID-19 EXAM: CHEST - 2 VIEW COMPARISON:  12/08/2021 FINDINGS: Normal heart size, mediastinal contours, and pulmonary vascularity. Lungs clear. No pulmonary infiltrate, pleural effusion, or pneumothorax. Bones demineralized with scattered endplate spur formation thoracic spine. IMPRESSION: No acute abnormalities. Electronically Signed   By: Lavonia Dana M.D.   On: 03/04/2022 16:10               LOS: 1 day   [place]  [facility]  Pager on [email or web address]. If 7PM-7AM, please contact night-coverage at [email or web address]     03/05/2022, 11:44 AM

## 2022-03-05 NOTE — Evaluation (Signed)
Occupational Therapy Evaluation Patient Details Name: Craig Wilson MRN: [id number] DOB: 30-Jul-1940 Today's Date: 03/05/2022   History of Present Illness Pt admitted for generalized weakness in relation to covid. Report falls leading to admission. History includes bipolar, Parkinsons, HTN, DM, and HLD.   Clinical Impression   Patient seen for OT evaluation. Patient presenting with impaired cognition, balance, strength, and endurance impacting safety and independence in ADLs. At baseline, pt was independent with ADLs, shared IADL responsibility with wife, and was Mod I for functional mobility using SPC. Pt A&Ox3 (disoriented to time). He was able to follow all commands this date and give history/PLOF. Patient currently functioning at [facility] guard for simulated toilet transfer, set up-supervision for seated LB dressing, and Min guard fir functional mobility using SPC. Patient will benefit from acute OT to increase overall independence in the areas of ADLs and functional mobility in order to safely discharge home. Pt could benefit from [facility] following D/C to decrease falls risk, improve balance, and maximize independence in self-care within own home environment.      Recommendations for follow up therapy are one component of a multi-disciplinary discharge planning process, led by the attending physician.  Recommendations may be updated based on patient status, additional functional criteria and insurance authorization.   Follow Up Recommendations  Home health OT    Assistance Recommended at Discharge PRN  Patient can return home with the following A little help with walking and/or transfers;A little help with bathing/dressing/bathroom;Assistance with cooking/housework;Help with stairs or ramp for entrance    Functional Status Assessment  Patient has had a recent decline in their functional status and demonstrates the ability to make significant improvements in function in a reasonable and  predictable amount of time.  Equipment Recommendations  None recommended by OT    Recommendations for Other Services       Precautions / Restrictions Precautions Precautions: Fall Restrictions Weight Bearing Restrictions: No      Mobility Bed Mobility               General bed mobility comments: NT, pt received and left in recliner    Transfers Overall transfer level: Needs assistance Equipment used: Straight cane Transfers: Sit to/from Stand Sit to Stand: Min guard                  Balance Overall balance assessment: Needs assistance, History of Falls Sitting-balance support: Feet supported, Bilateral upper extremity supported Sitting balance-Leahy Scale: Good     Standing balance support: Single extremity supported, During functional activity Standing balance-Leahy Scale: Fair Standing balance comment: during simulation of clothing management in standing                           ADL either performed or assessed with clinical judgement   ADL Overall ADL's : Needs assistance/impaired                     Lower Body Dressing: Set up;Supervision/safety;Sitting/lateral leans Lower Body Dressing Details (indicate cue type and reason): to don/doff socks in sitting, able to demonstrate figure 4 technique Toilet Transfer: Min Psychiatric nurse Details (indicate cue type and reason): simulated with STS from recliner using [place] and Hygiene: Min guard;Sit to/from stand       Functional mobility during ADLs: Min guard;Cane (at room level) General ADL Comments: urinary incontinence with mobility     Vision Baseline Vision/History: 1 Wears glasses Patient Visual Report:  No change from baseline       Perception     Praxis      Pertinent Vitals/Pain Pain Assessment Pain Assessment: Faces Faces Pain Scale: Hurts a little bit Pain Location: chest during coughing Pain Descriptors / Indicators:  Discomfort Pain Intervention(s): Limited activity within patient's tolerance     Hand Dominance     Extremity/Trunk Assessment Upper Extremity Assessment Upper Extremity Assessment: Overall WFL for tasks assessed   Lower Extremity Assessment Lower Extremity Assessment: Overall WFL for tasks assessed       Communication Communication Communication: HOH   Cognition Arousal/Alertness: Awake/alert Behavior During Therapy: WFL for tasks assessed/performed Overall Cognitive Status: History of cognitive impairments - at baseline                                 General Comments: A&Ox3 (disoriented to date), able to follow all commands     General Comments       Exercises Other Exercises Other Exercises: OT provided education re: role of OT, OT POC, post acute recs, sitting up for all meals, EOB/OOB mobility with assistance, home/fall safety.     Shoulder Instructions      Home Living Family/patient expects to be discharged to:: Private residence Living Arrangements: Spouse/significant other Available Help at Discharge: Family;Available 24 hours/day Type of Home: House Home Access: Stairs to enter [employer] of Steps: 1 small threshold step Entrance Stairs-Rails: None Home Layout: One level     Bathroom Shower/Tub: Occupational psychologist: Handicapped height     Home Equipment: Cane - single point;Wheelchair Probation officer (4 wheels);Rolling Walker (2 wheels)          Prior Functioning/Environment Prior Level of Function : Driving;History of Falls (last six months);Independent/Modified Independent             Mobility Comments: uses SPC, but endorces recent falls. Does have a few cats that he is able to get outside to tend to ADLs Comments: Pt reports IND with self care, pt and wife split IADL responsibility. 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 Urinary incontinence at baseline        OT Problem List: Decreased  strength;Decreased activity tolerance;Impaired balance (sitting and/or standing);Decreased cognition;Decreased safety awareness      OT Treatment/Interventions: Self-care/ADL training;Therapeutic exercise;Patient/family education;Balance training;Energy conservation;Therapeutic activities;DME and/or AE instruction;Cognitive remediation/compensation    OT Goals(Current goals can be found in the care plan section) Acute Rehab OT Goals Patient Stated Goal: go home OT Goal Formulation: With patient Time For Goal Achievement: 03/19/22 Potential to Achieve Goals: Good ADL Goals Pt Will Perform Grooming: with modified independence;standing Pt Will Perform Lower Body Dressing: with modified independence;sit to/from stand;sitting/lateral leans Pt Will Transfer to Toilet: with modified independence;ambulating;bedside commode Pt Will Perform Toileting - Clothing Manipulation and hygiene: with modified independence;sit to/from stand Additional ADL Goal #1: Pt will verbalize 2-3 Energy conservation techniques with Min VC  OT Frequency: Min 2X/week    Co-evaluation              AM-PAC OT "6 Clicks" Daily Activity     Outcome Measure Help from another person eating meals?: None Help from another person taking care of personal grooming?: A Little Help from another person toileting, which includes using toliet, bedpan, or urinal?: A Little Help from another person bathing (including washing, rinsing, drying)?: A Little Help from another person to put on and taking off regular upper body  clothing?: None Help from another person to put on and taking off regular lower body clothing?: A Little 6 Click Score: 20   End of Session Equipment Utilized During Treatment: Gait belt;Other (comment) [facility]) Nurse Communication: Mobility status;Other (comment) (to replace condom catheter)  Activity Tolerance: Patient tolerated treatment well Patient left: in chair;with call bell/phone within reach;with chair  alarm set  OT Visit Diagnosis: Unsteadiness on feet (R26.81);Repeated falls (R29.6);Muscle weakness (generalized) (M62.81)                Time: 2951-8841 OT Time Calculation (min): 22 min Charges:  OT General Charges $OT Visit: 1 Visit OT Evaluation $OT Eval Low Complexity: 1 Low  [facility] MS, OTR/L ascom [phone]  03/05/22, 5:17 PM

## 2022-03-05 NOTE — Evaluation (Signed)
Physical Therapy Evaluation Patient Details Name: Craig Wilson MRN: [id number] DOB: [birth year] Today's Date: 03/05/2022  History of Present Illness  Pt admitted for generalized weakness in relation to covid. Report falls leading to admission. History includes bipolar, Parkinsons, HTN, DM, and HLD.  Clinical Impression  Pt is a pleasant 81 year old male who was admitted for generalized weakness secondary to covid. Pt performs bed mobility, transfers, and ambulation with cga and RW. Pt demonstrates deficits with endurance/balance. Of note pt incontinent with mobility. Would benefit from skilled PT to address above deficits and promote optimal return to PLOF. Recommend transition to [place] upon discharge from acute hospitalization.      Recommendations for follow up therapy are one component of a multi-disciplinary discharge planning process, led by the attending physician.  Recommendations may be updated based on patient status, additional functional criteria and insurance authorization.  Follow Up Recommendations Home health PT (pt currently refusing)      Assistance Recommended at Discharge PRN  Patient can return home with the following  A little help with walking and/or transfers;A little help with bathing/dressing/bathroom    Equipment Recommendations None recommended by PT  Recommendations for Other Services       Functional Status Assessment Patient has had a recent decline in their functional status and demonstrates the ability to make significant improvements in function in a reasonable and predictable amount of time.     Precautions / Restrictions Precautions Precautions: Fall Restrictions Weight Bearing Restrictions: No      Mobility  Bed Mobility Overal bed mobility: Needs Assistance Bed Mobility: Supine to Sit     Supine to sit: Min guard     General bed mobility comments: safe technique    Transfers Overall transfer level: Needs assistance Equipment  used: Rolling walker (2 wheels) Transfers: Sit to/from Stand Sit to Stand: Min guard           General transfer comment: safe technique with hand placement. Upright posture with use of RW    Ambulation/Gait Ambulation/Gait assistance: Min guard Gait Distance (Feet): 40 Feet Assistive device: Rolling walker (2 wheels), Straight cane Gait Pattern/deviations: Step-through pattern       General Gait Details: ambulated short distance with RW and then ambulated short distance with SPC (baseline). Pt reports feeling more stable with RW as he was reaching for furniture with uni-AD.  Stairs            Wheelchair Mobility    Modified Rankin (Stroke Patients Only)       Balance Overall balance assessment: Needs assistance, History of Falls Sitting-balance support: Feet supported, Bilateral upper extremity supported Sitting balance-Leahy Scale: Good     Standing balance support: Bilateral upper extremity supported Standing balance-Leahy Scale: Fair                               Pertinent Vitals/Pain Pain Assessment Pain Assessment: Faces Faces Pain Scale: Hurts a little bit Pain Location: chest during coughing Pain Descriptors / Indicators: Discomfort Pain Intervention(s): Limited activity within patient's tolerance    Home Living Family/patient expects to be discharged to:: Private residence Living Arrangements: Spouse/significant other Available Help at Discharge: Family;Available 24 hours/day Type of Home: House Home Access: Stairs to enter Entrance Stairs-Rails: None Entrance Stairs-Number of Steps: 1 small threshold step   Home Layout: One level Home Equipment: Cane - single point;Wheelchair Probation officer (4 wheels);Rolling Walker (2 wheels)      Prior  Function Prior Level of Function : Driving;History of Falls (last six months)             Mobility Comments: uses SPC, but endorces recent falls. 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     Hand Dominance        Extremity/Trunk Assessment   Upper Extremity Assessment Upper Extremity Assessment: Overall WFL for tasks assessed    Lower Extremity Assessment Lower Extremity Assessment: Overall WFL for tasks assessed       Communication   Communication: HOH  Cognition Arousal/Alertness: Awake/alert Behavior During Therapy: WFL for tasks assessed/performed Overall Cognitive Status: History of cognitive impairments - at baseline                                 General Comments: confused to date        General Comments      Exercises     Assessment/Plan    PT Assessment Patient needs continued PT services  PT Problem List Decreased strength;Decreased activity tolerance;Decreased balance;Decreased mobility       PT Treatment Interventions Gait training;DME instruction;Therapeutic exercise;Balance training    PT Goals (Current goals can be found in the Care Plan section)  Acute Rehab PT Goals Patient Stated Goal: to go home PT Goal Formulation: With patient Time For Goal Achievement: 03/19/22 Potential to Achieve Goals: Good    Frequency Min 2X/week     Co-evaluation               AM-PAC PT "6 Clicks" Mobility  Outcome Measure Help needed turning from your back to your side while in a flat bed without using bedrails?: None Help needed moving from lying on your back to sitting on the side of a flat bed without using bedrails?: None Help needed moving to and from a bed to a chair (including a wheelchair)?: A Little Help needed standing up from a chair using your arms (e.g., wheelchair or bedside chair)?: A Little Help needed to walk in hospital room?: A Little Help needed climbing 3-5 steps with a railing? : A Little 6 Click Score: 20    End of Session Equipment Utilized During Treatment: Gait belt Activity Tolerance: Patient tolerated treatment  well Patient left: in chair;with chair alarm set Nurse Communication: Mobility status PT Visit Diagnosis: Unsteadiness on feet (R26.81);Muscle weakness (generalized) (M62.81);History of falling (Z91.81);Difficulty in walking, not elsewhere classified (R26.2)    Time: 1761-6073 PT Time Calculation (min) (ACUTE ONLY): 38 min   Charges:   PT Evaluation $PT Eval Low Complexity: 1 Low PT Treatments $Gait Training: 23-37 mins        Greggory Stallion, PT, DPT, GCS [phone]   Lya Holben 03/05/2022, 3:36 PM

## 2022-03-06 LAB — GLUCOSE, CAPILLARY
Glucose-Capillary: 168 mg/dL — ABNORMAL HIGH (ref 70–99)
Glucose-Capillary: 300 mg/dL — ABNORMAL HIGH (ref 70–99)
Glucose-Capillary: 331 mg/dL — ABNORMAL HIGH (ref 70–99)

## 2022-03-06 MED ORDER — HYDRALAZINE HCL 20 MG/ML IJ SOLN: 10.0000 mg | INTRAMUSCULAR | Status: DC | PRN

## 2022-03-06 MED ORDER — GUAIFENESIN-DM 100-10 MG/5ML PO SYRP: 5.0000 mL | ORAL_SOLUTION | ORAL | 0 refills | Status: AC | PRN

## 2022-03-06 MED ORDER — ALBUTEROL SULFATE HFA 108 (90 BASE) MCG/ACT IN AERS: 2.0000 | INHALATION_SPRAY | Freq: Four times a day (QID) | RESPIRATORY_TRACT | 0 refills | Status: AC | PRN

## 2022-03-06 MED ORDER — GUAIFENESIN-DM 100-10 MG/5ML PO SYRP
5.0000 mL | ORAL_SOLUTION | ORAL | Status: DC | PRN
  Administered 2022-03-06: 5 mL via ORAL
  Filled 2022-03-06: qty 10

## 2022-03-06 MED ORDER — MOLNUPIRAVIR EUA 200MG CAPSULE: 4.0000 | ORAL_CAPSULE | Freq: Two times a day (BID) | ORAL | 0 refills | Status: AC

## 2022-03-06 MED ORDER — BENZONATATE 100 MG PO CAPS: 100.0000 mg | ORAL_CAPSULE | Freq: Three times a day (TID) | ORAL | 0 refills | Status: AC | PRN

## 2022-03-06 NOTE — Discharge Summary (Signed)
Physician Discharge Summary  Craig Wilson RKY:[id number] DOB: 1941/03/10 DOA: 03/04/2022  PCP: Adin Hector, MD  Admit date: 03/04/2022 Discharge date: 03/06/2022  Admitted From: [facility] Disposition:  Home  Recommendations for Outpatient Follow-up:  Follow up with PCP in 1-2 weeks   Home Health:No Equipment/Devices:None   Discharge Condition:Stable  CODE STATUS:FULL  Diet recommendation: Reg  Brief/Interim Summary: Craig Wilson is a 81 y.o. male with medical history significant for bipolar disorder, Parkinson's disease, hypertension, diabetes mellitus, hyperlipidemia, frequent falls, unsteady gait, chronic diarrhea, tremor, anemia, thrombocytopenia, degenerative joint disease, gout, history of prostate cancer, sleep apnea.  Sent to the hospital because of generalized weakness.  He and his wife tested positive for COVID-19 infection about 3 days prior to admission.  He complained of cough, congestion, sore throat, myalgia and generalized weakness.  He fell at home and night prior to admission while walking back from the bathroom.  He uses a cane at baseline.  However, his wife thinks he does not use his cane regularly he probably fell because he was walking without his cane.   He was admitted to the hospital for COVID-19 infection.  He was treated for molnupiravir and IV fluids.  He was hyperglycemic with glucose level up to 347 so he was treated with insulin.  Hyperglycemia was probably secondary to IV Solu-Medrol was given in the emergency room.  Patient symptomatically improved at time of discharge.  On room air, no fevers.  No clinical indication of persistent infection.  Appropriate for discharge at this time.  Recommend discharge home with home health.  Patient states that he has had prior bad experiences with home health services and declines at this time.  In regards to elevated blood sugar patient's A1c is 5.9.  Can consider addition of oral antihyperglycemic's but will  not initiate at this time.  Recommended patient that he discussed this with his primary care physician Dr. Caryl Comes at next visit.  In regards to patient's elevated blood pressure he does not have history of medication dependent hypertension.  I recommend the patient check his blood pressure on home blood pressure cuff twice daily, record results and bring to next visit with Dr. Caryl Comes.  Patient may be a good candidate for chronic antihypertensive therapy however given that blood pressure could be affected by stress of hospitalization and recent COVID infection will not initiate antihypertensives at this time.      Discharge Diagnoses:  Principal Problem:   Generalized weakness Active Problems:   Parkinson's disease ([place])   Essential hypertension   Bipolar disorder ([facility])   Type 2 diabetes mellitus without complication, without long-term current use of insulin ([facility])   Thrombocytopenia ([place])   COVID-19 virus infection  COVID-19 infection: Relatively mild.  No hypoxia or fevers noted.  No steroids indicated.  Will complete course of molnupiravir at time of discharge.     Type II DM with hyperglycemia.  Hyperglycemia likely due to IV Solu-Medrol was given on 03/04/2022.  Use NovoLog as needed for hyperglycemia.  Last hemoglobin A1c was 6.6 on 11/30/2021.  Repeat hemoglobin A1c 5.9.  We will not initiate diabetic medications at this time.  Suggest patient discuss elevated blood sugars at next visit with PCP Dr. Caryl Comes.   Generalized weakness fall at home: PT evaluation would recommend home with home health.  Patient declined home health services due to prior bad experiences  Hypertension: Patient does not have any chronic blood pressure medications on admission medication reconciliation.  His blood pressures were  running high in the hospital.  Given that blood pressure could be artificially augmented in setting of hospitalization and COVID infection we will not initiate antihypertensives at this time.   Patient may be a good candidate for chronic antihypertensive therapy.  Recommend that patient check his blood pressure twice daily at home with his home blood pressure cuff and bring results to next visit with PCP Dr. Caryl Comes.    Discharge Instructions  Discharge Instructions     Diet - low sodium heart healthy   Complete by: As directed    Increase activity slowly   Complete by: As directed       Allergies as of 03/06/2022       Reactions   Carbidopa W-levodopa Other (See Comments)   irrational behavior , felt zombie like   Carbidopa-levodopa Other (See Comments)   irrational behavior , felt zombie like   Latex Other (See Comments)   Internal such as catheters. Latex gloves are okay   Solifenacin Other (See Comments)        Medication List     TAKE these medications    albuterol 108 (90 Base) MCG/ACT inhaler Commonly known as: VENTOLIN HFA Inhale 2 puffs into the lungs every 6 (six) hours as needed for wheezing or shortness of breath.   aspirin EC 81 MG tablet Take 81 mg by mouth daily. Swallow whole.   atorvastatin 40 MG tablet Commonly known as: LIPITOR Take 1 tablet (40 mg total) by mouth every evening.   benzonatate 100 MG capsule Commonly known as: Tessalon Perles Take 1 capsule (100 mg total) by mouth 3 (three) times daily as needed for cough.   carbidopa-levodopa 25-100 MG tablet Commonly known as: SINEMET IR Take 2 tablets by mouth 2 (two) times daily. What changed: Another medication with the same name was removed. Continue taking this medication, and follow the directions you see here.   guaiFENesin-dextromethorphan 100-10 MG/5ML syrup Commonly known as: ROBITUSSIN DM Take 5 mLs by mouth every 4 (four) hours as needed for cough.   lithium carbonate 300 MG capsule Take 300 mg by mouth 2 (two) times daily.   loperamide 2 MG capsule Commonly known as: IMODIUM Take 1 capsule (2 mg total) by mouth as needed for diarrhea or loose stools.    molnupiravir EUA 200 mg Caps capsule Commonly known as: LAGEVRIO Take 4 capsules (800 mg total) by mouth 2 (two) times daily for 4 days.   nystatin cream Commonly known as: MYCOSTATIN Apply 1 Application topically 2 (two) times daily as needed.   rOPINIRole 2 MG tablet Commonly known as: REQUIP Take 2 mg by mouth at bedtime.   traZODone 50 MG tablet Commonly known as: DESYREL Take 50-100 mg by mouth at bedtime.        Allergies  Allergen Reactions   Carbidopa W-Levodopa Other (See Comments)    irrational behavior , felt zombie like   Carbidopa-Levodopa Other (See Comments)    irrational behavior , felt zombie like   Latex Other (See Comments)    Internal such as catheters. Latex gloves are okay   Solifenacin Other (See Comments)    Consultations: None   Procedures/Studies: CT Lumbar Spine Wo Contrast  Result Date: 03/04/2022 CLINICAL DATA:  Back trauma.  Weakness EXAM: CT LUMBAR SPINE WITHOUT CONTRAST TECHNIQUE: Multidetector CT imaging of the lumbar spine was performed without intravenous contrast administration. Multiplanar CT image reconstructions were also generated. RADIATION DOSE REDUCTION: This exam was performed according to the departmental dose-optimization program which includes  automated exposure control, adjustment of the mA and/or kV according to patient size and/or use of iterative reconstruction technique. COMPARISON:  CT 11/05/2020 FINDINGS: Segmentation: 5 lumbar type vertebrae. Alignment: Normal. Vertebrae: No acute fracture or focal pathologic process. Paraspinal and other soft tissues: Cholelithiasis. Aortic atherosclerosis. No acute findings. Disc levels: Similar degree of degenerative lumbar spondylosis most pronounced at the L4-5 level where there is right-sided neuroforaminal narrowing. No evidence of high-grade canal stenosis by CT. IMPRESSION: 1. No acute fracture or traumatic malalignment of the lumbar spine. 2. Similar degree of degenerative lumbar  spondylosis most pronounced at the L4-5 level where there is right-sided neuroforaminal narrowing. 3. Cholelithiasis. 4. Aortic atherosclerosis (ICD10-I70.0). Electronically Signed   By: Davina Poke D.O.   On: 03/04/2022 16:54   CT HEAD WO CONTRAST (5MM)  Result Date: 03/04/2022 CLINICAL DATA:  Head and neck trauma. EXAM: CT HEAD WITHOUT CONTRAST CT CERVICAL SPINE WITHOUT CONTRAST TECHNIQUE: Multidetector CT imaging of the head and cervical spine was performed following the standard protocol without intravenous contrast. Multiplanar CT image reconstructions of the cervical spine were also generated. RADIATION DOSE REDUCTION: This exam was performed according to the departmental dose-optimization program which includes automated exposure control, adjustment of the mA and/or kV according to patient size and/or use of iterative reconstruction technique. COMPARISON:  December 08, 2021.  November 05, 2020. FINDINGS: CT HEAD FINDINGS Brain: No evidence of acute infarction, hemorrhage, hydrocephalus, extra-axial collection or mass lesion/mass effect. Vascular: No hyperdense vessel or unexpected calcification. Skull: Normal. Negative for fracture or focal lesion. Sinuses/Orbits: Bilateral maxillary mucosal thickening is noted. Other: None. CT CERVICAL SPINE FINDINGS Alignment: Normal. Skull base and vertebrae: No acute fracture. No primary bone lesion or focal pathologic process. Soft tissues and spinal canal: No prevertebral fluid or swelling. No visible canal hematoma. Disc levels:  Severe degenerative disc disease is noted at C4-5. Upper chest: Negative. Other: None. IMPRESSION: No acute intracranial abnormality seen. 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 No acute abnormality seen in the cervical spine. Electronically Signed   By: Marijo Conception M.D.   On: 03/04/2022 16:53   CT Cervical Spine Wo Contrast  Result Date: 03/04/2022 CLINICAL DATA:  Head and neck trauma. EXAM: CT HEAD WITHOUT CONTRAST CT  CERVICAL SPINE WITHOUT CONTRAST TECHNIQUE: Multidetector CT imaging of the head and cervical spine was performed following the standard protocol without intravenous contrast. Multiplanar CT image reconstructions of the cervical spine were also generated. RADIATION DOSE REDUCTION: This exam was performed according to the departmental dose-optimization program which includes automated exposure control, adjustment of the mA and/or kV according to patient size and/or use of iterative reconstruction technique. COMPARISON:  December 08, 2021.  November 05, 2020. FINDINGS: CT HEAD FINDINGS Brain: No evidence of acute infarction, hemorrhage, hydrocephalus, extra-axial collection or mass lesion/mass effect. Vascular: No hyperdense vessel or unexpected calcification. Skull: Normal. Negative for fracture or focal lesion. Sinuses/Orbits: Bilateral maxillary mucosal thickening is noted. Other: None. CT CERVICAL SPINE FINDINGS Alignment: Normal. Skull base and vertebrae: No acute fracture. No primary bone lesion or focal pathologic process. Soft tissues and spinal canal: No prevertebral fluid or swelling. No visible canal hematoma. Disc levels:  Severe degenerative disc disease is noted at C4-5. Upper chest: Negative. Other: None. IMPRESSION: No acute intracranial abnormality seen. 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 No acute abnormality seen in the cervical spine. Electronically Signed   By: Marijo Conception M.D.   On: 03/04/2022 16:53   CT PELVIS WO CONTRAST  Result Date: 03/04/2022 CLINICAL DATA:  Hip and tailbone pain after fall EXAM: CT PELVIS WITHOUT CONTRAST TECHNIQUE: Multidetector CT imaging of the pelvis was performed following the standard protocol without intravenous contrast. RADIATION DOSE REDUCTION: This exam was performed according to the departmental dose-optimization program which includes automated exposure control, adjustment of the mA and/or kV according to patient size and/or use of iterative  reconstruction technique. COMPARISON:  12/09/2021, 11/05/2020 FINDINGS: Urinary Tract: Bladder is decompressed, limiting its evaluation. Distal ureters are unremarkable. Bowel: No bowel obstruction or ileus. Scattered diverticulosis of the sigmoid colon without evidence of acute diverticulitis. No bowel wall thickening or inflammatory change. Vascular/Lymphatic: Atherosclerosis of the aorta and its branches. No pathologic adenopathy. Reproductive: Status post prostatectomy. Implanted urology device right lower quadrant, likely related to prior urinary continence procedure. Other: No free fluid or free intraperitoneal gas. No abdominal wall hernia. Musculoskeletal: No acute displaced fracture. Specifically, no evidence of fracture within either hip, sacrum, or coccyx. Reconstructed images demonstrate no additional findings. IMPRESSION: 1. No evidence of acute displaced pelvic fracture. 2. Sigmoid diverticulosis without diverticulitis. 3.  Aortic Atherosclerosis (ICD10-I70.0). Electronically Signed   By: Randa Ngo M.D.   On: 03/04/2022 16:52   DG Chest 2 View  Result Date: 03/04/2022 CLINICAL DATA:  -shortness of breath, weakness, tested positive for COVID-19 EXAM: CHEST - 2 VIEW COMPARISON:  12/08/2021 FINDINGS: Normal heart size, mediastinal contours, and pulmonary vascularity. Lungs clear. No pulmonary infiltrate, pleural effusion, or pneumothorax. Bones demineralized with scattered endplate spur formation thoracic spine. IMPRESSION: No acute abnormalities. Electronically Signed   By: Lavonia Dana M.D.   On: 03/04/2022 16:10      Subjective: Seen and examined on day of DC.  Stable no distress.  On room air, afebrile.  Appropriate for discharge home.  Discharge Exam: Vitals:   03/06/22 0547 03/06/22 0838  BP: (!) 170/65 (!) 151/82  Pulse: 60 64  Resp:  20  Temp:  98 F (36.7 C)  SpO2:  100%   Vitals:   03/05/22 2130 03/06/22 0515 03/06/22 0547 03/06/22 0838  BP: (!) 152/58 (!) 184/60 (!)  170/65 (!) 151/82  Pulse: 66 67 60 64  Resp: '17 18  20  '$ Temp: 98.6 F (37 C) 97.8 F (36.6 C)  98 F (36.7 C)  TempSrc:      SpO2: 98% 99%  100%  Weight:      Height:        General: Pt is alert, awake, not in acute distress Cardiovascular: RRR, S1/S2 +, no rubs, no gallops Respiratory: CTA bilaterally, no wheezing, no rhonchi Abdominal: Soft, NT, ND, bowel sounds + Extremities: no edema, no cyanosis    The results of significant diagnostics from this hospitalization (including imaging, microbiology, ancillary and laboratory) are listed below for reference.     Microbiology: Recent Results (from the past 240 hour(s))  SARS Coronavirus 2 by RT PCR (hospital order, performed in [facility] hospital lab) *cepheid single result test* Anterior Nasal Swab     Status: Abnormal   Collection Time: 03/04/22  6:15 PM   Specimen: Anterior Nasal Swab  Result Value Ref Range Status   SARS Coronavirus 2 by RT PCR POSITIVE (A) NEGATIVE Final    Comment: (NOTE) SARS-CoV-2 target nucleic acids are DETECTED  SARS-CoV-2 RNA is generally detectable in upper respiratory specimens  during the acute phase of infection.  Positive results are indicative  of the presence of the identified virus, but do not rule out bacterial infection or co-infection with other  pathogens not detected by the test.  Clinical correlation with patient history and  other diagnostic information is necessary to determine patient infection status.  The expected result is negative.  Fact Sheet for Patients:   https://www.patel.info/   Fact Sheet for Healthcare Providers:   https://hall.com/    This test is not yet approved or cleared by the [place] FDA and  has been authorized for detection and/or diagnosis of SARS-CoV-2 by FDA under an Emergency Use Authorization (EUA).  This EUA will remain in effect (meaning this test can be used) for the duration of  the COVID-19  declaration under Section 564(b)(1)  of the Act, 21 U.S.C. section 360-bbb-3(b)(1), unless the authorization is terminated or revoked sooner.   Performed at [facility], [address] [place], [place] [address]      Labs: BNP (last 3 results) Recent Labs    03/04/22 1725  BNP 970.2*   Basic Metabolic Panel: Recent Labs  Lab 03/04/22 1439  NA 139  K 4.6  CL 105  CO2 29  GLUCOSE 162*  BUN 14  CREATININE 0.95  CALCIUM 10.1   Liver Function Tests: Recent Labs  Lab 03/04/22 1725  AST 11*  ALT 18  ALKPHOS 72  BILITOT 1.0  PROT 7.1  ALBUMIN 4.3   No results for input(s): "LIPASE", "AMYLASE" in the last 168 hours. No results for input(s): "AMMONIA" in the last 168 hours. CBC: Recent Labs  Lab 03/04/22 1439  WBC 8.9  HGB 11.7*  HCT 35.8*  MCV 100.0  PLT 93*   Cardiac Enzymes: No results for input(s): "CKTOTAL", "CKMB", "CKMBINDEX", "TROPONINI" in the last 168 hours. BNP: Invalid input(s): "POCBNP" CBG: Recent Labs  Lab 03/05/22 1157 03/05/22 1610 03/05/22 2019 03/06/22 0745 03/06/22 1249  GLUCAP 353* 305* 350* 168* 300*   D-Dimer No results for input(s): "DDIMER" in the last 72 hours. Hgb A1c Recent Labs    03/05/22 0926  HGBA1C 5.9*   Lipid Profile No results for input(s): "CHOL", "HDL", "LDLCALC", "TRIG", "CHOLHDL", "LDLDIRECT" in the last 72 hours. Thyroid function studies No results for input(s): "TSH", "T4TOTAL", "T3FREE", "THYROIDAB" in the last 72 hours.  Invalid input(s): "FREET3" Anemia work up No results for input(s): "VITAMINB12", "FOLATE", "FERRITIN", "TIBC", "IRON", "RETICCTPCT" in the last 72 hours. Urinalysis    Component Value Date/Time   COLORURINE YELLOW (A) 03/04/2022 1815   APPEARANCEUR HAZY (A) 03/04/2022 1815   APPEARANCEUR Clear 03/11/2019 1354   LABSPEC 1.008 03/04/2022 1815   PHURINE 7.0 03/04/2022 1815   GLUCOSEU NEGATIVE 03/04/2022 1815   HGBUR LARGE (A) 03/04/2022 1815   BILIRUBINUR NEGATIVE  03/04/2022 1815   BILIRUBINUR Negative 03/11/2019 1354   KETONESUR NEGATIVE 03/04/2022 1815   PROTEINUR NEGATIVE 03/04/2022 1815   UROBILINOGEN 0.2 03/23/2010 1424   NITRITE NEGATIVE 03/04/2022 1815   LEUKOCYTESUR NEGATIVE 03/04/2022 1815   Sepsis Labs Recent Labs  Lab 03/04/22 1439  WBC 8.9   Microbiology Recent Results (from the past 240 hour(s))  SARS Coronavirus 2 by RT PCR (hospital order, performed in [place] hospital lab) *cepheid single result test* Anterior Nasal Swab     Status: Abnormal   Collection Time: 03/04/22  6:15 PM   Specimen: Anterior Nasal Swab  Result Value Ref Range Status   SARS Coronavirus 2 by RT PCR POSITIVE (A) NEGATIVE Final    Comment: (NOTE) SARS-CoV-2 target nucleic acids are DETECTED  SARS-CoV-2 RNA is generally detectable in upper respiratory specimens  during the acute phase of infection.  Positive  results are indicative  of the presence of the identified virus, but do not rule out bacterial infection or co-infection with other pathogens not detected by the test.  Clinical correlation with patient history and  other diagnostic information is necessary to determine patient infection status.  The expected result is negative.  Fact Sheet for Patients:   https://www.patel.info/   Fact Sheet for Healthcare Providers:   https://hall.com/    This test is not yet approved or cleared by the [place] FDA and  has been authorized for detection and/or diagnosis of SARS-CoV-2 by FDA under an Emergency Use Authorization (EUA).  This EUA will remain in effect (meaning this test can be used) for the duration of  the COVID-19 declaration under Section 564(b)(1)  of the Act, 21 U.S.C. section 360-bbb-3(b)(1), unless the authorization is terminated or revoked sooner.   Performed at [facility], [address] [place], [place] [address]      Time coordinating discharge: Over 30  minutes  SIGNED:   Sidney Ace, MD  [facility] 03/06/2022, 1:19 PM Pager   If 7PM-7AM, please contact night-coverage

## 2022-03-06 NOTE — TOC Initial Note (Addendum)
Transition of Care [facility]) - Initial/Assessment Note    Patient Details  Name: Craig Wilson MRN: [id number] Date of Birth: September 06, 1940  Transition of Care [facility]) CM/SW Contact:    Laurena Slimmer, RN Phone Number: 03/06/2022, 11:45 AM  Clinical Narrative:                 Spoke with patient by phone regarding discharge. Patient is refusing HH recommendation for PT/OT due to past experiences. He is not interested in pursing any type outside agency care at this time. Patient is requesting EMS transport for himself and his wife.    3:06pm   EMS arranged. Face sheet and medical necessity forms printed to floor. TOC signing off.    Patient Goals and CMS Choice        Expected Discharge Plan and Services           Expected Discharge Date: 03/06/22                                    Prior Living Arrangements/Services                       Activities of Daily Living Home Assistive Devices/Equipment: Eyeglasses, Kasandra Knudsen (specify quad or straight) ADL Screening (condition at time of admission) Patient's cognitive ability adequate to safely complete daily activities?: Yes Is the patient deaf or have difficulty hearing?: No Does the patient have difficulty seeing, even when wearing glasses/contacts?: No Does the patient have difficulty concentrating, remembering, or making decisions?: No Patient able to express need for assistance with ADLs?: Yes Does the patient have difficulty dressing or bathing?: Yes Independently performs ADLs?: Yes (appropriate for developmental age) Does the patient have difficulty walking or climbing stairs?: Yes Weakness of Legs: Both Weakness of Arms/Hands: None  Permission Sought/Granted                  Emotional Assessment              Admission diagnosis:  Weakness [R53.1] Generalized weakness [R53.1] COVID-19 [U07.1] Patient Active Problem List   Diagnosis Date Noted   Generalized weakness 03/04/2022   COVID-19  virus infection 03/04/2022   Fever 70/26/3785   Acute metabolic encephalopathy 88/50/2774   Frequent falls 06/24/2021   Closed bilateral ankle fractures 06/24/2021   Acute respiratory failure with hypoxia and hypercapnia (HCC)    Altered mental status    Sepsis due to pneumonia ([place]) 11/05/2020   Acute kidney injury ([place]) 10/31/2020   Bladder outlet obstruction 10/31/2020   Hyponatremia 10/31/2020   Acute cystitis without hematuria 10/31/2020   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 04/27/2020   OSA (obstructive sleep apnea) 11/03/2019   RBBB (right bundle branch block) 11/03/2019   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   Other emphysema ([place]) 06/27/2017   Acquired trigger finger 04/10/2017   Elevated coronary artery calcium score 03/04/2017   Sepsis ([place]) 07/01/2016   CAP (community acquired pneumonia) 07/01/2016   Influenza A 07/01/2016   DDD (degenerative disc disease), lumbar 03/11/2016   Senile purpura ([place]) 03/11/2016   Spiradenoma 12/01/2015   Mass of left side of neck 10/19/2015   Thrombocytopenia ([place]) 09/07/2015   Bilateral edema of lower extremity 05/10/2015   Bipolar disorder, in partial remission, most recent episode depressed ([place]) 05/10/2015   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[place]) 05/10/2015   Edema of lower extremity 05/10/2015   RLS (restless legs  syndrome) 03/29/2015   Adenomatous polyp 02/24/2015   Anemia 02/24/2015   Bipolar disorder ([place]) 02/24/2015   Chronic diarrhea 02/24/2015   Diabetic neuropathy ([place]) 02/24/2015   Arthritis, degenerative 02/24/2015   Arthritis urica 02/24/2015   BP (high blood pressure) 02/24/2015   CA of prostate ([place]) 02/24/2015   Incontinence 02/01/2015   History of prostate cancer 02/01/2015   Gross hematuria 02/01/2015   SOB (shortness of breath) 12/16/2014   Pain in the chest 12/16/2014   Family history of premature CAD 12/16/2014   Parkinson's disease ([place]) 12/16/2014   Essential hypertension 12/16/2014    Unsteady gait 12/16/2014   Hyperlipidemia 12/16/2014   Idiopathic Parkinson's disease 11/23/2014   Difficulty in walking 10/10/2014   Difficulty sleeping 10/10/2014   Breath shortness 05/06/2014   Amnesia 02/28/2014   Type 2 diabetes mellitus without complication, without long-term current use of insulin ([place]) 02/15/2014   Has a tremor 02/01/2006   Tremor 02/01/2006   PCP:  Adin Hector, MD Pharmacy:   [id number] [address], [address] [place] [address] Phone: [phone] Fax: [phone]  [facility] [address], [place] [address] Phone: [phone] Fax: [phone]  [facility] ([address], [address] [place] [address] Phone: [phone] Fax: [phone]     Social Determinants of Health (SDOH) Interventions    Readmission Risk Interventions     No data to display

## 2022-03-26 ENCOUNTER — Ambulatory Visit: Payer: Medicare Other | Attending: Cardiovascular Disease

## 2022-03-26 DIAGNOSIS — R0602 Shortness of breath: Secondary | ICD-10-CM

## 2022-03-26 LAB — ECHOCARDIOGRAM COMPLETE
AR max vel: 2.56 cm2
AV Area VTI: 2.5 cm2
AV Area mean vel: 2.63 cm2
AV Mean grad: 4 mmHg
AV Peak grad: 6.7 mmHg
Ao pk vel: 1.29 m/s
Area-P 1/2: 4.19 cm2
S' Lateral: 3.8 cm

## 2022-03-26 MED ORDER — PERFLUTREN LIPID MICROSPHERE
1.0000 mL | INTRAVENOUS | Status: AC | PRN
  Administered 2022-03-26: 2 mL via INTRAVENOUS

## 2022-04-03 ENCOUNTER — Telehealth: Payer: Self-pay

## 2022-04-03 NOTE — Telephone Encounter (Signed)
Attempted to call patient and he has no VM box set up to leave a message.  Minna Merritts, MD  P Cv Div [place] Triage Echocardiogram  Normal left ventricular and right ventricular size and function  No significant valvular heart disease  No indication of elevated heart pressures  Overall looks relatively good

## 2022-04-05 ENCOUNTER — Telehealth: Payer: Self-pay | Admitting: Cardiovascular Disease

## 2022-04-05 ENCOUNTER — Encounter: Payer: Self-pay | Admitting: *Deleted

## 2022-04-05 NOTE — Telephone Encounter (Signed)
Minna Merritts, MD 04/02/2022  8:27 AM EDT     Echocardiogram Normal left ventricular and right ventricular size and function No significant valvular heart disease No indication of elevated heart pressures Overall looks relatively good

## 2022-04-05 NOTE — Telephone Encounter (Signed)
I called and spoke with the patient's wife regarding the patient's echocardiogram results (ok per DPR). She voices understanding of these results, but advised that the patient is SOB with laying down, standing, walking, and no one can seem to figure out why.  I advised Mrs. Hellwig that in reviewing Dr. Donivan Scull note from 02/19/22: SOB (shortness of breath) Chronic shortness of breath, sedentary, likely exacerbated by Parkinson's Echocardiogram ordered, no prior baseline study   Parkinson's disease ([place]) Recommended walking program Followed by neurology High fall risk  I inquired if the patient has seen Dr. Manuella Ghazi back to follow up since his 02/15/22 appointment with them. She advised he has not and has a hard time getting in with them.  I advised her that I will send a fax message to Dr. Trena Platt office with the normal echo results and Dr. Donivan Scull impression that his SOB may be driven by his Parkinson's. I will ask that they reach out to the patient/ his wife to arrange for follow up.  Mrs. Zartman voices understanding and is agreeable. She was appreciative of the call back.

## 2022-04-05 NOTE — Telephone Encounter (Signed)
Attempted to contact pt. No answer, no vm.

## 2022-04-08 NOTE — Telephone Encounter (Signed)
Attempted to reach the patient. No voicemail was available.

## 2022-08-19 NOTE — Progress Notes (Unsigned)
Cardiology Office Note  Date:  08/20/2022   ID:  Esa, Sanderlin [birth year], MRN [id number]  PCP:  Adin Hector, MD   Chief Complaint  Patient presents with   6 month follow up     Patient falling a lot lately.     HPI:  Mr. Craig Wilson is a very pleasant 82 year old gentleman with history of  Parkinson's, History of falls hypertension,  diabetes,  prostate cancer with prior radiation treatment, Incontinence hyperlipidemia  CT coronary calcium score score of 7 Aortic atherosclerosis on CT who presents for evaluation of shortness of breath, coronary artery disease, hypertension.  Last seen in the clinic by myself July 2022 Several hospital admissions since that time  Hospitalized October 2023, COVID, cough congestion sore throat malaise weakness Fell at home after walking without his cane Treated with antivirals, IV fluids Hyperglycemia after steroid use Blood pressure was elevated in the hospital  Uses a cane, legs get twisted No recent falls at home since hospitalization  Lab work reviewed A1C 6.6 Total chol 139, LDL 48  Echo October 2023 EF 60 to 65%, normal RV size and function  Followed by pulmonary:saw Dr. Charmayne Sheer on inhalers (not using them)  EKG personally reviewed by myself on todays visit Nsr rate 68 bpm, rbbb  Other past medical history reviewed hospital diagnosed with acute metabolic encephalopathy July 2023 Presenting with fever, altered mental status, treated with IV fluids and antibiotics Chronic thrombocytopenia In the hospital 3 days  In the hospital January 2023, closed nondisplaced fracture right distal fibula as well as right second and third metatarsals Frequent falls at home.  Was discharged home, readmitted 10 days later after repeat falls Was sent to rehab for recovery, [facility] Lives in wheelchair for one month  In hospital,  may and June 2022:  Septic shock secondary to pneumonia Acute hypoxic and  hypercapnic respiratory failure due to pneumonia sepsis Acute metabolic encephalopathy due to sepsis Patient presented with tachypnea, somnolence, hypoxic respiratory failure in the setting of pneumonia with septic shock.   admitted to ICU, intubated, placed on Levophed and antibiotics.   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   sphincter had been activated 5/25, deactivated 5/31 due to AKI and urinary obstruction.  During this hospitalization, the patient had a Foley catheter for several days  Creatinine resolved to normal, new AKI ruled out.   PNA, Spent 3 weeks at rehab   At the time of discharge ,off losartan after weight loss in hospital  Acute on Chronic SOB, Seen by pulmonary, on Symbicort  Previous CT coronary calcium  score of 7  Denies any leg swelling Prior history of chronic diarrhea   strong family history. Father had MI in his 30s, mother had stroke in her 34s Brother has cancer    PMH:   has a past medical history of Anemia, Bipolar disorder ([place]), Chronic diarrhea, Colon polyps, Depression, Dermatophytosis, Diabetic neuropathy ([place]), DJD (degenerative joint disease), Dyspnea, Gout, Gross hematuria, HLD (hyperlipidemia), HTN (hypertension), Hyperkeratosis, Onychomycosis, Parkinson disease, Prostate cancer ([place]), Sleep apnea, Tremor, and Type 2 diabetes mellitus ([place]).  PSH:    Past Surgical History:  Procedure Laterality Date   cataract surgery Bilateral    COLONOSCOPY W/ BIOPSIES     FOOT SURGERY  PROSTATE SURGERY     removed   status post implantation of artificial urinary sphincter      TONSILLECTOMY      Current Outpatient Medications  Medication Sig Dispense Refill   albuterol (VENTOLIN HFA) 108 (90  Base) MCG/ACT inhaler Inhale 2 puffs into the lungs every 6 (six) hours as needed for wheezing or shortness of breath. 8 g 0   aspirin EC 81 MG tablet Take 81 mg by mouth daily. Swallow whole.     atorvastatin (LIPITOR) 40 MG tablet Take 1 tablet (40 mg total) by mouth every evening. 90 tablet 3   benzonatate (TESSALON PERLES) 100 MG capsule Take 1 capsule (100 mg total) by mouth 3 (three) times daily as needed for cough. 30 capsule 0   carbidopa-levodopa (SINEMET IR) 25-100 MG tablet Take 2 tablets by mouth 2 (two) times daily.     guaiFENesin-dextromethorphan (ROBITUSSIN DM) 100-10 MG/5ML syrup Take 5 mLs by mouth every 4 (four) hours as needed for cough. 118 mL 0   lithium carbonate 300 MG capsule Take 300 mg by mouth 2 (two) times daily.     loperamide (IMODIUM) 2 MG capsule Take 1 capsule (2 mg total) by mouth as needed for diarrhea or loose stools. 30 capsule 0   nystatin cream (MYCOSTATIN) Apply 1 Application topically 2 (two) times daily as needed.     rOPINIRole (REQUIP) 2 MG tablet Take 2 mg by mouth at bedtime.     traZODone (DESYREL) 50 MG tablet Take 50-100 mg by mouth at bedtime.     No current facility-administered medications for this visit.    Allergies:   Carbidopa w-levodopa, Carbidopa-levodopa, Latex, and Solifenacin   Social History:  The patient  reports that he quit smoking about 22 years ago. His smoking use included cigarettes. He has never used smokeless tobacco. He reports that he does not drink alcohol and does not use drugs.   Family History:   family history includes Heart attack (age of onset: 38) in his father; Heart disease in his mother; Hyperlipidemia in his father; Hypertension in his father.    Review of Systems: Review of Systems  Constitutional: Negative.   HENT: Negative.    Respiratory:  Positive for shortness of breath.   Cardiovascular: Negative.   Gastrointestinal: Negative.   Musculoskeletal:  Positive for falls.       Unsteady gait   Neurological: Negative.   Psychiatric/Behavioral: Negative.    All other systems reviewed and are negative.    PHYSICAL EXAM: VS:  BP (!) 130/58 (BP Location: Left Arm)   Pulse 68   Ht 5\' 7"  (1.702 m)   Wt 154 lb 9.6 oz (70.1 kg)   BMI 24.21 kg/m  , BMI Body mass index is 24.21 kg/m. Constitutional:  oriented to person, place, and time. No distress.  HENT:  Head: Grossly normal Eyes:  no discharge. No scleral icterus.  Neck: No JVD, no carotid bruits  Cardiovascular: Regular rate and rhythm, no murmurs appreciated Pulmonary/Chest: Clear to auscultation bilaterally, no wheezes or rails Abdominal: Soft.  no distension.  no tenderness.  Musculoskeletal: Normal range of motion Neurological:  normal muscle tone. Coordination normal. No atrophy Skin: Skin warm and dry Psychiatric: normal affect, pleasant  Recent Labs: 12/09/2021: TSH 1.044 03/04/2022: ALT 18; B Natriuretic Peptide 160.3; BUN 14; Creatinine, Ser 0.95; Hemoglobin 11.7; Platelets 93; Potassium 4.6; Sodium 139    Lipid Panel Lab Results  Component Value Date   TRIG 104 11/06/2020  Wt Readings from Last 3 Encounters:  08/20/22 154 lb 9.6 oz (70.1 kg)  03/04/22 159 lb 6.3 oz (72.3 kg)  02/19/22 164 lb 2 oz (74.4 kg)     ASSESSMENT AND PLAN:  Essential hypertension  Blood pressure is well controlled on today's visit. No changes made to the medications.  Mixed hyperlipidemia Minimal underlying coronary calcifications seen on CT scan Cholesterol is at goal on the current lipid regimen.  No changes made to medications, no further testing  SOB (shortness of breath) Chronic shortness of breath, likely exacerbated by Parkinson's, deconditioning Echo with normal ejection fraction, no significant valvular heart disease  Parkinson's disease (HCC) Sedentary baseline, fall risk Recommended walking program Followed by neurology  Type 2 diabetes mellitus with complication, without long-term current use of  insulin (HCC) Hemoglobin A1c stable A1c above 6, weight stable  Anemia, unspecified type Stable, HGB 10.4   Total encounter time more than 30 minutes  Greater than 50% was spent in counseling and coordination of care with the patient    Orders Placed This Encounter  Procedures   EKG 12-Lead      Signed, Esmond Plants, M.D., Ph.D. 08/20/2022  [place], [place]

## 2022-08-20 ENCOUNTER — Ambulatory Visit: Payer: Medicare Other | Attending: Cardiovascular Disease | Admitting: Cardiovascular Disease

## 2022-08-20 ENCOUNTER — Encounter: Payer: Self-pay | Admitting: Cardiovascular Disease

## 2022-08-20 VITALS — BP 130/58 | HR 68 | Ht 67.0 in | Wt 154.6 lb

## 2022-08-20 DIAGNOSIS — G20A1 Parkinson's disease without dyskinesia, without mention of fluctuations: Secondary | ICD-10-CM

## 2022-08-20 DIAGNOSIS — I10 Essential (primary) hypertension: Secondary | ICD-10-CM

## 2022-08-20 DIAGNOSIS — R931 Abnormal findings on diagnostic imaging of heart and coronary circulation: Secondary | ICD-10-CM | POA: Diagnosis not present

## 2022-08-20 DIAGNOSIS — R0602 Shortness of breath: Secondary | ICD-10-CM | POA: Diagnosis not present

## 2022-08-20 DIAGNOSIS — E782 Mixed hyperlipidemia: Secondary | ICD-10-CM

## 2022-08-20 DIAGNOSIS — E118 Type 2 diabetes mellitus with unspecified complications: Secondary | ICD-10-CM

## 2022-08-20 MED ORDER — ATORVASTATIN CALCIUM 40 MG PO TABS: 40.0000 mg | ORAL_TABLET | Freq: Every evening | ORAL | 3 refills | Status: DC

## 2022-08-20 NOTE — Patient Instructions (Signed)
Medication Instructions:  No changes  If you need a refill on your cardiac medications before your next appointment, please call your pharmacy.   Lab work: No new labs needed  Testing/Procedures: No new testing needed  Follow-Up: At [facility], you and your health needs are our priority.  As part of our continuing mission to provide you with exceptional heart care, we have created designated Provider Care Teams.  These Care Teams include your primary Cardiologist (physician) and Advanced Practice Providers (APPs -  Physician Assistants and Nurse Practitioners) who all work together to provide you with the care you need, when you need it.  You will need a follow up appointment in 12 months  Providers on your designated Care Team:   Christopher Berge, NP Ryan Dunn, PA-C Cadence Furth, PA-C  COVID-19 Vaccine Information can be found at: [email or web address] For questions related to vaccine distribution or appointments, please email [email or web address] or call [phone].   

## 2023-02-27 ENCOUNTER — Other Ambulatory Visit: Payer: Self-pay | Admitting: Cardiovascular Disease

## 2023-04-30 IMAGING — CR DG FOOT COMPLETE 3+V*R*
3 series · 3 of 3 positions shown · non-contrast
Comparison: Right foot x-ray 06/14/2021

CLINICAL DATA: Right foot injury.  History of recent fracture.

EXAM:
RIGHT FOOT COMPLETE - 3+ VIEW

[foot ap]
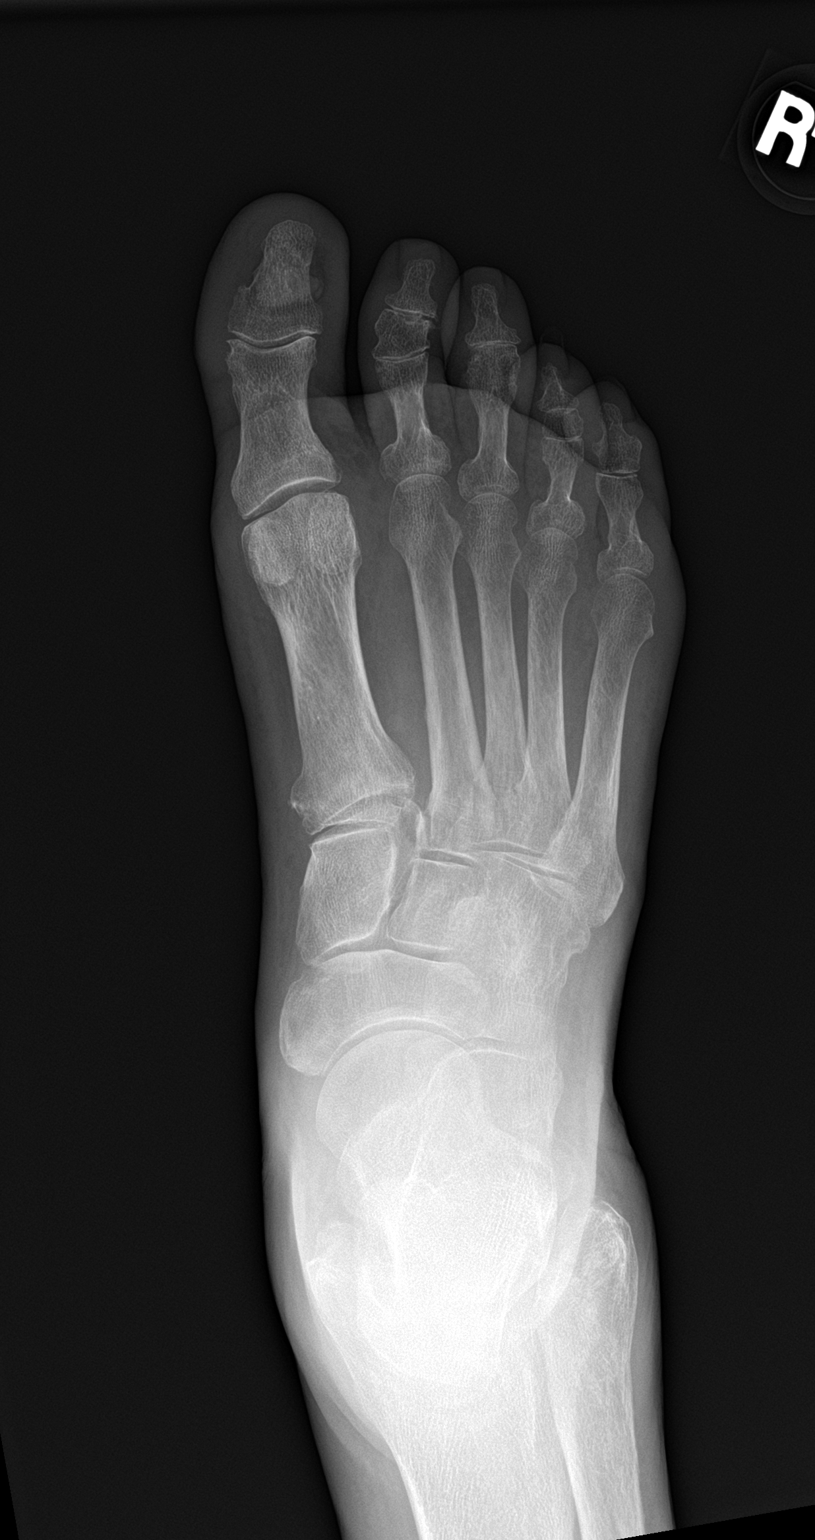

[foot obl]
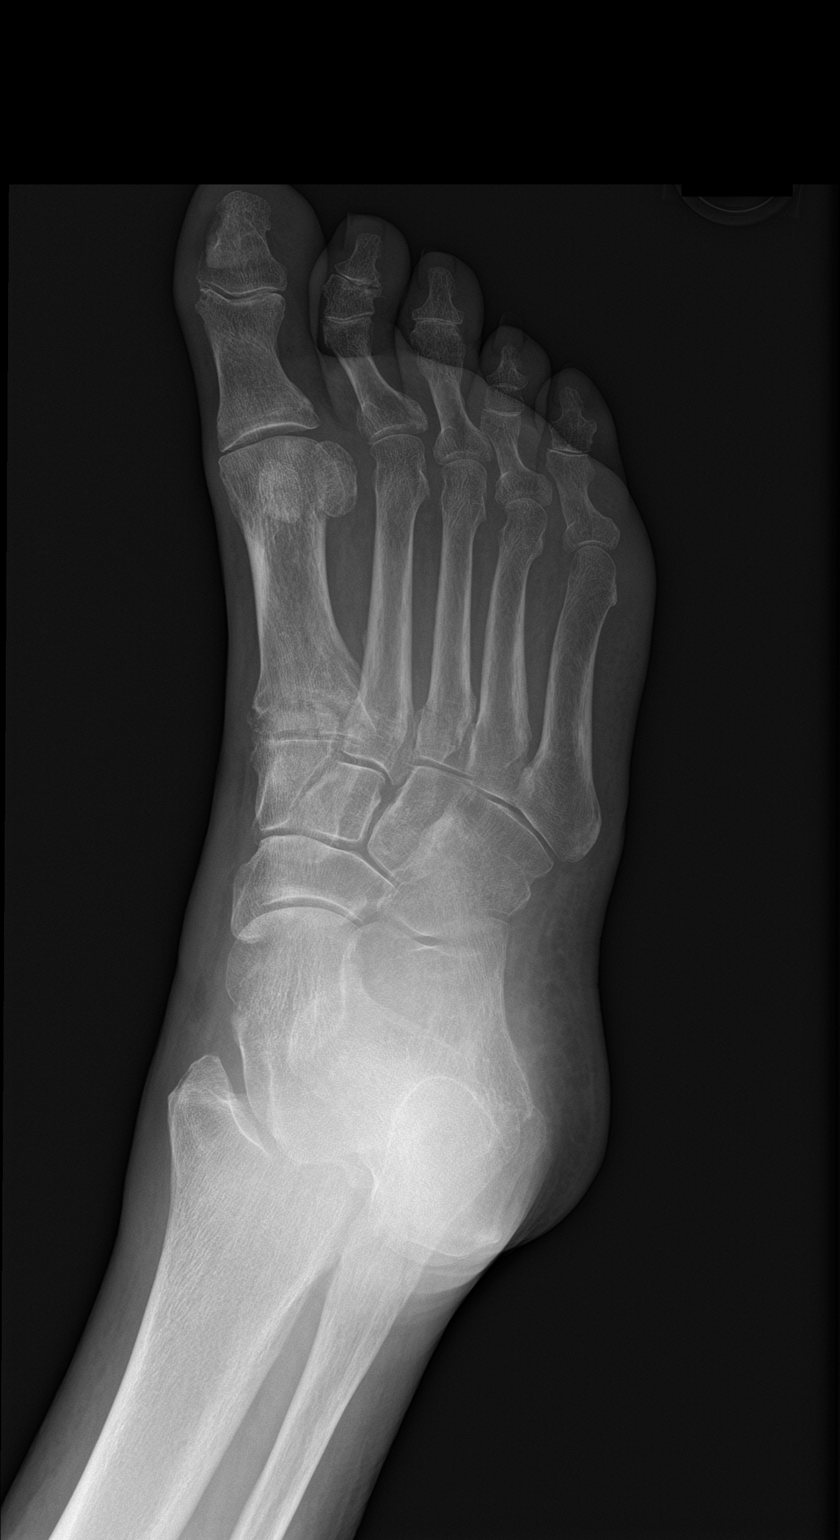

[foot lat]
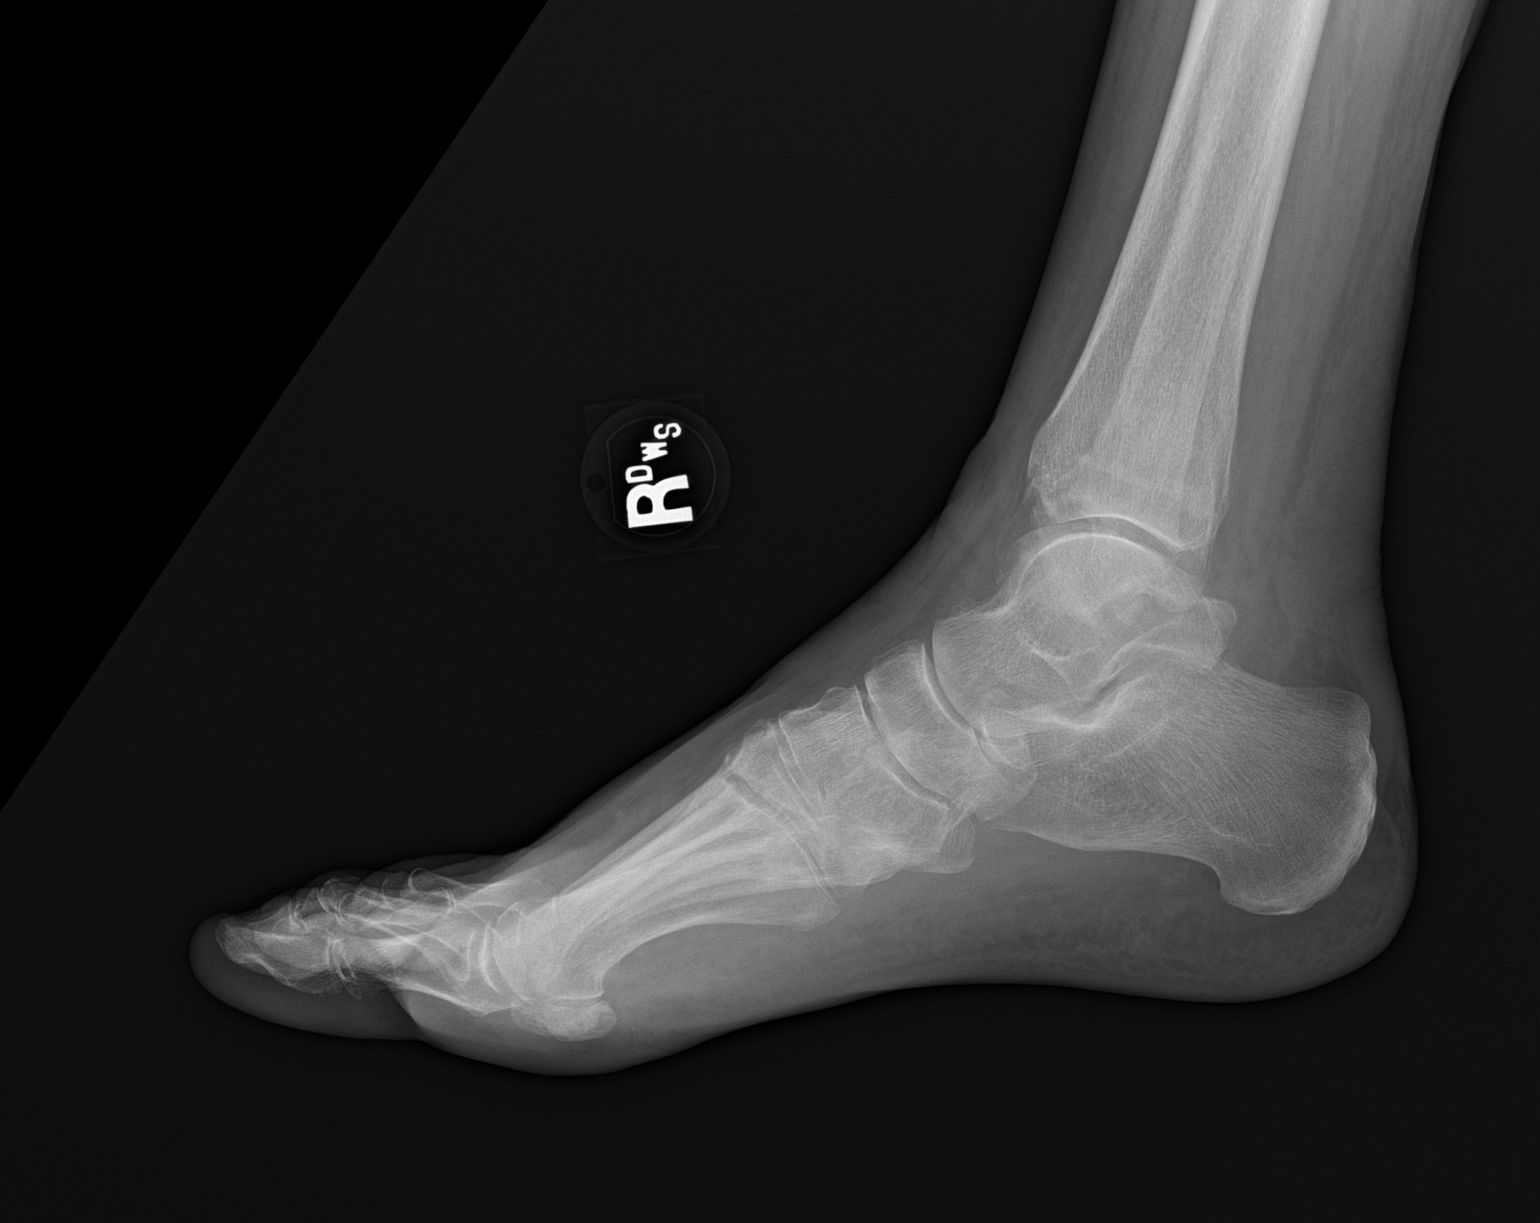

[3 of 3 positions shown; findings below may reference images not displayed]

FINDINGS: Stable appearing recent nondisplaced fractures at the bases of the
second and third metatarsals. No new displacement visualized. No new
fracture or dislocation identified in the foot.
IMPRESSION: Stable appearing recent nondisplaced fractures at the bases of the
second and third metatarsals.

## 2023-12-14 ENCOUNTER — Emergency Department
Admission: EM | Admit: 2023-12-14 | Discharge: 2023-12-14 | Disposition: A | Discharge status: Home / Self Care | Attending: Emergency Medicine | Admitting: Emergency Medicine

## 2023-12-14 ENCOUNTER — Other Ambulatory Visit: Payer: Self-pay

## 2023-12-14 DIAGNOSIS — N35919 Unspecified urethral stricture, male, unspecified site: Secondary | ICD-10-CM

## 2023-12-14 DIAGNOSIS — R339 Retention of urine, unspecified: Secondary | ICD-10-CM | POA: Insufficient documentation

## 2023-12-14 DIAGNOSIS — R32 Unspecified urinary incontinence: Secondary | ICD-10-CM | POA: Insufficient documentation

## 2023-12-14 DIAGNOSIS — N35911 Unspecified urethral stricture, male, meatal: Secondary | ICD-10-CM

## 2023-12-14 DIAGNOSIS — Z960 Presence of urogenital implants: Secondary | ICD-10-CM | POA: Diagnosis not present

## 2023-12-14 LAB — COMPREHENSIVE METABOLIC PANEL WITH GFR
ALT: 5 U/L (ref 0–44)
AST: 9 U/L — ABNORMAL LOW (ref 15–41)
Albumin: 4 g/dL (ref 3.5–5.0)
Alkaline Phosphatase: 62 U/L (ref 38–126)
Anion gap: 8 (ref 5–15)
BUN: 22 mg/dL (ref 8–23)
CO2: 25 mmol/L (ref 22–32)
Calcium: 10 mg/dL (ref 8.9–10.3)
Chloride: 104 mmol/L (ref 98–111)
Creatinine, Ser: 0.91 mg/dL (ref 0.61–1.24)
GFR, Estimated: >60 mL/min (ref >60)
Glucose, Bld: 141 mg/dL — ABNORMAL HIGH (ref 70–99)
Potassium: 4.2 mmol/L (ref 3.5–5.1)
Sodium: 137 mmol/L (ref 135–145)
Total Bilirubin: 0.8 mg/dL (ref 0.0–1.2)
Total Protein: 6.2 g/dL — ABNORMAL LOW (ref 6.5–8.1)

## 2023-12-14 LAB — CBC WITH DIFFERENTIAL/PLATELET
Abs Immature Granulocytes: 0.03 K/uL (ref 0.00–0.07)
Basophils Absolute: 0 K/uL (ref 0.0–0.1)
Basophils Relative: 1 %
Eosinophils Absolute: 0.1 K/uL (ref 0.0–0.5)
Eosinophils Relative: 2 %
HCT: 33.4 % — ABNORMAL LOW (ref 39.0–52.0)
Hemoglobin: 11.3 g/dL — ABNORMAL LOW (ref 13.0–17.0)
Immature Granulocytes: 1 %
Lymphocytes Relative: 11 %
Lymphs Abs: 0.7 K/uL (ref 0.7–4.0)
MCH: 33.3 pg (ref 26.0–34.0)
MCHC: 33.8 g/dL (ref 30.0–36.0)
MCV: 98.5 fL (ref 80.0–100.0)
Monocytes Absolute: 0.2 K/uL (ref 0.1–1.0)
Monocytes Relative: 4 %
Neutro Abs: 4.9 K/uL (ref 1.7–7.7)
Neutrophils Relative %: 81 %
Platelets: 101 K/uL — ABNORMAL LOW (ref 150–400)
RBC: 3.39 MIL/uL — ABNORMAL LOW (ref 4.22–5.81)
RDW: 11.9 % (ref 11.5–15.5)
WBC: 6 K/uL (ref 4.0–10.5)
nRBC: 0 % (ref 0.0–0.2)

## 2023-12-14 LAB — URINALYSIS, ROUTINE W REFLEX MICROSCOPIC
Bilirubin Urine: NEGATIVE
Glucose, UA: NEGATIVE mg/dL
Ketones, ur: NEGATIVE mg/dL
Leukocytes,Ua: NEGATIVE
Nitrite: NEGATIVE
Protein, ur: 30 mg/dL — AB
RBC / HPF: >50 RBC/hpf (ref 0–5)
Specific Gravity, Urine: 1.01 (ref 1.005–1.030)
pH: 6 (ref 5.0–8.0)

## 2023-12-14 MED ORDER — CEPHALEXIN 500 MG PO CAPS: 500.0000 mg | ORAL_CAPSULE | Freq: Two times a day (BID) | ORAL | 0 refills | Status: AC

## 2023-12-14 MED ORDER — FENTANYL CITRATE PF 50 MCG/ML IJ SOSY
50.0000 ug | PREFILLED_SYRINGE | Freq: Once | INTRAMUSCULAR | Status: AC
  Administered 2023-12-14: 50 ug via INTRAVENOUS
  Filled 2023-12-14: qty 1

## 2023-12-14 MED ORDER — ONDANSETRON HCL 4 MG/2ML IJ SOLN
4.0000 mg | Freq: Once | INTRAMUSCULAR | Status: AC
  Administered 2023-12-14: 4 mg via INTRAVENOUS
  Filled 2023-12-14: qty 2

## 2023-12-14 MED ORDER — HYDROMORPHONE HCL 1 MG/ML IJ SOLN: 0.5000 mg | Freq: Once | INTRAMUSCULAR | Status: DC

## 2023-12-14 MED ORDER — SODIUM CHLORIDE 0.9 % IV SOLN
2.0000 g | Freq: Once | INTRAVENOUS | Status: AC
  Administered 2023-12-14: 2 g via INTRAVENOUS
  Filled 2023-12-14: qty 20

## 2023-12-14 NOTE — ED Provider Notes (Signed)
 [facility] Provider Note    Event Date/Time   First MD Initiated Contact with Patient 12/14/23 [phone]     (approximate)   History   Urinary Retention   HPI  Craig Wilson is a 83 y.o. male who comes in with concerns for urinary retention with last using the bathroom around 2100.  Pt reports he had his prostate removed years ago.  He states that when he got really sick that somebody tried to place a catheter in him without opening up his sphincter and since then he has had urinary incontinence and he does not use the sphincter to urinate.  He states that he had 1 episode where he could not urinate years ago after having his prostate removed that he saw urology for but states he does not have a current issue with that that typically has urinary incontinence.  He states that he has not peed since last night but he has not tried to open the sphincter.  He denies any other new symptoms.  I reviewed a CT scan from 2022 Reproductive: The patient is status post prostatectomy. An artificial urinary sphincter is redemonstrated.   Physical Exam   Triage Vital Signs: ED Triage Vitals [12/14/23 0744]  Encounter Vitals Group     BP (!) 164/72     Girls Systolic BP Percentile      Girls Diastolic BP Percentile      Boys Systolic BP Percentile      Boys Diastolic BP Percentile      Pulse Rate 69     Resp 18     Temp 98.4 F (36.9 C)     Temp Source Oral     SpO2 99 %     Weight      Height      Head Circumference      Peak Flow      Pain Score 9     Pain Loc      Pain Education      Exclude from Growth Chart     Most recent vital signs: Vitals:   12/14/23 0744  BP: (!) 164/72  Pulse: 69  Resp: 18  Temp: 98.4 F (36.9 C)  SpO2: 99%     General: Awake, no distress.  CV:  Good peripheral perfusion.  Resp:  Normal effort.  Abd:  No distention.  Soft and nontender some fullness noted in the bladder area Other:   Patient is ambulatory around  the room.  ED Results / Procedures / Treatments   Labs (all labs ordered are listed, but only abnormal results are displayed) Labs Reviewed  CBC WITH DIFFERENTIAL/PLATELET  COMPREHENSIVE METABOLIC PANEL WITH GFR  URINALYSIS, ROUTINE W REFLEX MICROSCOPIC     PROCEDURES:  Critical Care performed: No  Procedures   MEDICATIONS ORDERED IN ED: Medications  cefTRIAXone  (ROCEPHIN ) 2 g in sodium chloride  0.9 % 100 mL IVPB (has no administration in time range)  ondansetron  (ZOFRAN ) injection 4 mg (4 mg Intravenous Given 12/14/23 1006)  fentaNYL  (SUBLIMAZE ) injection 50 mcg (50 mcg Intravenous Given 12/14/23 1005)  fentaNYL  (SUBLIMAZE ) injection 50 mcg (50 mcg Intravenous Given 12/14/23 1108)     IMPRESSION / MDM / ASSESSMENT AND PLAN / ED COURSE  I reviewed the triage vital signs and the nursing notes.   Patient's presentation is most consistent with acute presentation with potential threat to life or bodily function.   Patient comes in with concern for urinary retention.  Patient does have urinary  sphincter although he states that has not been functionable for many years now however I did have patient push the button to see if he fully opened it if he can get the urine out.  Unfortunately this was not successful therefore bladder scan was done that shows retention and Foley catheter was placed with patient holding the sphincter fully open.  Basic labs were ordered evaluate for AKI, electrolyte abnormalities.  Urine to evaluate for UTI.  9:38 AM with patient attempting to open the sphincter unable to place any type of Foley or 12 coud therefore discussed with Dr. Francisca who will come and evaluate patient.  Was acute patient given some pain medicine given it would be about an hour before he got here.  Patient given 50 of fentanyl  4 of Zofran .  11:30 AM urology is currently in the room they are having difficulty getting the Foley in as well they are going to take a camera and take a look  and try to figure out what is going on.  Patient was able to place a Foley.  He did want me to do a dose of IV ceftriaxone  given how long it took in the manipulation.  Did want to send him home on some oral antibiotics such as Keflex .  Reevaluated patient he reports resolution of symptoms urine is flowing well.  He feels comfortable with the above plan and will be discharged after ceftriaxone     FINAL CLINICAL IMPRESSION(S) / ED DIAGNOSES   Final diagnoses:  Urinary retention     Rx / DC Orders   ED Discharge Orders     None        Note:  This document was prepared using Dragon voice recognition software and may include unintentional dictation errors.   Ernest Ronal BRAVO, MD 12/14/23 1250

## 2023-12-14 NOTE — Consult Note (Addendum)
 Urology Consult   I have been asked to see the patient by Dr. Ernest, for evaluation and management of urinary retention, history of prostate cancer, history of artificial urinary sphincter.  Chief Complaint: Urinary retention  HPI:  Craig Wilson is a 83 y.o. male with distant history of prostate cancer treated with prostatectomy as well as external beam radiation in the remote past.  He had initial artificial urinary sphincter placed in 2006, then removal and replacement in February 2022 by Dr. Andra at [facility].  He apparently had problems were a catheter was placed without deactivating the device postoperatively.  Apparently he has had the device deactivated since then secondary to reported malfunction, and he is incontinent at baseline.  He does not cycle or manipulate the device.  He also reportedly has a history of a bladder neck contracture in 2021.  He has been unable to urinate since last night with lower abdominal pain and discomfort, bladder scan 350 mL in the ER and they were unable to place a Foley.  Urology was consulted.  He denies any dysuria or gross hematuria, no recurrent UTIs.  PMH: Past Medical History:  Diagnosis Date   Anemia    Bipolar disorder (HCC)    Chronic diarrhea    Colon polyps    Depression    Dermatophytosis    Diabetic neuropathy (HCC)    DJD (degenerative joint disease)    Dyspnea    Gout    Gross hematuria    HLD (hyperlipidemia)    HTN (hypertension)    Hyperkeratosis    Onychomycosis    Parkinson disease    Prostate cancer (HCC)    Sleep apnea    Tremor    Type 2 diabetes mellitus (HCC)     Surgical History: Past Surgical History:  Procedure Laterality Date   cataract surgery Bilateral    COLONOSCOPY W/ BIOPSIES     FOOT SURGERY     PROSTATE SURGERY     removed   status post implantation of artificial urinary sphincter      TONSILLECTOMY      Allergies:  Allergies  Allergen Reactions   Carbidopa -Levodopa  Other (See  Comments)    irrational behavior , felt zombie like   Carbidopa -Levodopa  Other (See Comments)    irrational behavior , felt zombie like   Latex Other (See Comments)    Internal such as catheters. Latex gloves are okay   Solifenacin Other (See Comments)    Family History: Family History  Problem Relation Age of Onset   Heart disease Mother    Heart attack Father 72   Hypertension Father    Hyperlipidemia Father    Kidney cancer Neg Hx    Prostate cancer Neg Hx    Bladder Cancer Neg Hx     Social History:  reports that he quit smoking about 24 years ago. His smoking use included cigarettes. He has never used smokeless tobacco. He reports that he does not drink alcohol  and does not use drugs.  ROS: Negative aside from those stated in the HPI.  Physical Exam: BP (!) 192/71   Pulse 69   Temp 98.6 F (37 C) (Oral)   Resp 20   SpO2 100%    Constitutional:  Alert and oriented, No acute distress. Cardiovascular: No clubbing, cyanosis, or edema. Respiratory: Normal respiratory effort, no increased work of breathing. GI: Abdomen is soft, nontender, nondistended, no abdominal masses GU: Circumcised phallus with patent meatus, sphincter pump in right hemiscrotum  Laboratory Data: Reviewed in epic, normal renal function  Assessment & Plan:   83 year old male with distant history of prostate cancer and radiation, artificial urinary sphincter that was replaced in 2022 by Dr. Andra at [facility], but reportedly has been deactivated and dysfunctional after having a catheter placed at some point shortly postoperatively without deactivating the sphincter.  He is incontinent at baseline.  Developed retention overnight, unable to urinate with elevated PVR and lower abdominal discomfort.  ER unable to place Foley.  On exam, I confirmed the device was deactivated.  I attempted a 84 and 14 French silicone Foley but met resistance in the proximal urethra.  See procedure note for details of  catheter placement.  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  Recommendations:  -Okay for discharge from urology perspective, would recommend 3 days of antibiotics with manipulation and retention - Will reach out to [facility] urology for follow-up, ideally catheter should not remain in place for more than 4 to 5 days  Redell JAYSON Burnet, MD  Total time spent on the floor was 70 minutes, with greater than 50% spent in counseling and coordination of care with the patient regarding artificial urinary sphincter, bladder neck contracture, complex catheter placement, need for close follow-up.  [facility] [address], [address] [place], [place] [address] [phone]

## 2023-12-14 NOTE — Discharge Instructions (Signed)
 Follow-up with your urologist return to the ER if you develop issues with the catheter not flowing or any other concerns.  We have started you on some antibiotics to help prevent infection.  Return to the ER if develop fevers, worsening symptoms or any other concerns

## 2023-12-14 NOTE — ED Notes (Signed)
 Urology at bedside.

## 2023-12-14 NOTE — ED Notes (Signed)
 This RN attempted to place 54fr foley without success. Urology cart called

## 2023-12-14 NOTE — ED Triage Notes (Signed)
 Pt here with urinary retention for a few days, pt states a hx of same. Pt states he is normally incontinent, pt last went to the bathroom at 2100. Pt ambulatory to tx room.

## 2023-12-14 NOTE — ED Notes (Signed)
 This RN attempted to use 60fr coude foley without success. Dr Ernest to be informed

## 2023-12-14 NOTE — Procedures (Signed)
 Procedure: flexible cysto bedside    UROLOGY PROCEDURE NOTE  Indication: Urinary retention, artificial urinary sphincter  The patient was prepped and draped in standard sterile fashion.  Artificial urinary sphincter was confirmed deactivated.  I attempted to pass a 14 and 12 [place] Foley but met resistance in the proximal urethra.  A flexible cystoscope was used to intubate the urethra and the cuff appeared open, however there was a 5 French stricture proximal to the cuff.  A Super Stiff wire was passed through the stricture into the bladder.  A 5 French access catheter was advanced over the wire and the wire removed and drip of clear yellow urine was noted.  Serial dilation was performed from 10 Jamaica to 18 Jamaica using disposable Hayman dilators.  A 16 [place] council Foley passed easily over the wire into the bladder with return of clear yellow urine, 10 mL replaced in the balloon.   Plan: -3 days antibiotics -Continue Foley to drainage -Will need close follow-up this week with his [facility] urologist  Redell Burnet, MD 12/14/2023            [facility] [address] [place], [place] [address] [phone]

## 2023-12-15 ENCOUNTER — Telehealth: Payer: Self-pay | Admitting: Urology

## 2023-12-15 LAB — URINE CULTURE: Culture: NO GROWTH

## 2023-12-16 ENCOUNTER — Other Ambulatory Visit: Payer: Self-pay

## 2023-12-16 ENCOUNTER — Emergency Department
Admission: EM | Admit: 2023-12-16 | Discharge: 2023-12-16 | Discharge status: Against Medical Advice | Attending: Emergency Medicine | Admitting: Emergency Medicine

## 2023-12-16 DIAGNOSIS — Z5321 Procedure and treatment not carried out due to patient leaving prior to being seen by health care provider: Secondary | ICD-10-CM | POA: Diagnosis not present

## 2023-12-16 DIAGNOSIS — Z466 Encounter for fitting and adjustment of urinary device: Secondary | ICD-10-CM | POA: Diagnosis present

## 2023-12-16 NOTE — ED Triage Notes (Signed)
 Pt reports he was trying to hook the tubing from his catheter up to a drainage bag and was unable to get it connected. Pt has no other complaints.

## 2023-12-29 NOTE — Progress Notes (Signed)
 New Patient Consultation   Referring Provider: Self PCP: Fernande Ophelia Marinell DOUGLAS, MD  Chief Complaint: urinary retention   History of Present Illness Craig Wilson is an 83 year old male with a history of artificial sphincter placement who presents following urinary retention s/p urethral stricture dilation with catheter placement.   Current AUS placed 07/10/20. Shortly after activation, he experienced a severe infection that led to hospitalization. During this time, his device was deactivated and a catheter was placed. He was seen by Dr. Gaston outpatient following his hospitalization. His device was unable to be reactivated and patient was recommended to follow up with Dr. Andra, but has not been seen since that time.   Approximately two weeks ago, he was unable to empty his bladder, leading to an ER visit at [facility]. On cystoscopy patient was noted to have a 5Fr stricture proximal to his AUS cuff which was dilated to 18Fr. He then had a 16Fr catheter placed which has been in place since.   Patient denies any bothersome symptoms at this time including fevers, chills, or blood in the urine. Patient is uncertain whether his cuff is functional at this point or not.    GU problem list: 1.SUI -AUS 2006 -AUS remove replace 07/10/20 with 4.0 cuff, activated 10/25/20 -Admitted for sepsis 10/31/20 and cuff deactivated. Never reactivated following discharge or outside followup.  2. Prostate Cancer -s/p RRP and XRT  3. 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 Adenomatous polyposis 2007   of colon  . Anemia    found to have b12 deficiency and low IgG.  Consultation with Dr. Marina arranged.  . Aortic atherosclerosis (CMS-HCC) 07/05/2020   By CT 1/22  . B12  deficiency   . Bipolar disorder (CMS/HHS-HCC)   . Chronic diarrhea   . Colon polyps    on colonoscopy 1997.  GI consultation 04/2005.  SABRA COPD (chronic obstructive pulmonary disease) (CMS/HHS-HCC)   . Depression   . Dermatophytosis of nail   . Diabetes mellitus type 2, uncomplicated (CMS/HHS-HCC) 2014  . Diabetic neuropathy (CMS/HHS-HCC)   . DJD (degenerative joint disease)   . Gout, joint   . Hyperkeratosis   . Hyperlipidemia   . Hypertension   . Onychomycosis   . Parkinson disease (CMS/HHS-HCC)   . Prostate cancer (CMS/HHS-HCC)    history of radical prostatectomy by Dr. Twylla, status post radiation of the prostate by Dr. Lenn. Patient with chronic incontinence, status-post artificial sphincter implantation at [facility] in 2/06.  SABRA RLS (restless legs syndrome)   . Tremor 02/2006   resolved prior to Neurology appointment  . Type II or unspecified type diabetes mellitus with neurological manifestations, not stated as uncontrolled(250.60) (CMS/HHS-HCC) 02/15/2014  . Urinary incontinence     Past Surgical History: Patient  Past Surgical History:  Procedure Laterality Date  . PROSTATE SURGERY  2005   Prostatectomy for prostate cancer.  . AUS implant  2006   Dr Flavia  . COLONOSCOPY  11/01/2011   11/21/1995, 06/07/2005. Adenomatous polyp.  No repeat per RTE.  . EGD  11/01/2011   no repeat per RTE  . REMOVAL & REPLACEMENT INFLATABLE URINARY SPHINCTER PROSTHESIS N/A 07/10/2020   Procedure: REMOVAL AND REPLACEMENT OF INFLATABLE URETHRAL/BLADDER NECK SPHINCTER INCLUDING PUMP, RESERVOIR, AND CUFF AT THE SAME OPERATIVE SESSION;  Surgeon:  Andra Prentice Dunnings, MD;  Location: [facility] OR;  Service: Urology;  Laterality: N/A;  . CYSTOURETHROSCOPY N/A 07/10/2020   Procedure: CYSTOURETHROSCOPY;  Surgeon: Andra Prentice Dunnings, MD;  Location: [facility] OR;  Service: Urology;  Laterality: N/A;  . cataracts Bilateral   . FOOT SURGERY    . TONSILLECTOMY       Medications: Patient  Current  Outpatient Medications  Medication Sig Dispense Refill  . aspirin  81 MG EC tablet Take by mouth    . atorvastatin  (LIPITOR) 40 MG tablet TAKE 1 TABLET BY MOUTH AT  BEDTIME 90 tablet 3  . carbidopa -levodopa  (SINEMET ) 25-100 mg tablet Take 2 tablets by mouth at bedtime 180 tablet 3  . colchicine  0.6 mg Cap Take 0.6 mg by mouth once daily as needed (gout; may repeat in 1 hr if symptoms not resolved, up to 3 capsules in 24 hrs.). 90 capsule 4  . ipratropium (ATROVENT) 21 mcg (0.03 %) nasal spray Place 2 sprays into both nostrils 2 (two) times daily 30 mL 11  . ketoconazole (NIZORAL) 2 % cream Apply topically once daily 30 g 11  . lithium  carbonate 300 MG capsule TAKE 1 CAPSULE BY MOUTH TWICE  DAILY WITH A MEAL 180 capsule 0  . loperamide  (IMODIUM ) 2 mg capsule Take by mouth    . rOPINIRole  (REQUIP ) 2 MG immediate release tablet TAKE 1 TABLET BY MOUTH EVERY DAY NIGHTLY 90 tablet 3  . traZODone  (DESYREL ) 50 MG tablet TAKE 1 TO 2 TABLETS BY MOUTH AT  BEDTIME 180 tablet 3  . omeprazole (PRILOSEC) 40 MG DR capsule TAKE 1 CAPSULE DAILY (Patient not taking: Reported on 12/29/2023) 90 capsule 3   No current facility-administered medications for this visit.    Allergies: Patient Latex and Vesicare [solifenacin]   Social History: Patient  Social History   Socioeconomic History  . Marital status: Married    Spouse name: Craig Wilson   Occupational History  . 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 Smoking status: Former    Current packs/day: 0.00    Average packs/day: 1 pack/day for 30.0 years (30.0 ttl pk-yrs)    Types: Cigarettes    Start date: 11/05/1973    Quit date: 11/06/2003    Years since quitting: 20.1  . Smokeless tobacco: Never  Vaping Use  . Vaping status: Never Used  Substance and Sexual Activity  . Alcohol  use: No    Alcohol /week: 0.0 standard drinks of alcohol   . Drug use: No  . Sexual activity: Defer    Partners: Female   Social Drivers of Health   Financial Resource  Strain: Low Risk  (03/14/2023)   Overall Financial Resource Strain (CARDIA)   . Difficulty of Paying Living Expenses: Not hard at all  Food Insecurity: No Food Insecurity (03/14/2023)   Hunger Vital Sign   . Worried About Programme researcher, broadcasting/film/video in the Last Year: Never true   . Ran Out of Food in the Last Year: Never true  Transportation Needs: No Transportation Needs (03/14/2023)   PRAPARE - Transportation   . Lack of Transportation (Medical): No   . Lack of Transportation (Non-Medical): No     Family History:  Family History  Problem Relation Name Age of Onset  . Heart disease Father    . COPD Mother    . Coronary Artery Disease (Blocked arteries around heart) Other    . COPD Other    . Diabetes Other    . Hyperlipidemia (Elevated cholesterol) Brother    .  Anesthesia problems Neg Hx    . Malignant hyperthermia Neg Hx       Review of Systems: Pertinent positives and negatives per HPI. Otherwise negative x full 12 pt review.  Physical Exam: BP 123/69 (BP Location: Right upper arm, Patient Position: Sitting, BP Cuff Size: Adult)   Pulse 75   Temp (!) 35.9 C (96.6 F) (Temporal)   Resp 14   Ht 174 cm (5' 8.5)   Wt 68.5 kg (151 lb) Comment: pt stated  BMI 22.63 kg/m  General appearance: NAD, A&Ox3 HEENT: normocephalic  Psych:  Normal mood and affect.  Neck:  Supple, normal appearance.  Respiratory:  Normal respiratory effort, bilateral chest rise equal Skin:  Warm and dry, no obvious rashes or lesions Gastrointestinal:  non-distended GU: AUS pump in right hemiscrotum is nontender and deactivated. Foley catheter in place.    Assessment:  Craig Wilson is a 83 y.o. with:  1. Urinary retention   2. SUI (stress urinary incontinence), male   3. Status post implantation of artificial urinary sphincter   4. H/O urethral stricture    Plan: Catheter removed today and patient is able to empty without difficulty. AUS was able to be activated and seems to cycle normally,  however, patient ultimately decided he preferred to have me deactivate it again and remain incontinent. Device deactivated and patient immediately started dripping again. Given prolonged 16Fr catheter and recent urethral stricture and dilation, will plan to follow patient back again in 1 month to ensure there are no signs/symptoms of erosion and no further issues with retention. He will call sooner should he develop dysuria or hematuria. We also discussed that even with his device deactivated, he should not have any indwelling catheter larger than 14Fr placed and it should not remain for more than a couple days. Should he start having issues with recurrent retention secondary to stricture or otherwise, and require chronic catheterization, he will either need to have his device removed or uncoupled. Patient states understanding.    Attestation Statement:   I personally performed the service, non-incident to. (WP)   AMY MARIE MACKIE, PA    A total of 55 minutes was spent in the care of this patient today/on the date of service including:  direct patient care, chart review and note preparation, documentation of care, coordination of care, patient/family education and counseling.

## 2024-01-09 ENCOUNTER — Emergency Department
Admission: EM | Admit: 2024-01-09 | Discharge: 2024-01-10 | Disposition: A | Discharge status: Home / Self Care | Attending: Emergency Medicine | Admitting: Emergency Medicine

## 2024-01-09 ENCOUNTER — Other Ambulatory Visit: Payer: Self-pay

## 2024-01-09 DIAGNOSIS — R1030 Lower abdominal pain, unspecified: Secondary | ICD-10-CM | POA: Diagnosis not present

## 2024-01-09 DIAGNOSIS — T839XXA Unspecified complication of genitourinary prosthetic device, implant and graft, initial encounter: Secondary | ICD-10-CM

## 2024-01-09 DIAGNOSIS — R339 Retention of urine, unspecified: Secondary | ICD-10-CM | POA: Diagnosis present

## 2024-01-09 NOTE — ED Triage Notes (Signed)
 Pt to ED via POV c/o urinary retention. Pt reports he hasn't been able to urinate in 9-12 hrs. Pt was here for same about a month ago. Hx of artificial sphincter. Pt is having lower abd pain

## 2024-01-09 NOTE — ED Notes (Signed)
 BLADDER SCAN SHOWS 

## 2024-01-09 NOTE — ED Notes (Signed)
 56fr cath was attempted by Paramedic Aura and Luke Peak, both was unsuccessful

## 2024-01-09 NOTE — ED Notes (Signed)
 Waddell and Rosa Rn attempted 16 fr and was also unsuccessful

## 2024-01-09 NOTE — ED Provider Notes (Addendum)
 I received signout for difficult catheter placement in this patient with artificial urinary sphincter, and I called Dr. Selma of urology who will come in to see the patient to attempt placement.  Dr. Selma was able to place Foley.  Patient stable for discharge and will follow-up with his urologist   Cyrena Mylar, MD 01/09/24 CHERYLYNN    Cyrena Mylar, MD 01/10/24 [phone]

## 2024-01-09 NOTE — ED Provider Notes (Signed)
 [facility] Provider Note    Event Date/Time   First MD Initiated Contact with Patient 01/09/24 2320     (approximate)   History   Urinary Retention   HPI  Craig Wilson is a 83 y.o. male with PMH of prostate cancer and artificial urinary sphincter who presents for evaluation of urinary retention.  Patient states that he last urinated about 9 to 12 hours ago.  He reports having some lower abdominal pain.  States that this happened about a month ago where urology was needed to place a catheter on 7/13.  Patient had the catheter removed on 7/28 by urology and passed a voiding trial.  They decided to deactivate the artificial urinary sphincter, recommended follow-up in about a month.  He denies fevers and back pain.     Physical Exam   Triage Vital Signs: ED Triage Vitals  Encounter Vitals Group     BP 01/09/24 2130 (!) 167/66     Girls Systolic BP Percentile --      Girls Diastolic BP Percentile --      Boys Systolic BP Percentile --      Boys Diastolic BP Percentile --      Pulse Rate 01/09/24 2130 80     Resp 01/09/24 2130 16     Temp 01/09/24 2130 99.6 F (37.6 C)     Temp Source 01/09/24 2130 Oral     SpO2 01/09/24 2130 99 %     Weight 01/09/24 2131 150 lb (68 kg)     Height --      Head Circumference --      Peak Flow --      Pain Score 01/09/24 2131 10     Pain Loc --      Pain Education --      Exclude from Growth Chart --     Most recent vital signs: Vitals:   01/09/24 2130  BP: (!) 167/66  Pulse: 80  Resp: 16  Temp: 99.6 F (37.6 C)  SpO2: 99%   General: Awake, no distress.  CV:  Good peripheral perfusion.  RRR. Resp:  Normal effort.  CTAB Abd:  No distention.  Soft, mild tenderness to palpation suprapubically. Other:     ED Results / Procedures / Treatments   Labs (all labs ordered are listed, but only abnormal results are displayed) Labs Reviewed  URINALYSIS, ROUTINE W REFLEX MICROSCOPIC     PROCEDURES:  Critical Care performed: No  Procedures   MEDICATIONS ORDERED IN ED: Medications - No data to display   IMPRESSION / MDM / ASSESSMENT AND PLAN / ED COURSE  I reviewed the triage vital signs and the nursing notes.                             83 year old male presents for evaluation of urinary retention.  Blood pressure is elevated otherwise vital signs are stable.  Patient mildly uncomfortable on exam.  Differential diagnosis includes, but is not limited to, urinary retention, urethral stricture, AKI, UTI.  Patient's presentation is most consistent with acute complicated illness / injury requiring diagnostic workup.  Bladder scan showed 513 mL in the bladder.  Will place an order for Foley insertion.  Clinical Course as of 01/09/24 2328  Fri Jan 09, 2024  2327 Nursing staff has attempted to place a catheter 4 different times using different sized foleys without success.  [LD]  2327 Care of the  patient will be passed off to the oncoming provider. [LD]    Clinical Course User Index [LD] Cleaster Tinnie LABOR, PA-C     FINAL CLINICAL IMPRESSION(S) / ED DIAGNOSES   Final diagnoses:  None     Rx / DC Orders   ED Discharge Orders     None        Note:  This document was prepared using Dragon voice recognition software and may include unintentional dictation errors.   Cleaster Tinnie LABOR, PA-C 01/09/24 2328    Arlander Charleston, MD 01/09/24 2330

## 2024-01-10 MED ORDER — LOPERAMIDE HCL 2 MG PO CAPS
4.0000 mg | ORAL_CAPSULE | Freq: Once | ORAL | Status: AC
  Administered 2024-01-10: 4 mg via ORAL
  Filled 2024-01-10: qty 2

## 2024-01-10 NOTE — Procedures (Signed)
 UROLOGY PROCEDURE NOTE   Indication: Urinary retention, artificial urinary sphincter   The patient was prepped and draped in standard sterile fashion.  Artificial urinary sphincter was confirmed deactivated.   I attempted to pass a 14 [place] Foley but met resistance in the proximal urethra.   A flexible cystoscope was used to intubate the urethra and the cuff appeared open, however there was a 5 French stricture proximal to the cuff.  A sensor wire was passed through the stricture into the bladder.  A 5 French access catheter was advanced over the wire and the wire removed and drip of clear yellow urine was noted.   Serial dilation was performed from 10 [place] to 18 [place] using disposable Cook urethral dilators.   A 14 French silicone Foley made into a council via 14 gauge needle passed easily over the wire into the bladder with return of clear yellow urine, 10 mL replaced in the balloon.    Plan: -3 days antibiotics -Continue Foley to drainage -Will need close follow-up this week with his [facility] urologist   Matt R. Cathline Dowen MD [facility]  Pager: [phone]

## 2024-01-10 NOTE — Discharge Instructions (Signed)
 Please see your urologist for follow-up appointment  Thank you for choosing us  for your health care today!  Please see your primary doctor this week for a follow up appointment.   If you have any new, worsening, or unexpected symptoms call your doctor right away or come back to the emergency department for reevaluation.  It was my pleasure to care for you today.   Ginnie EDISON Cyrena, MD

## 2024-01-10 NOTE — Consult Note (Signed)
 Urology Consult   Physician requesting consult: Lamar Price, MD  Reason for consult: Urinary retention  History of Present Illness: Craig Wilson is a 83 y.o. with a distant history of prostate cancer treated with prostatectomy and external beam radiation therapy.  He had an artificial urinary sphincter placed in 2006 and then removal and replacement in February 2022 by Dr. Andra at [facility].  He apparently had difficulty when the catheter was placed without deactivating the device postoperatively and the device has been left deactivated since 2022.  He is incontinent at baseline.  He also has a history of bladder neck contracture in 2021.  He developed urinary retention on 12/14/2023 and required bedside cystoscopy with urethral dilation by Dr. Francisca.  Patient states that he went to [facility] for void trial at the end of July and over the past 10 days, has been voiding relatively well until earlier this morning during his last void.  He states he has had a normal flow of stream until this morning.  Since then, he has not been to void.  Bladder scan was 500 mL in the ED.  ED staff was unable to place Foley catheter.  Urology was consulted.  He has some suprapubic discomfort.  He denies fevers, chills, dysuria, gross hematuria.  Past Medical History:  Diagnosis Date   Anemia    Bipolar disorder (HCC)    Chronic diarrhea    Colon polyps    Depression    Dermatophytosis    Diabetic neuropathy (HCC)    DJD (degenerative joint disease)    Dyspnea    Gout    Gross hematuria    HLD (hyperlipidemia)    HTN (hypertension)    Hyperkeratosis    Onychomycosis    Parkinson disease (HCC)    Prostate cancer (HCC)    Sleep apnea    Tremor    Type 2 diabetes mellitus (HCC)     Past Surgical History:  Procedure Laterality Date   cataract surgery Bilateral    COLONOSCOPY W/ BIOPSIES     FOOT SURGERY     PROSTATE SURGERY     removed   status post implantation of artificial urinary sphincter       TONSILLECTOMY      Current Hospital Medications:  Home Meds:  No current facility-administered medications on file prior to encounter.   Current Outpatient Medications on File Prior to Encounter  Medication Sig Dispense Refill   albuterol  (VENTOLIN  HFA) 108 (90 Base) MCG/ACT inhaler Inhale 2 puffs into the lungs every 6 (six) hours as needed for wheezing or shortness of breath. 8 g 0   aspirin  EC 81 MG tablet Take 81 mg by mouth daily. Swallow whole.     atorvastatin  (LIPITOR) 40 MG tablet TAKE 1 TABLET BY MOUTH IN THE  EVENING 90 tablet 2   guaiFENesin -dextromethorphan  (ROBITUSSIN DM) 100-10 MG/5ML syrup Take 5 mLs by mouth every 4 (four) hours as needed for cough. 118 mL 0   lithium  carbonate 300 MG capsule Take 300 mg by mouth 2 (two) times daily.     loperamide  (IMODIUM ) 2 MG capsule Take 1 capsule (2 mg total) by mouth as needed for diarrhea or loose stools. 30 capsule 0   nystatin cream (MYCOSTATIN) Apply 1 Application topically 2 (two) times daily as needed.     rOPINIRole  (REQUIP ) 2 MG tablet Take 2 mg by mouth at bedtime.     traZODone  (DESYREL ) 50 MG tablet Take 50-100 mg by mouth at bedtime.  Scheduled Meds: Continuous Infusions: PRN Meds:.  Allergies:  Allergies  Allergen Reactions   Carbidopa -Levodopa  Other (See Comments)    irrational behavior , felt zombie like   Carbidopa -Levodopa  Other (See Comments)    irrational behavior , felt zombie like   Latex Other (See Comments)    Internal such as catheters. Latex gloves are okay   Solifenacin Other (See Comments)    Family History  Problem Relation Age of Onset   Heart disease Mother    Heart attack Father 16   Hypertension Father    Hyperlipidemia Father    Kidney cancer Neg Hx    Prostate cancer Neg Hx    Bladder Cancer Neg Hx     Social History:  reports that he quit smoking about 24 years ago. His smoking use included cigarettes. He has never used smokeless tobacco. He reports that he does not  drink alcohol  and does not use drugs.  ROS: A complete review of systems was performed.  All systems are negative except for pertinent findings as noted.  Physical Exam:  Vital signs in last 24 hours: Temp:  [99.6 F (37.6 C)] 99.6 F (37.6 C) (08/08 2130) Pulse Rate:  [80] 80 (08/08 2130) Resp:  [16] 16 (08/08 2130) BP: (167)/(66) 167/66 (08/08 2130) SpO2:  [99 %] 99 % (08/08 2130) Weight:  [68 kg] 68 kg (08/08 2131) Constitutional:  Alert and oriented, No acute distress Cardiovascular: Regular rate and rhythm Respiratory: Normal respiratory effort, Lungs clear bilaterally GI: Abdomen is soft, nontender, nondistended, no abdominal masses GU: No CVA tenderness; artificial urinary sphincter in place.  This is confirmed to be deactivated in the scrotum. Neurologic: Grossly intact, no focal deficits Psychiatric: Normal mood and affect  Laboratory Data:  No results for input(s): WBC, HGB, HCT, PLT in the last 72 hours.  No results for input(s): NA, K, CL, GLUCOSE, BUN, CALCIUM , CREATININE in the last 72 hours.  Invalid input(s): CO3   No results found for this or any previous visit (from the past 24 hours). No results found for this or any previous visit (from the past 240 hours).  Renal Function: No results for input(s): CREATININE in the last 168 hours. CrCl cannot be calculated (Patient's most recent lab result is older than the maximum 21 days allowed.).  Radiologic Imaging: No results found.  I independently reviewed the above imaging studies.  Impression/Recommendation Acute urinary retention likely secondary to recurrent bladder neck contracture given history of prostate cancer and radiation therapy. History of incontinence status post artificial urinary sphincter last placed in 2022 at [facility].  This has been deactivated since then and he has baseline incontinence.  -See separate procedure note.  16 French silicone catheter was placed given  latex allergy after dilating his urethra.  Immediate return of clear yellow urine. - Okay to discharge from urology perspective.  Recommend 3 days of antibiotics given dilation and Foley catheter placement. - Patient will call [facility] and follow-up with them early this week.  Discussed importance for catheter to be left in place only for a few days given presence of artificial urinary sphincter.  Discussed that he may require further surgery including bladder neck contracture incision.  Discussed that if he develops recurrent urinary retention, he may benefit from suprapubic tube.  Matt R. Miasha Emmons MD 01/10/2024, 1:01 AM  [facility]  Pager: [phone]

## 2024-01-24 ENCOUNTER — Other Ambulatory Visit: Payer: Self-pay | Admitting: Cardiovascular Disease

## 2024-01-30 ENCOUNTER — Emergency Department

## 2024-01-30 ENCOUNTER — Other Ambulatory Visit: Payer: Self-pay

## 2024-01-30 ENCOUNTER — Emergency Department
Admission: EM | Admit: 2024-01-30 | Discharge: 2024-01-31 | Disposition: A | Discharge status: Home / Self Care | Attending: Emergency Medicine | Admitting: Emergency Medicine

## 2024-01-30 DIAGNOSIS — R509 Fever, unspecified: Secondary | ICD-10-CM | POA: Insufficient documentation

## 2024-01-30 DIAGNOSIS — R0602 Shortness of breath: Secondary | ICD-10-CM | POA: Diagnosis not present

## 2024-01-30 DIAGNOSIS — R070 Pain in throat: Secondary | ICD-10-CM

## 2024-01-30 DIAGNOSIS — Y732 Prosthetic and other implants, materials and accessory gastroenterology and urology devices associated with adverse incidents: Secondary | ICD-10-CM | POA: Diagnosis not present

## 2024-01-30 DIAGNOSIS — T83510A Infection and inflammatory reaction due to cystostomy catheter, initial encounter: Secondary | ICD-10-CM | POA: Insufficient documentation

## 2024-01-30 DIAGNOSIS — M542 Cervicalgia: Secondary | ICD-10-CM | POA: Diagnosis not present

## 2024-01-30 DIAGNOSIS — N390 Urinary tract infection, site not specified: Secondary | ICD-10-CM

## 2024-01-30 LAB — BASIC METABOLIC PANEL WITH GFR
Anion gap: 10 (ref 5–15)
BUN: 15 mg/dL (ref 8–23)
CO2: 24 mmol/L (ref 22–32)
Calcium: 10.4 mg/dL — ABNORMAL HIGH (ref 8.9–10.3)
Chloride: 104 mmol/L (ref 98–111)
Creatinine, Ser: 1.08 mg/dL (ref 0.61–1.24)
GFR, Estimated: >60 mL/min (ref >60)
Glucose, Bld: 113 mg/dL — ABNORMAL HIGH (ref 70–99)
Potassium: 3.8 mmol/L (ref 3.5–5.1)
Sodium: 138 mmol/L (ref 135–145)

## 2024-01-30 LAB — CBC
HCT: 33.7 % — ABNORMAL LOW (ref 39.0–52.0)
Hemoglobin: 10.8 g/dL — ABNORMAL LOW (ref 13.0–17.0)
MCH: 32.3 pg (ref 26.0–34.0)
MCHC: 32 g/dL (ref 30.0–36.0)
MCV: 100.9 fL — ABNORMAL HIGH (ref 80.0–100.0)
Platelets: 123 K/uL — ABNORMAL LOW (ref 150–400)
RBC: 3.34 MIL/uL — ABNORMAL LOW (ref 4.22–5.81)
RDW: 12 % (ref 11.5–15.5)
WBC: 13.2 K/uL — ABNORMAL HIGH (ref 4.0–10.5)
nRBC: 0 % (ref 0.0–0.2)

## 2024-01-30 MED ORDER — LACTATED RINGERS IV BOLUS (SEPSIS)
1000.0000 mL | Freq: Once | INTRAVENOUS | Status: AC
  Administered 2024-01-31: 1000 mL via INTRAVENOUS

## 2024-01-30 MED ORDER — ACETAMINOPHEN 325 MG PO TABS
650.0000 mg | ORAL_TABLET | Freq: Once | ORAL | Status: AC
  Administered 2024-01-30: 650 mg via ORAL
  Filled 2024-01-30: qty 2

## 2024-01-30 NOTE — ED Provider Notes (Signed)
 [facility] Provider Note    Event Date/Time   First MD Initiated Contact with Patient 01/30/24 2345     (approximate)   History   Shortness of Breath   HPI Craig Wilson is a 83 y.o. male who presents for evaluation of discomfort in his neck.  About 2 days ago he had a procedure at [facility] to remove an artificial urinary sphincter and that procedure went successfully.  It required general anesthesia and intubation, and since that time he has had substantial amount of discomfort in his neck with a hoarse voice and increasing amounts of discomfort when he tries to swallow.  He frequently feels like he has to clear his throat and he is not certain if he has any swelling.  He is able to tolerate oral intake and is not spitting out his secretions, but his voice is hoarse and is difficult to talk.  His wife was not certain if he was having trouble breathing so she called EMS and the triage note was for shortness of breath, but he denies shortness of breath and states that it is more an issue of his neck and throat.  He was unaware that he had an elevated temperature until he got here.  He has not been vomiting.  He is not having chest pain nor abdominal pain.  He has clear yellow urine draining into his leg bag and has had only minimal discomfort as result of the surgery.  No posterior neck pain or stiffness.     Physical Exam   Triage Vital Signs: ED Triage Vitals  Encounter Vitals Group     BP 01/30/24 2219 (!) 141/60     Girls Systolic BP Percentile --      Girls Diastolic BP Percentile --      Boys Systolic BP Percentile --      Boys Diastolic BP Percentile --      Pulse Rate 01/30/24 2219 100     Resp 01/30/24 2219 20     Temp 01/30/24 2219 (!) 100.5 F (38.1 C)     Temp Source 01/30/24 2219 Oral     SpO2 01/30/24 2219 100 %     Weight --      Height 01/30/24 2220 1.702 m (5' 7)     Head Circumference --      Peak Flow --      Pain Score 01/30/24  2220 0     Pain Loc --      Pain Education --      Exclude from Growth Chart --     Most recent vital signs: Vitals:   01/31/24 0430 01/31/24 0500  BP: (!) 128/46 (!) 132/47  Pulse: 76 76  Resp:    Temp:    SpO2: 95% 95%    General: Awake, elderly but well-appearing, pleasant and conversant. Oropharynx/neck: Posterior oropharynx is mildly erythematous but without obvious abscess or swelling.  No appreciable angioedema.  No palpable deformities externally of his neck or throat including no submandibular swelling nor tenderness.  No pain or tenderness with manipulation of the larynx.  CV:  Good peripheral perfusion.  Borderline tachycardia, normal heart sounds. Resp:  Normal effort. Speaking easily and comfortably, no accessory muscle usage nor intercostal retractions.  Lungs are clear to auscultation bilaterally. Abd:  No distention.  No tenderness to palpation. Other:  Urine bag has clear yellow urine with no obvious sediment nor hematuria.   ED Results / Procedures / Treatments  Labs (all labs ordered are listed, but only abnormal results are displayed) Labs Reviewed  BASIC METABOLIC PANEL WITH GFR - Abnormal; Notable for the following components:      Result Value   Glucose, Bld 113 (*)    Calcium  10.4 (*)    All other components within normal limits  CBC - Abnormal; Notable for the following components:   WBC 13.2 (*)    RBC 3.34 (*)    Hemoglobin 10.8 (*)    HCT 33.7 (*)    MCV 100.9 (*)    Platelets 123 (*)    All other components within normal limits  URINALYSIS, W/ REFLEX TO CULTURE (INFECTION SUSPECTED) - Abnormal; Notable for the following components:   Color, Urine YELLOW (*)    APPearance CLEAR (*)    Hgb urine dipstick MODERATE (*)    Protein, ur 100 (*)    Leukocytes,Ua LARGE (*)    Bacteria, UA RARE (*)    All other components within normal limits  CULTURE, BLOOD (ROUTINE X 2)  CULTURE, BLOOD (ROUTINE X 2)  URINE CULTURE  LACTIC ACID, PLASMA   LACTIC ACID, PLASMA     EKG  ED ECG REPORT I, Darleene Dome, the attending physician, personally viewed and interpreted this ECG.  Date: 01/30/2024 EKG Time: 22: 25 Rate: 97 Rhythm: Sinus rhythm with first-degree AV block and occasional PVCs QRS Axis: normal Intervals: Abnormal secondary to PR interval of 218 with slightly prolonged QTc interval of 497 ms ST/T Wave abnormalities: Non-specific ST segment / T-wave changes, but no clear evidence of acute ischemia. Narrative Interpretation: no definitive evidence of acute ischemia; does not meet STEMI criteria.    RADIOLOGY See ED course for details   PROCEDURES:  Critical Care performed: No  Procedures    IMPRESSION / MDM / ASSESSMENT AND PLAN / ED COURSE  I reviewed the triage vital signs and the nursing notes.                              Differential diagnosis includes, but is not limited to, postoperative infection, vocal cord or other throat injury as result of intubation, side effect of general anesthesia, healthcare associated pneumonia, urinary tract infection.  Patient's presentation is most consistent with acute presentation with potential threat to life or bodily function.  Labs/studies ordered: I ordered standard sepsis labs/studies including the following: respiratory viral panel PCR swab, blood cultures x2, pro time-INR, CMP, urinalysis, urine culture, lactic acid, CBC with differential, high-sensitivity troponin, lipase, 1-view chest x-ray, EKG. also ordered CT soft tissue neck.  Interventions/Medications given:  Medications  acetaminophen  (TYLENOL ) tablet 650 mg (650 mg Oral Given 01/30/24 2225)  lactated ringers  bolus 1,000 mL (0 mLs Intravenous Stopped 01/31/24 0407)  iohexol  (OMNIPAQUE ) 300 MG/ML solution 75 mL (75 mLs Intravenous Contrast Given 01/31/24 0118)  dexamethasone  (DECADRON ) injection 10 mg (10 mg Intravenous Given 01/31/24 0406)  cefTRIAXone  (ROCEPHIN ) 1 g in sodium chloride  0.9 % 100 mL IVPB  (0 g Intravenous Stopped 01/31/24 0519)    (Note:  hospital course my include additional interventions and/or labs/studies not listed above.)   I independently viewed and interpreted the patient's chest x-ray(s) and there is no evidence of pneumonia.  Labs are notable for a temperature of 100.5 and tachycardia around 100.  Postoperative infection only 2 days after the infection would be very unexpected.  However he also has a leukocytosis of 13.2.  His only symptoms are his throat and  I am proceeding with CT of the neck to further evaluate.  Of note, urinalysis shows moderate hemoglobinuria with large leukocytes.  This is likely normal and the postoperative setting, but could be the source of his infection as well.  I am ordering a urine culture.     Clinical Course as of 01/31/24 0539  Sat Jan 31, 2024  0351 I independently viewed and interpreted the patient's CT of the neck and I can see no evidence of substantial swelling or airway impingement or obvious infection.  The radiologist mentioned some inflammatory changes around the vocal cords that are, in this case, likely due to the recent instrumentation from his surgery.  I reassessed the patient and he says that he feels quite well.  His heart rate is down consistently in the 70s.  He said that his throat feels better to even though he is still hoarse and clearing his throat regularly.  I offered hospitalization given the fever and slight elevation of his white blood cell count as well as the neck discomfort although I believe this is likely a transient issue.  I also explained that it looks like he has a urinary tract infection although this may be chronic as well or the direct result of his recent procedure.  The patient very much does not want to stay in the hospital and we discussed various options.  Ultimately we decided that he would go home with antibiotics for probable urinary tract infection (Vantin  for third generation cephalosporin  coverage) and I gave a dose of ceftriaxone  1 g IV prior to discharge.  Also given a dose of Decadron  10 mg IV which I think will help with the vocal cord inflammation from the recent instrumentation.  I reiterated multiple times verbally to him and his wife and in the written paperwork that they should return immediately if he develops new or worsening symptoms.  I offered hospitalization multiple times as well but he and his wife are comfortable with the plan for discharge.  I gave strict follow-up and return precautions. [CF]    Clinical Course User Index [CF] Gordan Huxley, MD     FINAL CLINICAL IMPRESSION(S) / ED DIAGNOSES   Final diagnoses:  Fever, unspecified fever cause  Urinary tract infection associated with cystostomy catheter, initial encounter (HCC)  Throat discomfort     Rx / DC Orders   ED Discharge Orders          Ordered    cefpodoxime  (VANTIN ) 200 MG tablet  2 times daily        01/31/24 0355             Note:  This document was prepared using Dragon voice recognition software and may include unintentional dictation errors.   Gordan Huxley, MD 01/31/24 [phone]

## 2024-01-30 NOTE — ED Triage Notes (Signed)
 Pt to ed from home via POV for SOB. Pt was just Dc'd from [facility] yesterday and had a artificial sphincter taken out. Pt is caox4, in no acute distress and in a wheel chair.

## 2024-01-30 NOTE — ED Notes (Signed)
 Blue top and lactic sent down.

## 2024-01-31 ENCOUNTER — Emergency Department

## 2024-01-31 LAB — URINALYSIS, W/ REFLEX TO CULTURE (INFECTION SUSPECTED)
Bilirubin Urine: NEGATIVE
Glucose, UA: NEGATIVE mg/dL
Ketones, ur: NEGATIVE mg/dL
Nitrite: NEGATIVE
Protein, ur: 100 mg/dL — AB
Specific Gravity, Urine: 1.009 (ref 1.005–1.030)
Squamous Epithelial / HPF: 0 /HPF (ref 0–5)
pH: 6 (ref 5.0–8.0)

## 2024-01-31 MED ORDER — AMOXICILLIN-POT CLAVULANATE 875-125 MG PO TABS: 1.0000 | ORAL_TABLET | Freq: Once | ORAL | Status: DC

## 2024-01-31 MED ORDER — DEXAMETHASONE SODIUM PHOSPHATE 10 MG/ML IJ SOLN
10.0000 mg | Freq: Once | INTRAMUSCULAR | Status: AC
  Administered 2024-01-31: 10 mg via INTRAVENOUS
  Filled 2024-01-31: qty 1

## 2024-01-31 MED ORDER — IOHEXOL 300 MG/ML  SOLN
75.0000 mL | Freq: Once | INTRAMUSCULAR | Status: AC | PRN
  Administered 2024-01-31: 75 mL via INTRAVENOUS

## 2024-01-31 MED ORDER — SODIUM CHLORIDE 0.9 % IV SOLN
1.0000 g | INTRAVENOUS | Status: AC
  Administered 2024-01-31: 1 g via INTRAVENOUS
  Filled 2024-01-31: qty 10

## 2024-01-31 MED ORDER — CEFPODOXIME PROXETIL 200 MG PO TABS: 200.0000 mg | ORAL_TABLET | Freq: Two times a day (BID) | ORAL | 0 refills | Status: AC

## 2024-01-31 NOTE — Discharge Instructions (Signed)
 As we discussed, it is very difficult to determine what exactly was causing your fever tonight.  It could be related to your recent surgery, an infection of your urine, or something completely unrelated.  We discussed bringing you into the hospital but your preference was to go home.  We understand and encourage you to take the full course of antibiotics as prescribed unless another physician suggests a different antibiotic for you based on additional information that may come back from your labs over time (the cultures).  Please follow-up with your regular doctor and/or your urologist at the next available opportunity for reassessment.  You may also want to call the office of Dr. Milissa with ENT (Ear, Nose, & Throat) to see him or one of his colleagues since you are having issues with your vocal cords and throat after your recent intubation.    Return to the emergency department if you develop new or worsening symptoms that concern you.

## 2024-02-03 LAB — URINE CULTURE: Culture: >=100000 — AB

## 2024-02-04 ENCOUNTER — Telehealth: Payer: Self-pay

## 2024-02-04 NOTE — Telephone Encounter (Signed)
 Susceptibility   Pseudomonas putida    MIC    CEFEPIME  2 SENSITIVE Sensitive    CIPROFLOXACIN >=4 RESISTANT Resistant    MEROPENEM 4 SENSITIVE Sensitive    PIP/TAZO  Sensitive 1      F/U call for UC results. Spoke with MD Ernest and she would like the patient to come back to the hosp to be treated. Called pt and LVM. Will call again before the end of the shift.

## 2024-02-04 NOTE — Telephone Encounter (Signed)
 Called patient x2 to discuss UC Lab results. LVM.

## 2024-02-05 LAB — CULTURE, BLOOD (ROUTINE X 2)
Culture: NO GROWTH
Culture: NO GROWTH

## 2024-02-05 NOTE — Telephone Encounter (Signed)
 Called patient today to discuss ED culture results and LVM.

## 2024-02-06 ENCOUNTER — Emergency Department

## 2024-02-06 ENCOUNTER — Emergency Department
Admission: EM | Admit: 2024-02-06 | Discharge: 2024-02-06 | Disposition: A | Discharge status: Home / Self Care | Attending: Emergency Medicine | Admitting: Emergency Medicine

## 2024-02-06 ENCOUNTER — Other Ambulatory Visit: Payer: Self-pay

## 2024-02-06 DIAGNOSIS — G20A1 Parkinson's disease without dyskinesia, without mention of fluctuations: Secondary | ICD-10-CM | POA: Insufficient documentation

## 2024-02-06 DIAGNOSIS — E119 Type 2 diabetes mellitus without complications: Secondary | ICD-10-CM | POA: Diagnosis not present

## 2024-02-06 DIAGNOSIS — J690 Pneumonitis due to inhalation of food and vomit: Secondary | ICD-10-CM | POA: Diagnosis not present

## 2024-02-06 DIAGNOSIS — F319 Bipolar disorder, unspecified: Secondary | ICD-10-CM | POA: Diagnosis not present

## 2024-02-06 DIAGNOSIS — D649 Anemia, unspecified: Secondary | ICD-10-CM | POA: Diagnosis not present

## 2024-02-06 DIAGNOSIS — R059 Cough, unspecified: Secondary | ICD-10-CM | POA: Diagnosis present

## 2024-02-06 DIAGNOSIS — I4510 Unspecified right bundle-branch block: Secondary | ICD-10-CM | POA: Insufficient documentation

## 2024-02-06 DIAGNOSIS — I2510 Atherosclerotic heart disease of native coronary artery without angina pectoris: Secondary | ICD-10-CM | POA: Insufficient documentation

## 2024-02-06 DIAGNOSIS — I10 Essential (primary) hypertension: Secondary | ICD-10-CM | POA: Insufficient documentation

## 2024-02-06 DIAGNOSIS — I700 Atherosclerosis of aorta: Secondary | ICD-10-CM | POA: Insufficient documentation

## 2024-02-06 DIAGNOSIS — G20C Parkinsonism, unspecified: Secondary | ICD-10-CM | POA: Insufficient documentation

## 2024-02-06 DIAGNOSIS — Z9889 Other specified postprocedural states: Secondary | ICD-10-CM | POA: Diagnosis present

## 2024-02-06 DIAGNOSIS — E785 Hyperlipidemia, unspecified: Secondary | ICD-10-CM | POA: Insufficient documentation

## 2024-02-06 DIAGNOSIS — R0602 Shortness of breath: Secondary | ICD-10-CM | POA: Diagnosis present

## 2024-02-06 LAB — CBC
HCT: 34.6 % — ABNORMAL LOW (ref 39.0–52.0)
Hemoglobin: 10.7 g/dL — ABNORMAL LOW (ref 13.0–17.0)
MCH: 31.8 pg (ref 26.0–34.0)
MCHC: 30.9 g/dL (ref 30.0–36.0)
MCV: 103 fL — ABNORMAL HIGH (ref 80.0–100.0)
Platelets: 141 K/uL — ABNORMAL LOW (ref 150–400)
RBC: 3.36 MIL/uL — ABNORMAL LOW (ref 4.22–5.81)
RDW: 11.8 % (ref 11.5–15.5)
WBC: 6.4 K/uL (ref 4.0–10.5)
nRBC: 0 % (ref 0.0–0.2)

## 2024-02-06 LAB — BASIC METABOLIC PANEL WITH GFR
Anion gap: 9 (ref 5–15)
BUN: 14 mg/dL (ref 8–23)
CO2: 20 mmol/L — ABNORMAL LOW (ref 22–32)
Calcium: 9.4 mg/dL (ref 8.9–10.3)
Chloride: 108 mmol/L (ref 98–111)
Creatinine, Ser: 0.93 mg/dL (ref 0.61–1.24)
GFR, Estimated: >60 mL/min (ref >60)
Glucose, Bld: 102 mg/dL — ABNORMAL HIGH (ref 70–99)
Potassium: 4.2 mmol/L (ref 3.5–5.1)
Sodium: 137 mmol/L (ref 135–145)

## 2024-02-06 LAB — RESP PANEL BY RT-PCR (RSV, FLU A&B, COVID)  RVPGX2
Influenza A by PCR: NEGATIVE
Influenza B by PCR: NEGATIVE
Resp Syncytial Virus by PCR: NEGATIVE
SARS Coronavirus 2 by RT PCR: NEGATIVE

## 2024-02-06 MED ORDER — IPRATROPIUM-ALBUTEROL 0.5-2.5 (3) MG/3ML IN SOLN
3.0000 mL | Freq: Once | RESPIRATORY_TRACT | Status: AC
  Administered 2024-02-06: 3 mL via RESPIRATORY_TRACT
  Filled 2024-02-06: qty 3

## 2024-02-06 MED ORDER — CLINDAMYCIN HCL 300 MG PO CAPS: 300.0000 mg | ORAL_CAPSULE | Freq: Three times a day (TID) | ORAL | 0 refills | Status: AC

## 2024-02-06 MED ORDER — CLINDAMYCIN HCL 150 MG PO CAPS
300.0000 mg | ORAL_CAPSULE | Freq: Once | ORAL | Status: AC
  Administered 2024-02-06: 300 mg via ORAL
  Filled 2024-02-06: qty 2

## 2024-02-06 NOTE — ED Triage Notes (Signed)
 PT to ED from home with complaints of SOB, productive cough and sore throat. Pt had surgery on bladder on 8/28. Pt was told throat could be sore after intubation from surgery. Pt states that throat is still very sore, productive cough and some wheezing at night time. Pt was seen here day after surgery for same complaints but states it is getting worse. Pt was put on antibiotics when dc from here and is still taking antibiotic

## 2024-02-06 NOTE — Discharge Instructions (Signed)
 Please take your antibiotic as prescribed for its entire course.  Please follow-up with your doctor for recheck/reevaluation within the next several days.  Return to the emergency department for any worsening cough, if you develop a fever, shortness of breath or any other symptom concerning to yourself.

## 2024-02-06 NOTE — ED Provider Notes (Signed)
 [facility] Provider Note    Event Date/Time   First MD Initiated Contact with Patient 02/06/24 1744     (approximate)  History   Chief Complaint: Cough and Shortness of Breath  HPI  Craig Wilson is a 83 y.o. male with a past medical history of anemia, bipolar, hypertension, hyperlipidemia, Parkinson's, diabetes, presents to the emergency department for continued cough congestion slight shortness of breath.  Patient states 8/28 he had a bladder surgery performed which included general anesthesia and intubation.  Patient states since that time he has had a sore throat and has now had a progressive worsening cough.  Patient presented to the emergency department he states shortly after this with a sore throat was prescribed an antibiotic as a precaution which he has been taking but the patient continues to have a cough so he came to the emergency department for evaluation.  Patient states he has noticed a wheeze at times when he breathes.  Denies any fever.  No chest pain.  Physical Exam   Triage Vital Signs: ED Triage Vitals  Encounter Vitals Group     BP 02/06/24 1436 (!) 163/66     Girls Systolic BP Percentile --      Girls Diastolic BP Percentile --      Boys Systolic BP Percentile --      Boys Diastolic BP Percentile --      Pulse Rate 02/06/24 1436 85     Resp 02/06/24 1436 20     Temp 02/06/24 1436 99.1 F (37.3 C)     Temp Source 02/06/24 1436 Oral     SpO2 02/06/24 1436 93 %     Weight 02/06/24 1437 149 lb 14.6 oz (68 kg)     Height --      Head Circumference --      Peak Flow --      Pain Score 02/06/24 1437 0     Pain Loc --      Pain Education --      Exclude from Growth Chart --     Most recent vital signs: Vitals:   02/06/24 1436  BP: (!) 163/66  Pulse: 85  Resp: 20  Temp: 99.1 F (37.3 C)  SpO2: 93%    General: Awake, no distress.  CV:  Good peripheral perfusion.  Regular rate and rhythm  Resp:  Normal effort.  Equal  breath sounds bilaterally.  Some upper airway noises but breath sounds appear clear.  Most consistent with secretions.  No stridor. Abd:  No distention.  Soft, nontender.  ED Results / Procedures / Treatments   EKG  EKG viewed and interpreted by myself shows a normal sinus rhythm at 82 bpm with a narrow QRS, normal axis, normal intervals, nonspecific ST changes.  RADIOLOGY  I have reviewed interpret the chest x-ray images.  No consolidation on my evaluation. Radiology has read the x-ray is negative   MEDICATIONS ORDERED IN ED: Medications  ipratropium-albuterol  (DUONEB) 0.5-2.5 (3) MG/3ML nebulizer solution 3 mL (3 mLs Nebulization Given 02/06/24 1820)     IMPRESSION / MDM / ASSESSMENT AND PLAN / ED COURSE  I reviewed the triage vital signs and the nursing notes.  Patient's presentation is most consistent with acute presentation with potential threat to life or bodily function.  Patient presents emergency department for continued cough since his surgery 1 week ago.  Overall patient appears well, no distress.  Does have an occasional cough during exam but able to speak  in full sentences.  Satting in the upper 90s during my evaluation on room air.  Patient is labwork shows a reassuring CBC, reassuring chemistry.  Chest x-ray appears clear.  However given the patient's worsening cough symptoms despite antibiotics and some upper airway noises/secretions we will obtain a CT scan of the chest without contrast to evaluate for occult pneumonia.  We will send a COVID/flu swab as a precaution and continue to closely monitor.  Patient agreeable to plan.  COVID/flu is negative.  CBC is normal, chemistry is normal.  CT scan has resulted showing left ground glass nodule in the upper lobe compatible with inflammatory infectious focus.  Given the patient's recent airway manipulation/intubation now with left upper lobe findings we will cover with clindamycin  for presumed aspiration pneumonia.  Patient will  discontinue the other antibiotic that he was taking.  Patient agreeable to plan.  FINAL CLINICAL IMPRESSION(S) / ED DIAGNOSES   Cough Dyspnea Aspiration pneumonia  Note:  This document was prepared using Dragon voice recognition software and may include unintentional dictation errors.   Dorothyann Drivers, MD 02/06/24 2010

## 2024-02-22 NOTE — Progress Notes (Deleted)
 NO SHOW

## 2024-02-23 ENCOUNTER — Ambulatory Visit: Attending: Cardiovascular Disease | Admitting: Cardiovascular Disease

## 2024-02-23 DIAGNOSIS — E118 Type 2 diabetes mellitus with unspecified complications: Secondary | ICD-10-CM

## 2024-02-23 DIAGNOSIS — R931 Abnormal findings on diagnostic imaging of heart and coronary circulation: Secondary | ICD-10-CM

## 2024-02-23 DIAGNOSIS — I10 Essential (primary) hypertension: Secondary | ICD-10-CM

## 2024-02-23 DIAGNOSIS — G20A1 Parkinson's disease without dyskinesia, without mention of fluctuations: Secondary | ICD-10-CM

## 2024-02-23 DIAGNOSIS — R0602 Shortness of breath: Secondary | ICD-10-CM

## 2024-02-23 DIAGNOSIS — E782 Mixed hyperlipidemia: Secondary | ICD-10-CM

## 2024-03-06 ENCOUNTER — Emergency Department: Admission: EM | Admit: 2024-03-06 | Discharge: 2024-03-06 | Disposition: A | Discharge status: Home / Self Care

## 2024-03-06 ENCOUNTER — Other Ambulatory Visit: Payer: Self-pay

## 2024-03-06 DIAGNOSIS — Y732 Prosthetic and other implants, materials and accessory gastroenterology and urology devices associated with adverse incidents: Secondary | ICD-10-CM | POA: Diagnosis not present

## 2024-03-06 DIAGNOSIS — E1142 Type 2 diabetes mellitus with diabetic polyneuropathy: Secondary | ICD-10-CM | POA: Insufficient documentation

## 2024-03-06 DIAGNOSIS — G20A1 Parkinson's disease without dyskinesia, without mention of fluctuations: Secondary | ICD-10-CM | POA: Insufficient documentation

## 2024-03-06 DIAGNOSIS — T83098A Other mechanical complication of other indwelling urethral catheter, initial encounter: Secondary | ICD-10-CM | POA: Diagnosis present

## 2024-03-06 DIAGNOSIS — R8281 Pyuria: Secondary | ICD-10-CM | POA: Insufficient documentation

## 2024-03-06 DIAGNOSIS — I10 Essential (primary) hypertension: Secondary | ICD-10-CM | POA: Diagnosis not present

## 2024-03-06 DIAGNOSIS — T83011A Breakdown (mechanical) of indwelling urethral catheter, initial encounter: Secondary | ICD-10-CM

## 2024-03-06 DIAGNOSIS — Z8546 Personal history of malignant neoplasm of prostate: Secondary | ICD-10-CM | POA: Insufficient documentation

## 2024-03-06 LAB — URINALYSIS, COMPLETE (UACMP) WITH MICROSCOPIC
Bilirubin Urine: NEGATIVE
Glucose, UA: >=500 mg/dL — AB
Ketones, ur: NEGATIVE mg/dL
Nitrite: NEGATIVE
Protein, ur: NEGATIVE mg/dL
Specific Gravity, Urine: 1.006 (ref 1.005–1.030)
pH: 7 (ref 5.0–8.0)

## 2024-03-06 MED ORDER — CEPHALEXIN 500 MG PO CAPS
500.0000 mg | ORAL_CAPSULE | Freq: Once | ORAL | Status: AC
  Administered 2024-03-06: 500 mg via ORAL
  Filled 2024-03-06: qty 1

## 2024-03-06 MED ORDER — CEPHALEXIN 500 MG PO CAPS: 500.0000 mg | ORAL_CAPSULE | Freq: Two times a day (BID) | ORAL | 0 refills | Status: AC

## 2024-03-06 NOTE — ED Provider Notes (Signed)
 [facility] Provider Note    Event Date/Time   First MD Initiated Contact with Patient 03/06/24 1605     (approximate)   History   foley catheter problem  Pt to ED for foley catheter problem. States is leaking urine around foley and also has not seen any output since this AM. He emptied the leg bag this AM and it had and since 0730 no output except a few mL. Not having bladder spasms. Is here because he would like a bladder scan. No pain at all. Ambulatory with cane.   HPI Craig Wilson is a 83 y.o. male PMH hypertension, hyperlipidemia, T2DM, diabetic neuropathy and bipolar disorder, Parkinson's disease, prostate cancer presents for evaluation of leaking Foley catheter - Has had leaking around his Foley catheter since this morning, minimal urine output.  No pain or distention.  No fever no change in urine color or smell. 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  Noted to have artificial urinary sphincter, found to have urinary retention at that time.  Difficult to place Foley by ED staff, urologist came to the ED and was able to place Foley.  Appears patient does have follow-up scheduled with his urologist on 03/10/2024.     Physical Exam   Triage Vital Signs: ED Triage Vitals  Encounter Vitals Group     BP 03/06/24 1545 (!) 146/59     Girls Systolic BP Percentile --      Girls Diastolic BP Percentile --      Boys Systolic BP Percentile --      Boys Diastolic BP Percentile --      Pulse Rate 03/06/24 1545 78     Resp 03/06/24 1545 16     Temp 03/06/24 1545 98.6 F (37 C)     Temp Source 03/06/24 1545 Oral     SpO2 03/06/24 1545 98 %     Weight 03/06/24 1544 144 lb (65.3 kg)     Height  03/06/24 1544 5' 8 (1.727 m)     Head Circumference --      Peak Flow --      Pain Score 03/06/24 1543 0     Pain Loc --      Pain Education --      Exclude from Growth Chart --     Most recent vital signs: Vitals:   03/06/24 1545  BP: (!) 146/59  Pulse: 78  Resp: 16  Temp: 98.6 F (37 C)  SpO2: 98%     General: Awake, no distress.  CV:  Good peripheral perfusion. Resp:  Normal effort. Abd:  No distention. Nontender to deep palpation throughout, no suprapubic distention GU:  Normal anatomy, no rash, mild clear urine leaking around Foley catheter.  Bedside ultrasound with inflated Foley catheter bulb and bladder, less than 10 cc urine appreciated.   ED Results / Procedures / Treatments   Labs (all labs ordered are listed, but only abnormal results are displayed) Labs Reviewed  URINALYSIS, COMPLETE (UACMP) WITH MICROSCOPIC - Abnormal; Notable for the following components:      Result Value   Color, Urine STRAW (*)    APPearance CLEAR (*)    Glucose, UA >=500 (*)    Hgb urine dipstick SMALL (*)  Leukocytes,Ua MODERATE (*)    Bacteria, UA RARE (*)    All other components within normal limits  URINE CULTURE     EKG  N/a   RADIOLOGY N/a    PROCEDURES:  Critical Care performed: No  Procedures   MEDICATIONS ORDERED IN ED: Medications  cephALEXin  (KEFLEX ) capsule 500 mg (has no administration in time range)     IMPRESSION / MDM / ASSESSMENT AND PLAN / ED COURSE  I reviewed the triage vital signs and the nursing notes.                              DDX/MDM/AP: Differential diagnosis includes, but is not limited to, catheter malfunction, consider blockage with sediment, considered but doubt UTI, no evidence of urinary obstruction at this time.  Plan: - Will attempt to irrigate Foley - Offered Foley exchange though patient declined given his history of prior difficult Foley placement requiring urology--prefers to allow leakage in the meantime and  follow-up with his urologist in 4 days as already planned  Patient's presentation is most consistent with acute complicated illness / injury requiring diagnostic workup.   ED course below.   Clinical Course as of 03/06/24 1924  Sat Mar 06, 2024  1716 Patient now requesting Foley exchange, will proceed [MM]  1801 We were about to change foley though pt decided last minute he would like to defer  UA sent [MM]  1908 Patient reevaluated, discussed UA findings.  Notes he is having more urine output and potentially slightly less drainage after he inflated his Foley balloon further.  Urine is clear light yellow.  Again denies any suprapubic discomfort, flank pain, fever.  I noted that the pyuria is likely due to colonization of his Foley catheter from which we pulled it though it is possible he may have an underlying UTI.  Offered to follow urine culture as outpatient or empirically starting antibiotics, prefers to start antibiotics.  Has tolerated Keflex  well in the past, will prescribe course of Keflex , add urine culture, and plan to follow-up with his urologist.  ED return precautions in place.  Patient agrees with plan.  Will give first dose of antibiotics in ED. [MM]    Clinical Course User Index [MM] Clarine Ozell LABOR, MD     FINAL CLINICAL IMPRESSION(S) / ED DIAGNOSES   Final diagnoses:  Malfunction of Foley catheter, initial encounter  Pyuria     Rx / DC Orders   ED Discharge Orders          Ordered    cephALEXin  (KEFLEX ) 500 MG capsule  2 times daily        03/06/24 1914             Note:  This document was prepared using Dragon voice recognition software and may include unintentional dictation errors.   Clarine Ozell LABOR, MD 03/06/24 [phone]

## 2024-03-06 NOTE — Discharge Instructions (Addendum)
 We saw no evidence of urinary retention today.  Please follow-up with your urologist as already planned and return to the emergency department with any new or worsening symptoms.  As discussed, you have a possible urinary tract infection, and I have started you on an antibiotic to treat this.  Please follow-up with your urologist as already planned, and return to the emergency department with any new or worsening symptoms.

## 2024-03-06 NOTE — ED Notes (Signed)
 Patient declined a Foley Catheter  change. Patient is leaking urine around the meatus. Urine specimen was collected at the meatus. Urine is clear. Leg bag was changed, stat-lok in place. Dr. Clarine aware.

## 2024-03-06 NOTE — ED Triage Notes (Signed)
 Pt to ED for foley catheter problem. States is leaking urine around foley and also has not seen any output since this AM. He emptied the leg bag this AM and it had and since 0730 no output except a few mL. Not having bladder spasms. Is here because he would like a bladder scan. No pain at all. Ambulatory with cane.

## 2024-03-08 LAB — URINE CULTURE: Culture: >=100000 — AB

## 2024-04-23 ENCOUNTER — Other Ambulatory Visit: Payer: Self-pay | Admitting: Cardiovascular Disease

## 2024-05-14 ENCOUNTER — Other Ambulatory Visit: Payer: Self-pay | Admitting: Cardiovascular Disease
